# Patient Record
Sex: Female | Born: 1950
Health system: Southern US, Community
[De-identification: ages and names within clinical notes are randomized; demographics above are authoritative.]

## PROBLEM LIST (undated history)

## (undated) DIAGNOSIS — G709 Myoneural disorder, unspecified: Secondary | ICD-10-CM

## (undated) DIAGNOSIS — K589 Irritable bowel syndrome without diarrhea: Secondary | ICD-10-CM

## (undated) DIAGNOSIS — H269 Unspecified cataract: Secondary | ICD-10-CM

## (undated) DIAGNOSIS — M810 Age-related osteoporosis without current pathological fracture: Secondary | ICD-10-CM

## (undated) DIAGNOSIS — L309 Dermatitis, unspecified: Secondary | ICD-10-CM

## (undated) DIAGNOSIS — K635 Polyp of colon: Secondary | ICD-10-CM

## (undated) DIAGNOSIS — G8929 Other chronic pain: Secondary | ICD-10-CM

## (undated) DIAGNOSIS — M8580 Other specified disorders of bone density and structure, unspecified site: Secondary | ICD-10-CM

## (undated) DIAGNOSIS — M1990 Unspecified osteoarthritis, unspecified site: Secondary | ICD-10-CM

## (undated) DIAGNOSIS — R519 Headache, unspecified: Secondary | ICD-10-CM

## (undated) DIAGNOSIS — T7840XA Allergy, unspecified, initial encounter: Secondary | ICD-10-CM

## (undated) DIAGNOSIS — F329 Major depressive disorder, single episode, unspecified: Secondary | ICD-10-CM

## (undated) DIAGNOSIS — E785 Hyperlipidemia, unspecified: Secondary | ICD-10-CM

## (undated) DIAGNOSIS — I10 Essential (primary) hypertension: Secondary | ICD-10-CM

## (undated) DIAGNOSIS — K5790 Diverticulosis of intestine, part unspecified, without perforation or abscess without bleeding: Secondary | ICD-10-CM

## (undated) DIAGNOSIS — R51 Headache: Secondary | ICD-10-CM

## (undated) DIAGNOSIS — F32A Depression, unspecified: Secondary | ICD-10-CM

## (undated) DIAGNOSIS — K219 Gastro-esophageal reflux disease without esophagitis: Secondary | ICD-10-CM

## (undated) DIAGNOSIS — Z720 Tobacco use: Secondary | ICD-10-CM

## (undated) DIAGNOSIS — F419 Anxiety disorder, unspecified: Secondary | ICD-10-CM

## (undated) HISTORY — DX: Other specified disorders of bone density and structure, unspecified site: M85.80

## (undated) HISTORY — DX: Unspecified cataract: H26.9

## (undated) HISTORY — PX: OTHER SURGICAL HISTORY: SHX169

## (undated) HISTORY — DX: Depression, unspecified: F32.A

## (undated) HISTORY — PX: BREAST SURGERY: SHX581

## (undated) HISTORY — DX: Myoneural disorder, unspecified: G70.9

## (undated) HISTORY — DX: Hyperlipidemia, unspecified: E78.5

## (undated) HISTORY — PX: TUBAL LIGATION: SHX77

## (undated) HISTORY — DX: Gastro-esophageal reflux disease without esophagitis: K21.9

## (undated) HISTORY — DX: Polyp of colon: K63.5

## (undated) HISTORY — DX: Other chronic pain: G89.29

## (undated) HISTORY — DX: Major depressive disorder, single episode, unspecified: F32.9

## (undated) HISTORY — DX: Unspecified osteoarthritis, unspecified site: M19.90

## (undated) HISTORY — PX: COLONOSCOPY W/ POLYPECTOMY: SHX1380

## (undated) HISTORY — DX: Dermatitis, unspecified: L30.9

## (undated) HISTORY — PX: TOTAL ABDOMINAL HYSTERECTOMY: SHX209

## (undated) HISTORY — DX: Age-related osteoporosis without current pathological fracture: M81.0

## (undated) HISTORY — DX: Diverticulosis of intestine, part unspecified, without perforation or abscess without bleeding: K57.90

## (undated) HISTORY — PX: SKIN CANCER EXCISION: SHX779

## (undated) HISTORY — DX: Headache, unspecified: R51.9

## (undated) HISTORY — DX: Essential (primary) hypertension: I10

## (undated) HISTORY — DX: Anxiety disorder, unspecified: F41.9

## (undated) HISTORY — DX: Irritable bowel syndrome, unspecified: K58.9

## (undated) HISTORY — DX: Tobacco use: Z72.0

## (undated) HISTORY — DX: Headache: R51

## (undated) HISTORY — DX: Allergy, unspecified, initial encounter: T78.40XA

---

## 2011-11-07 ENCOUNTER — Ambulatory Visit (INDEPENDENT_AMBULATORY_CARE_PROVIDER_SITE_OTHER): Payer: Medicare HMO | Admitting: Family Medicine

## 2011-11-07 VITALS — BP 135/77 | HR 86 | Temp 98.8°F | Resp 16 | Ht 63.0 in | Wt 187.0 lb

## 2011-11-07 DIAGNOSIS — E785 Hyperlipidemia, unspecified: Secondary | ICD-10-CM

## 2011-11-07 DIAGNOSIS — J329 Chronic sinusitis, unspecified: Secondary | ICD-10-CM

## 2011-11-07 DIAGNOSIS — R059 Cough, unspecified: Secondary | ICD-10-CM

## 2011-11-07 DIAGNOSIS — G43909 Migraine, unspecified, not intractable, without status migrainosus: Secondary | ICD-10-CM

## 2011-11-07 DIAGNOSIS — R05 Cough: Secondary | ICD-10-CM

## 2011-11-07 MED ORDER — SUMATRIPTAN SUCCINATE 100 MG PO TABS: 100.0000 mg | ORAL_TABLET | ORAL | Status: DC | PRN

## 2011-11-07 MED ORDER — MOXIFLOXACIN HCL 400 MG PO TABS: 400.0000 mg | ORAL_TABLET | Freq: Every day | ORAL | Status: AC

## 2011-11-07 MED ORDER — ATORVASTATIN CALCIUM 10 MG PO TABS: 10.0000 mg | ORAL_TABLET | Freq: Every day | ORAL | Status: DC

## 2011-11-07 NOTE — Progress Notes (Signed)
[facility]:  Office Visit  Chief Complaint:  Chief Complaint  Patient presents with  . Sinusitis    x 2 weeks    HPI: Melinda Peterson is a 61 y.o. female who complains of  2 week h/o sinus congestion, cough non productive, subject fevers. H/o sinus infection. States that she is allergic to Biaxin and strong family h/o PCN allergy so normally does not take that. She usually is given Avelox by her former PCP from [place] where she recently moved from for any sinus infection. Denies ear pain, sore throat. Has not tried OTC treatment. + smoker. She is not allergic to Bactrim or Doxycycline that she knows of.  Past Medical History  Diagnosis Date  . Allergy   . Anxiety   . Eczema   . Irritable bowel syndrome   . Osteopenia   . Hyperlipidemia    Past Surgical History  Procedure Date  . Abdominal hysterectomy   . Left shoulder surgery   . Colonoscopy w/ polypectomy 2009, 2012   History   Social History  . Marital Status: Married    Spouse Name: N/A    Number of Children: N/A  . Years of Education: N/A   Social History Main Topics  . Smoking status: Current Everyday Smoker -- 0.3 packs/day  . Smokeless tobacco: None  . Alcohol Use: None  . Drug Use: None  . Sexually Active: None   Other Topics Concern  . None   Social History Narrative  . None   No family history on file. Allergies  Allergen Reactions  . Biaxin Hives  . Lincocin Hives  . Lisinopril Swelling   Prior to Admission medications   Medication Sig Start Date End Date Taking? Authorizing Provider  allopurinol (ZYLOPRIM) 300 MG tablet Take 300 mg by mouth daily.   Yes Historical Provider, MD  ALPRAZolam Prudy Feeler) 0.5 MG tablet Take 0.5 mg by mouth 3 (three) times daily as needed.   Yes Historical Provider, MD  atorvastatin (LIPITOR) 10 MG tablet Take 10 mg by mouth daily.   Yes Historical Provider, MD  celecoxib (CELEBREX) 200 MG capsule Take 200 mg by mouth as needed.   Yes Historical  Provider, MD  esomeprazole (NEXIUM) 40 MG capsule Take 40 mg by mouth daily before breakfast.   Yes Historical Provider, MD  FLUoxetine (PROZAC) 40 MG capsule Take 40 mg by mouth daily.   Yes Historical Provider, MD  hyoscyamine (HYOMAX-SR) 0.375 MG 12 hr tablet Take 0.375 mg by mouth every 12 (twelve) hours as needed.   Yes Historical Provider, MD  Melatonin 10 MG CAPS Take 10 mg by mouth at bedtime.   Yes Historical Provider, MD  mometasone (NASONEX) 50 MCG/ACT nasal spray Place 2 sprays into the nose daily.   Yes Historical Provider, MD  Multiple Vitamin (MULTIVITAMIN) capsule Take 1 capsule by mouth daily.   Yes Historical Provider, MD  omeprazole (PRILOSEC) 20 MG capsule Take 20 mg by mouth at bedtime.   Yes Historical Provider, MD  SUMAtriptan (IMITREX) 100 MG tablet Take 100 mg by mouth as needed.   Yes Historical Provider, MD  valsartan-hydrochlorothiazide (DIOVAN-HCT) 160-25 MG per tablet Take 1 tablet by mouth daily.   Yes Historical Provider, MD  zolpidem (AMBIEN) 10 MG tablet Take 10 mg by mouth at bedtime as needed.   Yes Historical Provider, MD     ROS: The patient denies fevers, chills, night sweats, unintentional weight loss, chest pain, palpitations, wheezing, dyspnea on exertion, nausea, vomiting, abdominal  pain, dysuria, hematuria, melena, numbness, weakness, or tingling. + sinus pain  All other systems have been reviewed and were otherwise negative with the exception of those mentioned in the HPI and as above.    PHYSICAL EXAM: Filed Vitals:   11/07/11 1705  BP: 135/77  Pulse: 86  Temp: 98.8 F (37.1 C)  Resp: 16   Filed Vitals:   11/07/11 1705  Height: 5\' 3"  (1.6 m)  Weight: 187 lb (84.823 kg)   Body mass index is 33.13 kg/(m^2).  General: Alert, no acute distress HEENT:  Normocephalic, atraumatic, oropharynx patent. TM nl, OP normal, no exudates, + sinus tenderness Cardiovascular:  Regular rate and rhythm, no rubs murmurs or gallops.  No Carotid bruits,  radial pulse intact. No pedal edema.  Respiratory: Clear to auscultation bilaterally.  No wheezes, rales, or rhonchi.  No cyanosis, no use of accessory musculature GI: No organomegaly, abdomen is soft and non-tender, positive bowel sounds.  No masses. Skin: No rashes. Neurologic: Facial musculature symmetric. Psychiatric: Patient is appropriate throughout our interaction.  Lymphatic: No cervical lymphadenopathy Musculoskeletal: Gait intact.   LABS: No results found for this or any previous visit.   EKG/XRAY:   Primary read interpreted by Dr. Conley Rolls at [facility].   ASSESSMENT/PLAN: Encounter Diagnoses  Name Primary?  . Sinusitis Yes  . Cough   . Hyperlipidemia   . Migraines    1. Sxs treatment for cough and allergies with OTC meds, if no improvement then may take Avelox. I was extremely hesitant to give her the Avelox but she was insistent about it. Has Biaxin allergies, strong family h/o PCN allergy so she does not want to take that. We talked about overuse and abuse of abx and she states she understands it but still persistent that Avelox is the only medication that works for her.  2. Refill LIpitor x 30 d with 1 refill, last CMP in October per patient was normal. Just moved from [place]. I emphasized that unless we get records we are unable to refill her Lipitor again after her 2 month supply runs out. She needs to get a PCP within the area to obtain any other medication refills.  3. Refilled migraine medication 3. F/u with Dr. Audria Nine to establish care.    Hamilton Capri PHUONG, DO 11/07/2011 5:41 PM

## 2011-11-29 ENCOUNTER — Ambulatory Visit (INDEPENDENT_AMBULATORY_CARE_PROVIDER_SITE_OTHER): Payer: Medicare HMO | Admitting: Internal Medicine

## 2011-11-29 VITALS — BP 139/83 | HR 93 | Temp 98.1°F | Resp 18 | Ht 63.0 in | Wt 189.0 lb

## 2011-11-29 DIAGNOSIS — R05 Cough: Secondary | ICD-10-CM

## 2011-11-29 DIAGNOSIS — R059 Cough, unspecified: Secondary | ICD-10-CM

## 2011-11-29 DIAGNOSIS — F411 Generalized anxiety disorder: Secondary | ICD-10-CM

## 2011-11-29 DIAGNOSIS — F39 Unspecified mood [affective] disorder: Secondary | ICD-10-CM

## 2011-11-29 DIAGNOSIS — F32A Depression, unspecified: Secondary | ICD-10-CM | POA: Insufficient documentation

## 2011-11-29 DIAGNOSIS — J069 Acute upper respiratory infection, unspecified: Secondary | ICD-10-CM

## 2011-11-29 DIAGNOSIS — N898 Other specified noninflammatory disorders of vagina: Secondary | ICD-10-CM

## 2011-11-29 DIAGNOSIS — S91109A Unspecified open wound of unspecified toe(s) without damage to nail, initial encounter: Secondary | ICD-10-CM

## 2011-11-29 DIAGNOSIS — R300 Dysuria: Secondary | ICD-10-CM

## 2011-11-29 DIAGNOSIS — I10 Essential (primary) hypertension: Secondary | ICD-10-CM

## 2011-11-29 DIAGNOSIS — M109 Gout, unspecified: Secondary | ICD-10-CM | POA: Insufficient documentation

## 2011-11-29 DIAGNOSIS — F329 Major depressive disorder, single episode, unspecified: Secondary | ICD-10-CM | POA: Insufficient documentation

## 2011-11-29 LAB — POCT URINALYSIS DIPSTICK
Leukocytes, UA: NEGATIVE
Nitrite, UA: NEGATIVE
Protein, UA: NEGATIVE
pH, UA: 6.5

## 2011-11-29 LAB — POCT UA - MICROSCOPIC ONLY
Casts, Ur, LPF, POC: NEGATIVE
Crystals, Ur, HPF, POC: NEGATIVE
Mucus, UA: NEGATIVE
Yeast, UA: NEGATIVE

## 2011-11-29 LAB — POCT WET PREP WITH KOH: Clue Cells Wet Prep HPF POC: NEGATIVE

## 2011-11-29 MED ORDER — MUPIROCIN 2 % EX OINT: TOPICAL_OINTMENT | Freq: Three times a day (TID) | CUTANEOUS | Status: AC

## 2011-11-29 MED ORDER — AZITHROMYCIN 250 MG PO TABS: ORAL_TABLET | ORAL | Status: AC

## 2011-11-29 NOTE — Patient Instructions (Signed)
Wound Care Wound care helps prevent pain and infection.  You may need a tetanus shot if:  You cannot remember when you had your last tetanus shot.   You have never had a tetanus shot.   The injury broke your skin.  If you need a tetanus shot and you choose not to have one, you may get tetanus. Sickness from tetanus can be serious. HOME CARE   Only take medicine as told by your doctor.   Clean the wound daily with mild soap and water.   Change any bandages (dressings) as told by your doctor.   Put medicated cream and a bandage on the wound as told by your doctor.   Change the bandage if it gets wet, dirty, or starts to smell.   Take showers. Do not take baths, swim, or do anything that puts your wound under water.   Rest and raise (elevate) the wound until the pain and puffiness (swelling) are better.   Keep all doctor visits as told.  GET HELP RIGHT AWAY IF:   Yellowish-white fluid (pus) comes from the wound.   Medicine does not lessen your pain.   There is a red streak going away from the wound.   You cannot move your finger or toe.   You have a fever.  MAKE SURE YOU:   Understand these instructions.   Will watch your condition.   Will get help right away if you are not doing well or get worse.  Document Released: 05/08/2008 Document Revised: 07/19/2011 Document Reviewed: 12/03/2010 [employer] Patient Information 2012 [employer]. 

## 2011-11-29 NOTE — Progress Notes (Signed)
  Subjective:    Patient ID: Melinda Peterson, female    DOB: 1951-02-21, 61 y.o.   MRN: [id number]  HPI Has pressure over bladder and scant vag. Discharge. No fever, is traveling and does not want to be sick. Also caught 5th toe on chair leg and nail avulsed dorsal with bleeding.   Review of Systems complex, see meds and problem list.   Objective:   Physical Exam 5th toe wound with nail avulsion Tender over bladder No other issues Results for orders placed in visit on 11/29/11  POCT URINALYSIS DIPSTICK      Component Value Range   Color, UA yellow     Clarity, UA clear     Glucose, UA neg     Bilirubin, UA neg     Ketones, UA neg     Spec Grav, UA 1.010     Blood, UA neg     pH, UA 6.5     Protein, UA neg     Urobilinogen, UA 0.2     Nitrite, UA neg     Leukocytes, UA Negative    POCT UA - MICROSCOPIC ONLY      Component Value Range   WBC, Ur, HPF, POC neg     RBC, urine, microscopic neg     Bacteria, U Microscopic neg     Mucus, UA neg     Epithelial cells, urine per micros 0-1     Crystals, Ur, HPF, POC neg     Casts, Ur, LPF, POC neg     Yeast, UA neg    POCT WET PREP WITH KOH      Component Value Range   Trichomonas, UA Negative     Clue Cells Wet Prep HPF POC neg     Epithelial Wet Prep HPF POC 10-20     Yeast Wet Prep HPF POC neg     Bacteria Wet Prep HPF POC 2+     RBC Wet Prep HPF POC 1-2     WBC Wet Prep HPF POC 0-1     KOH Prep POC Negative           Assessment & Plan:  Wound cleaned mupirocin and covered. Wet prep and CCUA are both normal Zpak

## 2011-12-20 ENCOUNTER — Ambulatory Visit (INDEPENDENT_AMBULATORY_CARE_PROVIDER_SITE_OTHER): Payer: Self-pay | Admitting: Family Medicine

## 2011-12-20 VITALS — BP 147/91 | HR 106 | Temp 98.3°F | Resp 20 | Ht 63.0 in | Wt 187.8 lb

## 2011-12-20 DIAGNOSIS — E785 Hyperlipidemia, unspecified: Secondary | ICD-10-CM

## 2011-12-20 DIAGNOSIS — K7689 Other specified diseases of liver: Secondary | ICD-10-CM

## 2011-12-20 DIAGNOSIS — K769 Liver disease, unspecified: Secondary | ICD-10-CM

## 2011-12-20 DIAGNOSIS — E66811 Obesity, class 1: Secondary | ICD-10-CM

## 2011-12-20 DIAGNOSIS — J329 Chronic sinusitis, unspecified: Secondary | ICD-10-CM

## 2011-12-20 DIAGNOSIS — F418 Other specified anxiety disorders: Secondary | ICD-10-CM

## 2011-12-20 DIAGNOSIS — I10 Essential (primary) hypertension: Secondary | ICD-10-CM

## 2011-12-20 DIAGNOSIS — M5030 Other cervical disc degeneration, unspecified cervical region: Secondary | ICD-10-CM

## 2011-12-20 DIAGNOSIS — K5730 Diverticulosis of large intestine without perforation or abscess without bleeding: Secondary | ICD-10-CM

## 2011-12-20 DIAGNOSIS — E669 Obesity, unspecified: Secondary | ICD-10-CM

## 2011-12-20 DIAGNOSIS — K589 Irritable bowel syndrome without diarrhea: Secondary | ICD-10-CM

## 2011-12-20 DIAGNOSIS — F341 Dysthymic disorder: Secondary | ICD-10-CM

## 2011-12-20 DIAGNOSIS — K219 Gastro-esophageal reflux disease without esophagitis: Secondary | ICD-10-CM

## 2011-12-20 DIAGNOSIS — Z760 Encounter for issue of repeat prescription: Secondary | ICD-10-CM

## 2011-12-20 MED ORDER — HYOSCYAMINE SULFATE ER 0.375 MG PO TB12: 0.3750 mg | ORAL_TABLET | Freq: Two times a day (BID) | ORAL | Status: DC | PRN

## 2011-12-20 MED ORDER — ZOLPIDEM TARTRATE 10 MG PO TABS: 10.0000 mg | ORAL_TABLET | Freq: Every evening | ORAL | Status: DC | PRN

## 2011-12-20 MED ORDER — VALSARTAN-HYDROCHLOROTHIAZIDE 160-25 MG PO TABS: 1.0000 | ORAL_TABLET | Freq: Every day | ORAL | Status: DC

## 2011-12-20 MED ORDER — ALPRAZOLAM 0.5 MG PO TABS: 0.5000 mg | ORAL_TABLET | Freq: Three times a day (TID) | ORAL | Status: DC | PRN

## 2011-12-20 MED ORDER — SULFAMETHOXAZOLE-TRIMETHOPRIM 800-160 MG PO TABS: 1.0000 | ORAL_TABLET | Freq: Two times a day (BID) | ORAL | Status: AC

## 2011-12-20 MED ORDER — ATORVASTATIN CALCIUM 10 MG PO TABS: 10.0000 mg | ORAL_TABLET | Freq: Every day | ORAL | Status: DC

## 2011-12-20 NOTE — Patient Instructions (Signed)
Allergic Rhinitis  Allergic rhinitis is when the mucous membranes in the nose respond to allergens. Allergens are particles in the air that cause your body to have an allergic reaction. This causes you to release allergic antibodies. Through a chain of events, these eventually cause you to release histamine into the blood stream (hence the use of antihistamines). Although meant to be protective to the body, it is this release that causes your discomfort, such as frequent sneezing, congestion and an itchy runny nose.    CAUSES    The pollen allergens may come from grasses, trees, and weeds. This is seasonal allergic rhinitis, or "hay fever." Other allergens cause year-round allergic rhinitis (perennial allergic rhinitis) such as house dust mite allergen, pet dander and mold spores.    SYMPTOMS     Nasal stuffiness (congestion).   Runny, itchy nose with sneezing and tearing of the eyes.   There is often an itching of the mouth, eyes and ears.  It cannot be cured, but it can be controlled with medications.  DIAGNOSIS    If you are unable to determine the offending allergen, skin or blood testing may find it.  TREATMENT     Avoid the allergen.   Medications and allergy shots (immunotherapy) can help.   Hay fever may often be treated with antihistamines in pill or nasal spray forms. Antihistamines block the effects of histamine. There are over-the-counter medicines that may help with nasal congestion and swelling around the eyes. Check with your caregiver before taking or giving this medicine.  If the treatment above does not work, there are many new medications your caregiver can prescribe. Stronger medications may be used if initial measures are ineffective. Desensitizing injections can be used if medications and avoidance fails. Desensitization is when a patient is given ongoing shots until the body becomes less sensitive to the allergen. Make sure you follow up with your caregiver if problems continue.  SEEK  MEDICAL CARE IF:     You develop fever (more than 100.5 F (38.1 C).   You develop a cough that does not stop easily (persistent).   You have shortness of breath.   You start wheezing.   Symptoms interfere with normal daily activities.  Document Released: 04/24/2001 Document Revised: 07/19/2011 Document Reviewed: 11/03/2008  [employer] Patient Information 2012 [employer].

## 2011-12-21 LAB — COMPREHENSIVE METABOLIC PANEL
ALT: 18 U/L (ref 0–35)
CO2: 24 mEq/L (ref 19–32)
Calcium: 9.6 mg/dL (ref 8.4–10.5)
Chloride: 103 mEq/L (ref 96–112)
Sodium: 139 mEq/L (ref 135–145)
Total Bilirubin: 0.5 mg/dL (ref 0.3–1.2)
Total Protein: 6.8 g/dL (ref 6.0–8.3)

## 2011-12-23 NOTE — Progress Notes (Signed)
Quick Note:  Please notify pt that results are normal.   Provide pt with copy of labs. ______ 

## 2011-12-24 ENCOUNTER — Encounter: Payer: Self-pay | Admitting: Radiology

## 2011-12-26 ENCOUNTER — Encounter: Payer: Self-pay | Admitting: Family Medicine

## 2011-12-26 DIAGNOSIS — M5030 Other cervical disc degeneration, unspecified cervical region: Secondary | ICD-10-CM | POA: Insufficient documentation

## 2011-12-26 DIAGNOSIS — E669 Obesity, unspecified: Secondary | ICD-10-CM | POA: Insufficient documentation

## 2011-12-26 DIAGNOSIS — K219 Gastro-esophageal reflux disease without esophagitis: Secondary | ICD-10-CM | POA: Insufficient documentation

## 2011-12-26 DIAGNOSIS — K589 Irritable bowel syndrome without diarrhea: Secondary | ICD-10-CM | POA: Insufficient documentation

## 2011-12-26 DIAGNOSIS — K5730 Diverticulosis of large intestine without perforation or abscess without bleeding: Secondary | ICD-10-CM | POA: Insufficient documentation

## 2011-12-26 NOTE — Progress Notes (Signed)
Subjective:    Patient ID: Melinda Peterson, female    DOB: 11/26/1950, 61 y.o.   MRN: [id number]  HPI  This 61 y.o. Cauc female has bee seen twice at [facility] and is here at 89 today to establish primary  care, have medications reviewed and follow-up on sinus symptoms (pt is sure she has another sinus infection  She also wants to discuss some chronic medical issues that need further evaluation but are being postponed  due to financial constraints.These problems were initially diagnosed in [place]. Pt's mother recently died in 2023/01/07  and the pt is working through financial issues with sister and other family members.          She has has chronic abdominal pain, initially evaluated with CT 11/03/2010. MRI done Jan 07, 2011 shows  largest lesion probably is a benign liver lesion such as hemangioma but it is unclear what the right lobe  lesions are; she would like to get another MRI when she can afford it. She also has chronic neck pain and C-spine imaging  shows DDD/ disc protrusion. She also has chronic right knee pain and xray done 08/2010 shows no abnormality.    Review of Systems  Constitutional: Positive for fatigue. Negative for fever, activity change, appetite change and unexpected weight change.  HENT: Positive for congestion, rhinorrhea, sneezing, neck pain and sinus pressure. Negative for nosebleeds, sore throat, voice change and postnasal drip.   Eyes: Positive for itching. Negative for redness.  Respiratory: Positive for shortness of breath. Negative for cough, chest tightness and wheezing.   Cardiovascular: Negative for chest pain.  Gastrointestinal:       Chronic GI symptoms/ Diverticulosis, colon polyps, hemorrhoids, IBS and GERD  Musculoskeletal: Positive for back pain and arthralgias.  Neurological: Positive for headaches. Negative for dizziness, weakness and light-headedness.  Psychiatric/Behavioral: Positive for dysphoric mood. The patient is nervous/anxious.        Objective:   Physical Exam  Nursing note and vitals reviewed. Constitutional: She is oriented to person, place, and time. She appears well-developed and well-nourished. No distress.  HENT:  Head: Normocephalic and atraumatic.  Right Ear: External ear normal.  Left Ear: External ear normal.  Nose: Nose normal.  Mouth/Throat: Oropharynx is clear and moist. No oropharyngeal exudate.       Mild erythema of posterior pharynx;  Sinuses nontender with percusiion  Eyes: EOM are normal. Pupils are equal, round, and reactive to light. Right eye exhibits no discharge. Left eye exhibits no discharge. No scleral icterus.       Conj injected  Neck: No thyromegaly present.       C-spine: decreased ROM  Cardiovascular: Normal rate.   Pulmonary/Chest: Effort normal and breath sounds normal. No respiratory distress. She has no wheezes.       BS distant at bases  Musculoskeletal: Normal range of motion.  Lymphadenopathy:    She has no cervical adenopathy.  Neurological: She is alert and oriented to person, place, and time. No cranial nerve deficit.  Skin: Skin is warm and dry. No erythema. No pallor.  Psychiatric: Her behavior is normal. Thought content normal.       Very talkative and tangential (wanting to discuss issues related to  Mother's death and strife within the family)- gets mildly  agitated with this discussion          Assessment & Plan:   1. Hyperlipidemia  Continue current statin (Atorvastatin)  2. HTN (hypertension) - Controlled Comprehensive metabolic panel RF: Diovan HCT (generic); weight reduction  3. Liver lesion  CMET: will discuss repeat MRI imaging in future when pt can afford it  4. Medication refill  See medication list  5. Chronic sinusitis  RX: Septra DS  1 tab bid for 10 days; Nasonex Try NETI pot  6. Depression with anxiety  RF: Alprazolam; continue Fluoxetine and Zolpidem

## 2012-01-04 ENCOUNTER — Encounter: Payer: Self-pay | Admitting: Family Medicine

## 2012-01-04 ENCOUNTER — Ambulatory Visit (INDEPENDENT_AMBULATORY_CARE_PROVIDER_SITE_OTHER): Payer: 59 | Admitting: Family Medicine

## 2012-01-04 VITALS — BP 99/73 | HR 100 | Temp 98.7°F | Resp 18 | Ht 63.0 in | Wt 188.0 lb

## 2012-01-04 DIAGNOSIS — F411 Generalized anxiety disorder: Secondary | ICD-10-CM

## 2012-01-04 DIAGNOSIS — J329 Chronic sinusitis, unspecified: Secondary | ICD-10-CM

## 2012-01-04 DIAGNOSIS — Z760 Encounter for issue of repeat prescription: Secondary | ICD-10-CM

## 2012-01-04 DIAGNOSIS — F419 Anxiety disorder, unspecified: Secondary | ICD-10-CM

## 2012-01-04 MED ORDER — CELECOXIB 200 MG PO CAPS: 200.0000 mg | ORAL_CAPSULE | ORAL | Status: DC | PRN

## 2012-01-04 MED ORDER — HYOSCYAMINE SULFATE ER 0.375 MG PO TB12: 0.3750 mg | ORAL_TABLET | Freq: Two times a day (BID) | ORAL | Status: DC | PRN

## 2012-01-04 MED ORDER — LEVOFLOXACIN 500 MG PO TABS: 500.0000 mg | ORAL_TABLET | Freq: Every day | ORAL | Status: AC

## 2012-01-04 NOTE — Progress Notes (Signed)
  Subjective:    Patient ID: Melinda Peterson, female    DOB: 05/30/1951, 61 y.o.   MRN: [id number]  HPI  Pt returns after finishing 10-day antibiotic (Generic Septra DS). She was briefly improved but stress  with family matters surrounding mother's death negatively impacted health. She has tried to improve  sleep hygiene to 5-6 hours/night. Having sinus congestion, low-grade fever and chills with non-productive  cough. Has not used NETI pot yet. She was treated with Avelox in March and Septra DS 2 weeks ago.   She has several antibiotic-class allergies.     Review of Systems    As per HPI     Objective:   Physical Exam  Vitals reviewed. Constitutional: She is oriented to person, place, and time. She appears well-developed and well-nourished. No distress.  HENT:  Head: Normocephalic and atraumatic.  Right Ear: External ear normal.  Left Ear: External ear normal.  Mouth/Throat: No oropharyngeal exudate.       Nasal turbinates and post pharynx erythematous  Eyes: Conjunctivae and EOM are normal. No scleral icterus.  Neck: Neck supple. No thyromegaly present.  Cardiovascular: Normal rate.   Pulmonary/Chest: Effort normal. No respiratory distress.  Lymphadenopathy:    She has no cervical adenopathy.  Neurological: She is alert and oriented to person, place, and time.  Skin: Skin is warm and dry.  Psychiatric: Thought content normal. Her mood appears anxious. Her affect is not angry and not labile. Her speech is rapid and/or pressured. Cognition and memory are normal.          Assessment & Plan:   1. Sinusitis, chronic    Pt has several antibiotic-class allergies; RX: Levaquin 500 mg  1 tablet once daily for 10 days  2. Chronic anxiety    Pt continues to have stress as she copes with family/ financial issues and mother's death  3. Medication refill    Pt requests RFs: Hyoscyamine and Celebrex

## 2012-01-04 NOTE — Patient Instructions (Signed)
Levaquin 500 mg  1 tablet daily has been prescribed for sinusitis. It is important that you try the other measures advised to prevent recurrence of infection. You may also consider using a different antihistamine (OTC Zyrtec or Allegra) for chronic  allergies.       Sinusitis Sinuses are air pockets within the bones of your face. The growth of bacteria within a sinus leads to infection. The infection prevents the sinuses from draining. This infection is called sinusitis. SYMPTOMS  There will be different areas of pain depending on which sinuses have become infected.  The maxillary sinuses often produce pain beneath the eyes.   Frontal sinusitis may cause pain in the middle of the forehead and above the eyes.  Other problems (symptoms) include:  Toothaches.   Colored, pus-like (purulent) drainage from the nose.   Swelling, warmth, and tenderness over the sinus areas may be signs of infection.  TREATMENT  Sinusitis is most often determined by an exam.X-rays may be taken. If x-rays have been taken, make sure you obtain your results or find out how you are to obtain them. Your caregiver may give you medications (antibiotics). These are medications that will help kill the bacteria causing the infection. You may also be given a medication (decongestant) that helps to reduce sinus swelling.  HOME CARE INSTRUCTIONS   Only take over-the-counter or prescription medicines for pain, discomfort, or fever as directed by your caregiver.   Drink extra fluids. Fluids help thin the mucus so your sinuses can drain more easily.   Applying either moist heat or ice packs to the sinus areas may help relieve discomfort.   Use saline nasal sprays to help moisten your sinuses. The sprays can be found at your local drugstore.  SEEK IMMEDIATE MEDICAL CARE IF:  You have a fever.   You have increasing pain, severe headaches, or toothache.   You have nausea, vomiting, or drowsiness.   You develop unusual  swelling around the face or trouble seeing.  MAKE SURE YOU:   Understand these instructions.   Will watch your condition.   Will get help right away if you are not doing well or get worse.  Document Released: 07/30/2005 Document Revised: 07/19/2011 Document Reviewed: 02/26/2007 [facility] Patient Information 2012 [place], [place].

## 2012-01-06 ENCOUNTER — Encounter: Payer: Self-pay | Admitting: Family Medicine

## 2012-01-06 DIAGNOSIS — J329 Chronic sinusitis, unspecified: Secondary | ICD-10-CM | POA: Insufficient documentation

## 2012-02-04 ENCOUNTER — Other Ambulatory Visit: Payer: Self-pay | Admitting: Family Medicine

## 2012-02-04 MED ORDER — ALPRAZOLAM 0.5 MG PO TABS: 0.5000 mg | ORAL_TABLET | Freq: Three times a day (TID) | ORAL | Status: DC | PRN

## 2012-02-17 ENCOUNTER — Other Ambulatory Visit: Payer: Self-pay | Admitting: Family Medicine

## 2012-02-26 ENCOUNTER — Ambulatory Visit (INDEPENDENT_AMBULATORY_CARE_PROVIDER_SITE_OTHER): Payer: 59 | Admitting: Family Medicine

## 2012-02-26 ENCOUNTER — Encounter: Payer: Self-pay | Admitting: Family Medicine

## 2012-02-26 VITALS — BP 133/93 | HR 99 | Temp 98.5°F | Resp 16 | Ht 62.5 in | Wt 194.0 lb

## 2012-02-26 DIAGNOSIS — F418 Other specified anxiety disorders: Secondary | ICD-10-CM

## 2012-02-26 DIAGNOSIS — M6530 Trigger finger, unspecified finger: Secondary | ICD-10-CM

## 2012-02-26 DIAGNOSIS — D1803 Hemangioma of intra-abdominal structures: Secondary | ICD-10-CM

## 2012-02-26 DIAGNOSIS — G43909 Migraine, unspecified, not intractable, without status migrainosus: Secondary | ICD-10-CM

## 2012-02-26 DIAGNOSIS — M25519 Pain in unspecified shoulder: Secondary | ICD-10-CM

## 2012-02-26 DIAGNOSIS — F341 Dysthymic disorder: Secondary | ICD-10-CM

## 2012-02-26 DIAGNOSIS — M25512 Pain in left shoulder: Secondary | ICD-10-CM

## 2012-02-26 DIAGNOSIS — M65332 Trigger finger, left middle finger: Secondary | ICD-10-CM

## 2012-02-26 MED ORDER — ZOLPIDEM TARTRATE 10 MG PO TABS: 10.0000 mg | ORAL_TABLET | Freq: Every evening | ORAL | Status: DC | PRN

## 2012-02-26 MED ORDER — SUMATRIPTAN SUCCINATE 100 MG PO TABS: 100.0000 mg | ORAL_TABLET | ORAL | Status: DC | PRN

## 2012-02-26 MED ORDER — ALPRAZOLAM 0.5 MG PO TABS: 0.5000 mg | ORAL_TABLET | Freq: Three times a day (TID) | ORAL | Status: DC | PRN

## 2012-02-26 MED ORDER — FLUOXETINE HCL 40 MG PO CAPS: 40.0000 mg | ORAL_CAPSULE | Freq: Every day | ORAL | Status: DC

## 2012-02-26 NOTE — Progress Notes (Signed)
Subjective:    Patient ID: Melinda Peterson, female    DOB: October 19, 1950, 61 y.o.   MRN: [id number]  HPI  This 61 y.o. Cauc female has several chronic medical problems and is a self-proclaimed "hypochondriac".  Sh is here today to have medications refilled and to have some sub-acute issues addressed. Family is still dealing with  issues pertaining to death of her mother; final autopsy report is pending.   Pt had left shoulder surgery ("acromioclavicular plasty") in 2006- 2007. Pt was sent to rehab but did not complete treatment.  (Not interested in P.T. at this time.)  Has decreased ROM and pain. Woke up hurting for the last 4-6 weeks.  Heat helps a little. Also has had eval by Neurologist in March 2011 and will probably need MRI of neck in near future.   C/o left middle finger popping and catching like a "trigger finger". Has a hx of CTS years ago.  Takes Celebrex almost daily with food (did not take last week because of GI upset).   Also had problems with stomach last week; has IBS which flares up with nausea and constipation. Last 6 weeks,  pt has been consuming a lot of sweets. Now trying to get nutrition back on tract.   Had an abnormal CT abdomen in 2012; wants to proceed with more specific imaging (MRI suggested by M.D. last year) to  evaluate questionable hemangioma seen in liver.   Migraine disorder associated with chronic neck problems. "Imitrex is a wonder drug".  Review of Systems  Constitutional: Positive for fatigue. Negative for fever, chills and diaphoresis.  HENT: Positive for congestion, neck pain, neck stiffness and sinus pressure. Negative for sore throat and trouble swallowing.   Cardiovascular: Negative.   Gastrointestinal: Positive for nausea, abdominal pain and diarrhea. Negative for vomiting, constipation and blood in stool.  Musculoskeletal: Positive for arthralgias.  Neurological: Negative.   Psychiatric/Behavioral: Positive for disturbed wake/sleep cycle. The  patient is nervous/anxious.    As per HPI     Objective:   Physical Exam  Nursing note and vitals reviewed. Constitutional: She is oriented to person, place, and time. She appears well-developed and well-nourished. No distress.  HENT:  Head: Normocephalic and atraumatic.  Eyes: Conjunctivae and EOM are normal. No scleral icterus.  Neck: Spinous process tenderness and muscular tenderness present. Decreased range of motion present.  Cardiovascular: Normal rate.   Pulmonary/Chest: Effort normal. No respiratory distress.  Musculoskeletal:       Left shoulder: She exhibits decreased range of motion, tenderness, crepitus and pain. She exhibits no bony tenderness, no swelling, no effusion, no deformity, no spasm and normal strength.       Hands: normal ROM in wrists and digits; left middle finger is minimally tender at PIP joint; no redness or effusion. No trigger action noted on exam today.  Neurological: She is alert and oriented to person, place, and time. No cranial nerve deficit. Coordination normal.  Skin: Skin is warm and dry. No erythema.  Psychiatric: She has a normal mood and affect. Her behavior is normal.          Assessment & Plan:   1. Shoulder pain, left  Continue conservative treatment; avoid heavy lifting or overuse.  2. Trigger middle finger of left hand  Monitor- not severe enough to warrant referral to Ortho/ Hand Specialist  3. Depression with anxiety - Mood disorder Continue current medications  4. Hemangioma of liver  MR Abdomen W Contrast  5. Migraines  SUMAtriptan (IMITREX) 100 MG  tablet; minimize triggers ( family stress and chronic anxiety + poor sleep hygiene)

## 2012-02-29 NOTE — Addendum Note (Signed)
Addended by: Thelma Barge D on: 02/29/2012 10:52 AM   Modules accepted: Orders

## 2012-04-01 ENCOUNTER — Other Ambulatory Visit: Payer: Self-pay | Admitting: Family Medicine

## 2012-04-02 NOTE — Telephone Encounter (Signed)
Ok through 06/2012?

## 2012-04-23 ENCOUNTER — Other Ambulatory Visit: Payer: Self-pay | Admitting: Family Medicine

## 2012-04-23 MED ORDER — ALPRAZOLAM 0.5 MG PO TABS: 0.5000 mg | ORAL_TABLET | Freq: Three times a day (TID) | ORAL | Status: DC | PRN

## 2012-04-25 ENCOUNTER — Telehealth: Payer: Self-pay

## 2012-04-25 MED ORDER — VALSARTAN-HYDROCHLOROTHIAZIDE 160-25 MG PO TABS: 1.0000 | ORAL_TABLET | Freq: Every day | ORAL | Status: DC

## 2012-04-25 NOTE — Telephone Encounter (Signed)
Pharmacy called to ask for RF of pt's valsrtn/HCTZ. I asked them to send through Surescripts and they stated they would resend, but we did not receive it. I am sending in RFs for pt.

## 2012-04-30 ENCOUNTER — Telehealth: Payer: Self-pay

## 2012-04-30 ENCOUNTER — Ambulatory Visit (INDEPENDENT_AMBULATORY_CARE_PROVIDER_SITE_OTHER): Payer: 59 | Admitting: Family Medicine

## 2012-04-30 VITALS — BP 164/90 | HR 89 | Temp 98.2°F | Resp 20 | Ht 63.5 in | Wt 192.0 lb

## 2012-04-30 DIAGNOSIS — F341 Dysthymic disorder: Secondary | ICD-10-CM

## 2012-04-30 DIAGNOSIS — Z0000 Encounter for general adult medical examination without abnormal findings: Secondary | ICD-10-CM

## 2012-04-30 DIAGNOSIS — Z01419 Encounter for gynecological examination (general) (routine) without abnormal findings: Secondary | ICD-10-CM

## 2012-04-30 DIAGNOSIS — Z124 Encounter for screening for malignant neoplasm of cervix: Secondary | ICD-10-CM

## 2012-04-30 DIAGNOSIS — F418 Other specified anxiety disorders: Secondary | ICD-10-CM

## 2012-04-30 DIAGNOSIS — N63 Unspecified lump in unspecified breast: Secondary | ICD-10-CM

## 2012-04-30 DIAGNOSIS — N6320 Unspecified lump in the left breast, unspecified quadrant: Secondary | ICD-10-CM

## 2012-04-30 DIAGNOSIS — J019 Acute sinusitis, unspecified: Secondary | ICD-10-CM

## 2012-04-30 DIAGNOSIS — E785 Hyperlipidemia, unspecified: Secondary | ICD-10-CM

## 2012-04-30 LAB — POCT URINALYSIS DIPSTICK
Bilirubin, UA: NEGATIVE
Blood, UA: NEGATIVE
Glucose, UA: NEGATIVE
Spec Grav, UA: 1.015
pH, UA: 6

## 2012-04-30 LAB — CBC WITH DIFFERENTIAL/PLATELET
Basophils Absolute: 0 10*3/uL (ref 0.0–0.1)
Basophils Relative: 0 % (ref 0–1)
Eosinophils Absolute: 0.3 10*3/uL (ref 0.0–0.7)
MCH: 29.5 pg (ref 26.0–34.0)
MCHC: 33.2 g/dL (ref 30.0–36.0)
Neutrophils Relative %: 50 % (ref 43–77)
Platelets: 339 10*3/uL (ref 150–400)
RBC: 4.1 MIL/uL (ref 3.87–5.11)

## 2012-04-30 LAB — COMPREHENSIVE METABOLIC PANEL
ALT: 20 U/L (ref 0–35)
Alkaline Phosphatase: 105 U/L (ref 39–117)
Sodium: 140 mEq/L (ref 135–145)
Total Bilirubin: 0.6 mg/dL (ref 0.3–1.2)
Total Protein: 6.5 g/dL (ref 6.0–8.3)

## 2012-04-30 LAB — LIPID PANEL
LDL Cholesterol: 71 mg/dL (ref 0–99)
VLDL: 60 mg/dL — ABNORMAL HIGH (ref 0–40)

## 2012-04-30 MED ORDER — ATORVASTATIN CALCIUM 10 MG PO TABS: 10.0000 mg | ORAL_TABLET | Freq: Every day | ORAL | Status: DC

## 2012-04-30 MED ORDER — ZOLPIDEM TARTRATE 10 MG PO TABS: 10.0000 mg | ORAL_TABLET | Freq: Every evening | ORAL | Status: DC | PRN

## 2012-04-30 MED ORDER — LEVOFLOXACIN 500 MG PO TABS: 500.0000 mg | ORAL_TABLET | Freq: Every day | ORAL | Status: DC

## 2012-05-01 ENCOUNTER — Encounter: Payer: Self-pay | Admitting: Family Medicine

## 2012-05-01 NOTE — Progress Notes (Signed)
Subjective:    Patient ID: Melinda Peterson, female    DOB: 1951-04-27, 61 y.o.   MRN: [id number]  HPI This 61 y.o. Cauc female is here for CPE/PAP ( last PAP Oct 2102 per pt). She has multiple   chronic medical issues but we will address only the acute problems today and briefly review any  issues that may need action today.   Pt needs MMG; has a hx of breast mass with last study in [place].   Last DEXA: Oct 2102 in [place]   Last CRS: Feb 2012 to recheck polyps (normal)   Pt has a hepatic lesion and was supposed to have a follow-up MRI of liver; she did not have this done because of family obligations/ wedding.   She has HTN and feels like BP has been up lately due to ongoing family stress surrounding  mother's death. Still has not "shed a tear" and acknowledges that she is not grieving; trying to  handle dysfunctional family relationships- "I am the one that tries to be strong for everyone else".   Recent wedding in family had siblings together with less than pleasant outcome (conflicts surfaced).    Pt was in [facility] for above-mentioned wedding and now has increased allergy symptoms  related to wet pine needles. She has "another sinus infection". Levaquin worked well for her last  time and she is requesting this med again.     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 She is a smoker and a social drinker.    Review of Systems  Constitutional: Positive for fever, chills and fatigue. Negative for diaphoresis, activity change and unexpected weight change.  HENT: Positive for rhinorrhea, sneezing, neck pain, neck stiffness, dental problem and sinus pressure. Negative for nosebleeds, congestion, sore throat and voice change.   Eyes: Positive for pain and itching.  Respiratory: Positive for shortness of breath. Negative for cough, chest tightness and wheezing.   Cardiovascular: Positive for palpitations and leg swelling.       She attributes Palpitations to "stress"    Gastrointestinal: Positive for nausea, abdominal pain, diarrhea, constipation and abdominal distention.       Pt has IBS  Genitourinary: Negative.   Musculoskeletal: Positive for myalgias, back pain, joint swelling, arthralgias and gait problem.  Skin: Positive for rash.       Has eczema  Neurological: Positive for dizziness, weakness, light-headedness and headaches. Negative for tremors, syncope and numbness.       Weakness in hands only  Hematological: Bruises/bleeds easily.  Psychiatric/Behavioral: Positive for disturbed wake/sleep cycle, dysphoric mood and decreased concentration. Negative for suicidal ideas, hallucinations, confusion and agitation. The patient is nervous/anxious.        Objective:   Physical Exam  Nursing note and vitals reviewed. Constitutional: She is oriented to person, place, and time. Vital signs are normal. She appears well-developed and well-nourished. No distress.       Appears older than stated age  HENT:  Head: Normocephalic and atraumatic.  Right Ear: Hearing, tympanic membrane, external ear and ear canal normal. Tympanic membrane is not scarred. No middle ear effusion.  Left Ear: Hearing, tympanic membrane and ear canal normal. Tympanic membrane is not scarred.  No middle ear effusion.  Nose: No mucosal edema, rhinorrhea, nasal deformity or septal deviation. Right sinus exhibits maxillary sinus tenderness. Right sinus exhibits no frontal sinus tenderness. Left sinus exhibits maxillary sinus tenderness. Left sinus exhibits no frontal sinus tenderness.  Mouth/Throat: Uvula is midline and mucous membranes are normal.  No oral lesions. Normal dentition. No uvula swelling. Posterior oropharyngeal erythema present. No oropharyngeal exudate.  Eyes: Conjunctivae normal, EOM and lids are normal. Pupils are equal, round, and reactive to light. No scleral icterus.  Neck: Normal range of motion. Neck supple. No JVD present. No thyromegaly present.  Cardiovascular:  Normal rate, regular rhythm and normal heart sounds.  Exam reveals no gallop and no friction rub.   No murmur heard.      Distal pulses 1+/=  Pulmonary/Chest: Effort normal and breath sounds normal. No respiratory distress. She has no wheezes. Right breast exhibits no inverted nipple, no mass, no nipple discharge, no skin change and no tenderness. Left breast exhibits no inverted nipple, no mass, no nipple discharge, no skin change and no tenderness. Breasts are symmetrical.  Abdominal: Normal appearance. She exhibits distension. She exhibits no abdominal bruit, no ascites, no pulsatile midline mass and no mass. Bowel sounds are decreased. There is no hepatosplenomegaly. There is tenderness in the epigastric area and left upper quadrant. There is no rigidity, no guarding and no CVA tenderness. Hernia confirmed negative in the ventral area.  Genitourinary: Vagina normal. Rectal exam shows internal hemorrhoid. Rectal exam shows no external hemorrhoid, no fissure, no mass and anal tone normal. Guaiac negative stool. There is no rash, tenderness or lesion on the right labia. There is no rash, tenderness or lesion on the left labia. Right adnexum displays no mass and no tenderness. Left adnexum displays no mass and no tenderness. No erythema or tenderness around the vagina. No vaginal discharge found.       Cervix and uterus absent; vaginal cuff intact  Musculoskeletal: Normal range of motion. She exhibits no tenderness.       Trace pedal edema; mild deg changes in hands/wrists, knees and ankles.  Lymphadenopathy:    She has no cervical adenopathy.       Right: No inguinal adenopathy present.       Left: No inguinal adenopathy present.  Neurological: She is alert and oriented to person, place, and time. She has normal strength. No cranial nerve deficit or sensory deficit. Coordination and gait normal.  Reflex Scores:      Tricep reflexes are 1+ on the right side and 1+ on the left side.      Bicep  reflexes are 1+ on the right side and 1+ on the left side.      Patellar reflexes are 1+ on the right side and 1+ on the left side. Skin: Skin is warm and dry. Rash noted. No erythema.       Rash c/w eczema; dry skin texture noted  Psychiatric: Judgment and thought content normal. Her mood appears anxious. Her affect is not labile. Her speech is tangential. Her speech is not rapid and/or pressured. She is agitated and is hyperactive. She is not actively hallucinating. Cognition and memory are normal. She exhibits a depressed mood. She expresses no suicidal ideation. She is attentive.    ECG: NSR; no significant ST-TW chnages. No ectopy.      Assessment & Plan:   1. Routine general medical examination at a health care facility - Healthy Living Guidelines given to pt CBC with Differential, Comprehensive metabolic panel, Lipid panel, TSH, Vitamin D 25 hydroxy  2. Encounter for cervical Pap smear with pelvic exam  Pap IG (Image Guided)  3. Depression with anxiety - major source of stress is family dynamics and recent loss of mother Ambulatory referral to Psychology Continue current medications (Fluoxetine, Alprazolam, Zolpidem and  Melatonin for sleep)  4. Sinusitis, acute  RX: Levaquin x 10 days Continue Nasonex and Loratidine (Mucinex DM prn) Avoid allergens  5. Breast mass, left - benign lesion per pt MM Digital Diagnostic Bilat  6. Hyperlipidemia  atorvastatin (LIPITOR) 10 MG tablet

## 2012-05-02 LAB — PAP IG (IMAGE GUIDED)

## 2012-05-04 ENCOUNTER — Encounter: Payer: Self-pay | Admitting: *Deleted

## 2012-05-04 NOTE — Progress Notes (Signed)
Quick Note:  Please call pt and advise that the following labs are abnormal... All labs look very good except Triglycerides are above normal (about 150 points too high). Try to improve nutrition, reduce processed foods in diet and stay fairly active. Thyroid and Vitamin D levels are good.  PAP is negative.  Copy to pt. ______

## 2012-05-06 ENCOUNTER — Other Ambulatory Visit: Payer: Self-pay

## 2012-05-06 DIAGNOSIS — N6320 Unspecified lump in the left breast, unspecified quadrant: Secondary | ICD-10-CM

## 2012-05-18 ENCOUNTER — Other Ambulatory Visit: Payer: Self-pay | Admitting: Physician Assistant

## 2012-06-15 ENCOUNTER — Other Ambulatory Visit: Payer: Self-pay | Admitting: Physician Assistant

## 2012-06-16 ENCOUNTER — Other Ambulatory Visit: Payer: Self-pay | Admitting: *Deleted

## 2012-06-25 ENCOUNTER — Encounter: Payer: Self-pay | Admitting: Family Medicine

## 2012-06-25 ENCOUNTER — Ambulatory Visit (INDEPENDENT_AMBULATORY_CARE_PROVIDER_SITE_OTHER): Payer: 59 | Admitting: Family Medicine

## 2012-06-25 VITALS — BP 127/87 | HR 100 | Temp 98.4°F | Resp 20 | Ht 63.5 in | Wt 190.0 lb

## 2012-06-25 DIAGNOSIS — F411 Generalized anxiety disorder: Secondary | ICD-10-CM

## 2012-06-25 DIAGNOSIS — M25519 Pain in unspecified shoulder: Secondary | ICD-10-CM

## 2012-06-25 DIAGNOSIS — G8929 Other chronic pain: Secondary | ICD-10-CM

## 2012-06-25 DIAGNOSIS — F419 Anxiety disorder, unspecified: Secondary | ICD-10-CM

## 2012-06-25 DIAGNOSIS — J329 Chronic sinusitis, unspecified: Secondary | ICD-10-CM

## 2012-06-25 DIAGNOSIS — I10 Essential (primary) hypertension: Secondary | ICD-10-CM

## 2012-06-25 MED ORDER — DICLOFENAC SODIUM 75 MG PO TBEC: 75.0000 mg | DELAYED_RELEASE_TABLET | Freq: Two times a day (BID) | ORAL | Status: DC

## 2012-06-25 MED ORDER — LEVOFLOXACIN 500 MG PO TABS: 500.0000 mg | ORAL_TABLET | Freq: Every day | ORAL | Status: DC

## 2012-06-25 MED ORDER — FLUOXETINE HCL 40 MG PO CAPS: 40.0000 mg | ORAL_CAPSULE | Freq: Every day | ORAL | Status: DC

## 2012-06-25 MED ORDER — ZOLPIDEM TARTRATE 10 MG PO TABS: 10.0000 mg | ORAL_TABLET | Freq: Every evening | ORAL | Status: DC | PRN

## 2012-06-25 MED ORDER — SUMATRIPTAN SUCCINATE 100 MG PO TABS: 100.0000 mg | ORAL_TABLET | ORAL | Status: DC | PRN

## 2012-06-25 MED ORDER — CYCLOBENZAPRINE HCL 10 MG PO TABS: ORAL_TABLET | ORAL | Status: DC

## 2012-06-25 MED ORDER — ALLOPURINOL 300 MG PO TABS: 300.0000 mg | ORAL_TABLET | Freq: Every day | ORAL | Status: DC

## 2012-06-25 MED ORDER — HYOSCYAMINE SULFATE ER 0.375 MG PO TB12: 0.3750 mg | ORAL_TABLET | Freq: Two times a day (BID) | ORAL | Status: DC | PRN

## 2012-06-25 MED ORDER — ALPRAZOLAM 0.5 MG PO TABS: 0.5000 mg | ORAL_TABLET | Freq: Three times a day (TID) | ORAL | Status: DC | PRN

## 2012-06-25 MED ORDER — ESOMEPRAZOLE MAGNESIUM 40 MG PO CPDR: 40.0000 mg | DELAYED_RELEASE_CAPSULE | Freq: Two times a day (BID) | ORAL | Status: DC

## 2012-06-27 NOTE — Progress Notes (Signed)
S:  Pt is here today for follow-up; she is in counseling and is coping better with family issues. Main concern is recurrent sinus pain and congestion w/ low-grade fever and nonprod. Cough (onset  1-2 weeks ago). She feels immune system is "down" due to stress. Mucinex DM helps a little. Requests Levaquin which always works well. When asked, she cannot recall having a sinus CT or full eval recently by ENT. She insists on antibiotic because she wants to be well for holiday to spent w/ family and small children.   Pt c/o ongoing shoulder pain. This is chronic problem dating back to 2006-07. S/P surgery. Discussed this in July at which time pt not interested in physical therapy.   Pt needs medication refills.  O:  Filed Vitals:   06/25/12 1204  BP: 127/87  Pulse: 100  Temp: 98.4 F (36.9 C)  Resp: 20   GEN: In NAD; WN,WD.  HEENT: [place]/AT; EOMI w/ clear conj/scl. EACs/TMs normal Nasal mucosa red and dry. No rhinorrhea or edema. Post ph erythematous w/o exudate. NECK: No LAN. LUNGS: CTA; no rhonchi or wheezes. SKIN: W&D; no rashes. NEURO: A&O x 3; Nonfocal.  A/P: 1. Recurrent sinusitis    RX: Levaquin; pt advised that this is last RX for antibiotic. Will need to have CT of sinuses as part of evaluation  2. Chronic shoulder pain    Pt will need MRI and/or ORTHO eval  3. HTN (hypertension)        Continue current medications  4. Chronic anxiety               RF Alprazolam; cont counseling.   All medications RFs completed as warranted.

## 2012-06-30 ENCOUNTER — Telehealth: Payer: Self-pay

## 2012-06-30 NOTE — Telephone Encounter (Signed)
Called pt to get h/o Nexium Rx after receiving fax from [facility] asking [place] to consider QD dosing of Nexium instead of BID. Pt reports that her GI in [place] had put her on Nexium after having gastritis and severe GERD and failing OTC medications. She believes he started her on BID dosing bc her condition was severe at the time. She stated that her life is very stressful at the moment and she will come in to see Dr Audria Nine after the [facility] and discuss whether a trial of QD dosing is appropriate. Faxed form back to [facility] w/info that no change for now but pt will discuss w/provider at later date.

## 2012-08-24 ENCOUNTER — Other Ambulatory Visit: Payer: Self-pay | Admitting: Family Medicine

## 2012-08-28 ENCOUNTER — Telehealth: Payer: Self-pay

## 2012-08-28 NOTE — Telephone Encounter (Signed)
errror

## 2012-09-03 ENCOUNTER — Telehealth: Payer: Self-pay

## 2012-09-03 NOTE — Telephone Encounter (Signed)
PT STATES SHE NEED A SCRIPT OF C-ESTRA DINE 4 MGS FOR 2 TIMES A DAY. THIS IS A NEW SCRIPT AND WE HAVE NEVER GIVEN IT TO HER BEFORE PLEASE CALL [phone]   [facility]

## 2012-09-04 NOTE — Telephone Encounter (Signed)
Called this in for her, unable to document. 30ml with 5 refills/ compounded estrogen, if you know how to document will you please help me?

## 2012-09-04 NOTE — Telephone Encounter (Signed)
Yes, please call this to [facility]. OK to refill for 6 months. Thank you.

## 2012-09-04 NOTE — Telephone Encounter (Signed)
compunding medication it is estrogen she uses 2 clicks [employer] [phone]  ... She has Estradiol 4mg / ml she gets 30ml and uses it daily 5 ml per day. Is it okay to send this in?

## 2012-09-10 ENCOUNTER — Other Ambulatory Visit: Payer: Self-pay | Admitting: Family Medicine

## 2012-09-11 NOTE — Telephone Encounter (Signed)
Med[facility] [address].

## 2012-09-23 ENCOUNTER — Other Ambulatory Visit: Payer: Self-pay | Admitting: Family Medicine

## 2012-10-10 ENCOUNTER — Ambulatory Visit (INDEPENDENT_AMBULATORY_CARE_PROVIDER_SITE_OTHER): Payer: PRIVATE HEALTH INSURANCE | Admitting: Family Medicine

## 2012-10-10 ENCOUNTER — Encounter: Payer: Self-pay | Admitting: Family Medicine

## 2012-10-10 VITALS — BP 140/82 | HR 119 | Temp 98.2°F | Resp 16 | Ht 63.0 in | Wt 191.0 lb

## 2012-10-10 DIAGNOSIS — Z9109 Other allergy status, other than to drugs and biological substances: Secondary | ICD-10-CM

## 2012-10-10 DIAGNOSIS — K589 Irritable bowel syndrome without diarrhea: Secondary | ICD-10-CM

## 2012-10-10 DIAGNOSIS — R1032 Left lower quadrant pain: Secondary | ICD-10-CM

## 2012-10-10 DIAGNOSIS — R229 Localized swelling, mass and lump, unspecified: Secondary | ICD-10-CM

## 2012-10-10 MED ORDER — HYOSCYAMINE SULFATE ER 0.375 MG PO TB12: 0.3750 mg | ORAL_TABLET | Freq: Two times a day (BID) | ORAL | Status: DC | PRN

## 2012-10-10 MED ORDER — MONTELUKAST SODIUM 10 MG PO TABS: 10.0000 mg | ORAL_TABLET | Freq: Every day | ORAL | Status: DC

## 2012-10-10 NOTE — Patient Instructions (Addendum)
Environmental allergies- try OTC Similisan eye drops; new medication- Montelukast (Singulair) 10 mg   1 tablet every evening.  A staff member will call you with appointment for CT of abdomen and pelvis to look for abdominal wall hernia. I will be in touch with you after results of this test have been reviewed. See information below re: Hernias.   Hernia A hernia occurs when an internal organ pushes out through a weak spot in the abdominal wall. Hernias most commonly occur in the groin and around the navel. Hernias often can be pushed back into place (reduced). Most hernias tend to get worse over time. Some abdominal hernias can get stuck in the opening (irreducible or incarcerated hernia) and cannot be reduced. An irreducible abdominal hernia which is tightly squeezed into the opening is at risk for impaired blood supply (strangulated hernia). A strangulated hernia is a medical emergency. Because of the risk for an irreducible or strangulated hernia, surgery may be recommended to repair a hernia. CAUSES   Heavy lifting.  Prolonged coughing.  Straining to have a bowel movement.  A cut (incision) made during an abdominal surgery. HOME CARE INSTRUCTIONS   Bed rest is not required. You may continue your normal activities.  Avoid lifting more than 10 pounds (4.5 kg) or straining.  Cough gently. If you are a smoker it is best to stop. Even the best hernia repair can break down with the continual strain of coughing. Even if you do not have your hernia repaired, a cough will continue to aggravate the problem.  Do not wear anything tight over your hernia. Do not try to keep it in with an outside bandage or truss. These can damage abdominal contents if they are trapped within the hernia sac.  Eat a normal diet.  Avoid constipation. Straining over long periods of time will increase hernia size and encourage breakdown of repairs. If you cannot do this with diet alone, stool softeners may be  used. SEEK IMMEDIATE MEDICAL CARE IF:   You have a fever.  You develop increasing abdominal pain.  You feel nauseous or vomit.  Your hernia is stuck outside the abdomen, looks discolored, feels hard, or is tender.  You have any changes in your bowel habits or in the hernia that are unusual for you.  You have increased pain or swelling around the hernia.  You cannot push the hernia back in place by applying gentle pressure while lying down. MAKE SURE YOU:   Understand these instructions.  Will watch your condition.  Will get help right away if you are not doing well or get worse. Document Released: 07/30/2005 Document Revised: 10/22/2011 Document Reviewed: 03/18/2008 [facility] Patient Information 2013 [place], [place].

## 2012-10-12 NOTE — Progress Notes (Signed)
S: Pt is a 62 y.o. Cauc female who has chronic anxiety/ depression, chronic pain and disability due to DDD and chronic GI symptoms (IBS and GERD). Today she c/o recurrent sinus symptoms but does not think she has a sinus infection. NETI pot use has been very beneficial. She c/o sinus congestion and pressure in cheeks; nasal d/c has been minimal and not discolored. Cough due to PND has been reduced. She has not had fever/ chills, n/v or severe sinus HA. Flare up of allergies is associated primarily with exposure to pine needles; she and her husband are moving into a home surrounded by pine trees.  Pt c/o a "knot" in the suprapubic area; it is not tender or red. She also has LLQ discomfort; she states BMs have been normal. She uses hyoscyamine daily to manage IBS symptoms. Stools are negative for blood or melena.  ROS: As per HPI. Negative for fever/chills, epistaxis, sore throat, difficulty swallowing, n/v/d, rashes or pallor. She has no thoughts of self-harm or harm of others.  O:  Filed Vitals:   10/10/12 1312  BP: 140/82  Pulse: 119  Temp: 98.2 F (36.8 C)  Resp: 16   GEN: In NAD; WN obese female. HEENT: /AT; EOMI w/ slightly injected conj and normal sclerae. Sinuses mildly tender w/ percussion. TMs w/o effusion, redness or retraction, Post ph w/ mild erythema and cobblestoning. NECK: No LAN. COR: RRR. No m/g/r. LUNGS: CTA. Normal resp rate and effort. ABD: Slightly distended; tender in LLQ; No masses or HSM. No guarding or rebound. SKIN: Suprapubic area- 1.5 cm mobile NT subcutaneous nodule; no redness or drainage. MS: MAEs; generalized muscle tenderness near joints. Pt able to sit cross-legged on examine table w/o discomfort. NEURO: A&O x 3; CNs intact. Nonfocal.  A/P: Abdominal pain, left lower quadrant - R/O Hernia   Plan: CT Abdomen Pelvis W Contrast  Environmental allergies- continue symptomatic measures. Trial Montelukast (Singulair) 10 mg  1 tab hs.  IBS (irritable bowel  syndrome)- RF: Hyoscyamine (no dose change).  Subcutaneous nodule-  Suspect epidermoid or sebaceous cyst; monitor.   Meds ordered this encounter  Medications  . montelukast (SINGULAIR) 10 MG tablet    Sig: Take 1 tablet (10 mg total) by mouth at bedtime.    Dispense:  30 tablet    Refill:  5  . hyoscyamine (LEVBID) 0.375 MG 12 hr tablet    Sig: Take 1 tablet (0.375 mg total) by mouth every 12 (twelve) hours as needed for cramping.    Dispense:  60 tablet    Refill:  3

## 2012-10-15 ENCOUNTER — Encounter (HOSPITAL_COMMUNITY): Payer: Self-pay

## 2012-10-15 ENCOUNTER — Ambulatory Visit (HOSPITAL_COMMUNITY)
Admission: RE | Admit: 2012-10-15 | Discharge: 2012-10-15 | Disposition: A | Discharge status: Home / Self Care | Payer: PRIVATE HEALTH INSURANCE | Source: Ambulatory Visit | Attending: Family Medicine | Admitting: Family Medicine

## 2012-10-15 DIAGNOSIS — R1032 Left lower quadrant pain: Secondary | ICD-10-CM | POA: Insufficient documentation

## 2012-10-15 DIAGNOSIS — D1803 Hemangioma of intra-abdominal structures: Secondary | ICD-10-CM | POA: Insufficient documentation

## 2012-10-15 MED ORDER — IOHEXOL 300 MG/ML  SOLN
100.0000 mL | Freq: Once | INTRAMUSCULAR | Status: AC | PRN
  Administered 2012-10-15: 100 mL via INTRAVENOUS

## 2012-10-15 NOTE — Progress Notes (Signed)
Quick Note:  Please contact pt to advise her that:   Your recent imaging study (CT of abdomen) shows some fatty changes in the liver and the liver lesion that you are aware of. This liver abnormality looks like a hemangioma which is a benign tumor of dilated blood vessels. No cause for the pain in your left lower abdomen was seen. Everything else looked normal. If your discomfort continues, schedule a follow-up visit for evaluation. You should avoid heavy lifting or straining.  Copy to pt. ______

## 2012-10-25 ENCOUNTER — Other Ambulatory Visit: Payer: Self-pay | Admitting: Physician Assistant

## 2012-10-25 ENCOUNTER — Other Ambulatory Visit: Payer: Self-pay | Admitting: Family Medicine

## 2012-11-04 ENCOUNTER — Other Ambulatory Visit: Payer: Self-pay | Admitting: Family Medicine

## 2012-11-04 NOTE — Telephone Encounter (Signed)
Patients pharmacy has faxed request for Alprazolam please advise. Also asking for Ambien.

## 2012-11-05 NOTE — Telephone Encounter (Signed)
I called refills to pt's pharmacy. FAX can be destroyed.

## 2012-11-22 ENCOUNTER — Other Ambulatory Visit: Payer: Self-pay | Admitting: Family Medicine

## 2012-12-04 ENCOUNTER — Other Ambulatory Visit: Payer: Self-pay | Admitting: Family Medicine

## 2012-12-04 DIAGNOSIS — N898 Other specified noninflammatory disorders of vagina: Secondary | ICD-10-CM

## 2012-12-04 NOTE — Telephone Encounter (Signed)
Patient would like to talk to a nurse regarding one of her prescriptions [phone]

## 2012-12-04 NOTE — Telephone Encounter (Signed)
Refilled Estrace for patient until appt in May with Dr. Audria Nine. She would like to discuss alternatives to the cream for vaginal dryness at this appt.

## 2012-12-05 NOTE — Telephone Encounter (Signed)
[facility]. The [facility] cream is at the [facility] if the pt decides she wants to pay for it (pharmacist says it is expensive).

## 2012-12-24 ENCOUNTER — Other Ambulatory Visit: Payer: Self-pay | Admitting: Physician Assistant

## 2012-12-24 NOTE — Telephone Encounter (Signed)
Need ov , labs

## 2012-12-26 ENCOUNTER — Encounter: Payer: Self-pay | Admitting: Family Medicine

## 2012-12-26 ENCOUNTER — Ambulatory Visit (INDEPENDENT_AMBULATORY_CARE_PROVIDER_SITE_OTHER): Payer: PRIVATE HEALTH INSURANCE | Admitting: Family Medicine

## 2012-12-26 VITALS — BP 120/84 | HR 104 | Temp 99.0°F | Resp 16 | Ht 63.0 in | Wt 185.2 lb

## 2012-12-26 DIAGNOSIS — F39 Unspecified mood [affective] disorder: Secondary | ICD-10-CM

## 2012-12-26 DIAGNOSIS — Z760 Encounter for issue of repeat prescription: Secondary | ICD-10-CM

## 2012-12-26 DIAGNOSIS — G5600 Carpal tunnel syndrome, unspecified upper limb: Secondary | ICD-10-CM

## 2012-12-26 DIAGNOSIS — K769 Liver disease, unspecified: Secondary | ICD-10-CM

## 2012-12-26 DIAGNOSIS — R1032 Left lower quadrant pain: Secondary | ICD-10-CM

## 2012-12-26 DIAGNOSIS — K7689 Other specified diseases of liver: Secondary | ICD-10-CM

## 2012-12-26 DIAGNOSIS — G5602 Carpal tunnel syndrome, left upper limb: Secondary | ICD-10-CM

## 2012-12-26 LAB — POCT URINALYSIS DIPSTICK
Blood, UA: NEGATIVE
Glucose, UA: NEGATIVE
Nitrite, UA: NEGATIVE
Protein, UA: NEGATIVE
Spec Grav, UA: 1.01
Urobilinogen, UA: 0.2

## 2012-12-26 MED ORDER — ALPRAZOLAM 0.5 MG PO TABS: ORAL_TABLET | ORAL | Status: DC

## 2012-12-26 MED ORDER — ZOLPIDEM TARTRATE 10 MG PO TABS: ORAL_TABLET | ORAL | Status: DC

## 2012-12-26 MED ORDER — CYCLOBENZAPRINE HCL 10 MG PO TABS: 10.0000 mg | ORAL_TABLET | Freq: Two times a day (BID) | ORAL | Status: DC | PRN

## 2012-12-26 NOTE — Progress Notes (Signed)
S:  This 62 y.o Cauc female has abd cramping and loose stools last night. Pt reports eating ice cream, peaches and cottage cheese and some other foods that may be bowel irritants. She has chronic GI problems but has episodic abnormal bowel movements associated with IBS. She also has a low-grade fever today; pt thinks it may be related to chronic sinus problem stating "I never get a fever when I am having problems with my GI tract". MRI/MRA of abdomen/pelvis performed in [place] revealed a hemangioma which was supposed to be followed w/ repeat MRI. Until recently, pt has not been able to have this follow-up imaging; she is requesting that today.  She continues to have family stressors but has a better attitude about things. She and her husband have moved into a townhouse (> 1200 sq.ft). She has not thoughts or self harm, SI/HI. Her husband has a steady job w/ steady income so she is feeling less financial burden. Her MCR disability was approved.  CTS- pt has chronic carpel tunnel syndrome but has never had surgery. She c/o L middle finger catching and "stuck in one position" especially while she is sleeping. She has not been dropping items or had weakness.  Medication refill- pt has been using a compounded HRT cream and needs this refilled. She queried whether she should consider oral HRT; her main complaint is vaginal dryness.  PMHx, Soc Hx and Fam Hx reviewed.  ROS: AS per HPI. Otherwise noncontributory.  O: Filed Vitals:   12/26/12 1420  BP: 120/84  Pulse: 104  Temp: 99 F (37.2 C)  Resp: 16   GEN: In NAD; WN/WD.  HENT: [place]/AT; EOMI w/ conj injection, nonicteric sclerae. EACs normal. TMs minimally injected w/o effusion or bulging. Post ph erythematous w/o exudate. Sinuses mildly tender w/ percussion. NECK: No LAN. COR: RRR. No m/g/r. LUNGS: CTA; no wheezes, rhonchi or rales. ABD: Obese, mildly distended w/ increased BS; tender in LLQ w/o rebound. No HSM or masses. SKIN: Warm to touch. No  rashes or pallor. NEURO: A&O x 3; CNs intact. Psych- pt is occasionally inappropriate with statements meant to be humorous; she fidgets but is not agitated or angry or labile. Speech pattern, thought content and judgement are normal.    Results for orders placed in visit on 12/26/12  POCT URINALYSIS DIPSTICK      Result Value Range   Color, UA yellow     Clarity, UA clear     Glucose, UA neg     Bilirubin, UA neg     Ketones, UA neg     Spec Grav, UA 1.010     Blood, UA neg     pH, UA 7.0     Protein, UA neg     Urobilinogen, UA 0.2     Nitrite, UA neg     Leukocytes, UA Negative      A/P: Hepatic lesion - follow-up of hemangioma. Plan: MR Abdomen W Contrast  CTS (carpal tunnel syndrome), left - Plan: Ambulatory referral to Hand Surgery  LLQ pain - Plan: POCT urinalysis dipstick, CANCELED: POCT UA - Microscopic Only  Episodic mood disorder- stable; continue current medication.  Issue of repeat prescriptions Meds ordered this encounter  Medications  . OVER THE COUNTER MEDICATION    Sig: OTC Vitamin D 3 2000 mg daily  . cyclobenzaprine (FLEXERIL) 10 MG tablet    Sig: Take 1 tablet (10 mg total) by mouth 2 (two) times daily as needed for muscle spasms.    Dispense:  60 tablet    Refill:  1  . ALPRAZolam (XANAX) 0.5 MG tablet    Sig: TAKE ONE TABLET THREE TIMES DAILY AS NEEDED FOR ANXIETY.    Dispense:  90 tablet    Refill:  1  . zolpidem (AMBIEN) 10 MG tablet    Sig: TAKE 1 TABLET BY MOUTH AT BEDTIME AS NEEDED FOR SLEEP.    Dispense:  30 tablet    Refill:  1  . PRESCRIPTION MEDICATION    Sig: Apply 4 mg/mL topically daily. C-Estradiol HRT compound cream; this is compounded at [facility] ph# [phone].     Total time face-to-face with pt: 60 minutes.

## 2012-12-26 NOTE — Patient Instructions (Addendum)
Diverticulosis Diverticulosis is a common condition that develops when small pouches (diverticula) form in the wall of the colon. The risk of diverticulosis increases with age. It happens more often in people who eat a low-fiber diet. Most individuals with diverticulosis have no symptoms. Those individuals with symptoms usually experience abdominal pain, constipation, or loose stools (diarrhea). HOME CARE INSTRUCTIONS   Increase the amount of fiber in your diet as directed by your caregiver or dietician. This may reduce symptoms of diverticulosis.  Your caregiver may recommend taking a dietary fiber supplement.  Drink at least 6 to 8 glasses of water each day to prevent constipation.  Try not to strain when you have a bowel movement.  Your caregiver may recommend avoiding nuts and seeds to prevent complications, although this is still an uncertain benefit.  Only take over-the-counter or prescription medicines for pain, discomfort, or fever as directed by your caregiver. FOODS WITH HIGH FIBER CONTENT INCLUDE:  Fruits. Apple, peach, pear, tangerine, raisins, prunes.  Vegetables. Brussels sprouts, asparagus, broccoli, cabbage, carrot, cauliflower, romaine lettuce, spinach, summer squash, tomato, winter squash, zucchini.  Starchy Vegetables. Baked beans, kidney beans, lima beans, split peas, lentils, potatoes (with skin).  Grains. Whole wheat bread, brown rice, bran flake cereal, plain oatmeal, white rice, shredded wheat, bran muffins. SEEK IMMEDIATE MEDICAL CARE IF:   You develop increasing pain or severe bloating.  You have an oral temperature above 102 F (38.9 C), not controlled by medicine.  You develop vomiting or bowel movements that are bloody or black. Document Released: 04/26/2004 Document Revised: 10/22/2011 Document Reviewed: 12/28/2009 [facility] Patient Information 2013 [place], [place].   Diverticulitis Small pockets or "bubbles" can develop in the wall of the intestine.  Diverticulitis is when those pockets become infected and inflamed. This causes stomach pain (usually on the left side). HOME CARE  Take all medicine as told by your doctor.  Try a clear liquid diet (broth, tea, or water) for as long as told by your doctor.  Keep all follow-up visits with your doctor.  You may be put on a low-fiber diet once you start feeling better. Here are foods that have low-fiber:  White breads, cereals, rice, and pasta.  Cooked fruits and vegetables or soft fresh fruits and vegetables without the skin.  Ground or well-cooked tender beef, ham, veal, lamb, pork, or poultry.  Eggs and seafood.  After you are doing well on the low-fiber diet, you may be put on a high-fiber diet. Here are ways to increase your fiber:  Choose whole-grain breads, cereals, pasta, and brown rice.  Choose fruits and vegetables with skin on. Do not overcook the vegetables.  Choose nuts, seeds, legumes, dried peas, beans, and lentils.  Look for food products that have more than 3 grams of fiber per serving on the food label. GET HELP RIGHT AWAY IF:  Your pain does not get better or gets worse.  You have trouble eating food.  You are not pooping (having bowel movements) like normal.  You have a temperature by mouth above 102 F (38.9 C), not controlled by medicine.  You keep throwing up (vomiting).  You have bloody or black, tarry poop (stools).  You are getting worse and not better. MAKE SURE YOU:   Understand these instructions.  Will watch your condition.  Will get help right away if you are not doing well or get worse. Document Released: 01/16/2008 Document Revised: 10/22/2011 Document Reviewed: 06/20/2009 [facility] Patient Information 2013 [place], [place].   I called [employer]  an authorized 6 refills on Estradiol cream. The office will contact you with MRI results once I have reviewed the findings.

## 2012-12-30 ENCOUNTER — Other Ambulatory Visit: Payer: Self-pay | Admitting: Radiology

## 2012-12-30 DIAGNOSIS — R935 Abnormal findings on diagnostic imaging of other abdominal regions, including retroperitoneum: Secondary | ICD-10-CM

## 2013-01-21 ENCOUNTER — Ambulatory Visit
Admission: RE | Admit: 2013-01-21 | Discharge: 2013-01-21 | Disposition: A | Discharge status: Home / Self Care | Payer: PRIVATE HEALTH INSURANCE | Source: Ambulatory Visit | Attending: Family Medicine | Admitting: Family Medicine

## 2013-01-21 DIAGNOSIS — R935 Abnormal findings on diagnostic imaging of other abdominal regions, including retroperitoneum: Secondary | ICD-10-CM

## 2013-01-21 MED ORDER — GADOBENATE DIMEGLUMINE 529 MG/ML IV SOLN
17.0000 mL | Freq: Once | INTRAVENOUS | Status: AC | PRN
  Administered 2013-01-21: 17 mL via INTRAVENOUS

## 2013-01-21 NOTE — Progress Notes (Signed)
Quick Note:  Your recent imaging study is normal. She has MRI of abdomen as follow-up evaluation of liver lesion (hemangioma= small area of dilated blood vessels in the liver). This is a benign lesion and no further imaging or evaluation is needed at this time. ______

## 2013-01-26 ENCOUNTER — Encounter: Payer: Self-pay | Admitting: Radiology

## 2013-01-27 ENCOUNTER — Other Ambulatory Visit: Payer: Self-pay | Admitting: Family Medicine

## 2013-02-25 ENCOUNTER — Other Ambulatory Visit: Payer: Self-pay | Admitting: Physician Assistant

## 2013-02-26 NOTE — Telephone Encounter (Signed)
Dr Audria Nine, I'm not sure whether you have ever Rxd this for pt. Do you want to give RFs?

## 2013-02-26 NOTE — Telephone Encounter (Signed)
I authorized refills for Allopurinol.

## 2013-03-10 ENCOUNTER — Other Ambulatory Visit: Payer: Self-pay | Admitting: Family Medicine

## 2013-03-10 NOTE — Telephone Encounter (Signed)
Forward to Dr. McPherson. 

## 2013-03-11 ENCOUNTER — Telehealth: Payer: Self-pay

## 2013-03-11 NOTE — Telephone Encounter (Signed)
Can you check on this please?

## 2013-03-11 NOTE — Telephone Encounter (Signed)
Alprazolam and Zolpidem refills called to pt's pharmacy.

## 2013-03-11 NOTE — Telephone Encounter (Signed)
Pt calling about a referral to a hand doctor. York Spaniel this was discussed at her May visit with Dr. Audria Nine but she has not heard anything regarding this appt.  [phone]

## 2013-03-25 ENCOUNTER — Other Ambulatory Visit: Payer: Self-pay | Admitting: Family Medicine

## 2013-03-25 ENCOUNTER — Other Ambulatory Visit: Payer: Self-pay | Admitting: Physician Assistant

## 2013-04-17 ENCOUNTER — Encounter: Payer: Self-pay | Admitting: Family Medicine

## 2013-04-17 ENCOUNTER — Ambulatory Visit (INDEPENDENT_AMBULATORY_CARE_PROVIDER_SITE_OTHER): Payer: PRIVATE HEALTH INSURANCE | Admitting: Family Medicine

## 2013-04-17 VITALS — BP 150/86 | HR 94 | Temp 98.6°F | Resp 16 | Ht 63.0 in | Wt 186.0 lb

## 2013-04-17 DIAGNOSIS — I10 Essential (primary) hypertension: Secondary | ICD-10-CM

## 2013-04-17 DIAGNOSIS — J329 Chronic sinusitis, unspecified: Secondary | ICD-10-CM

## 2013-04-17 DIAGNOSIS — F17200 Nicotine dependence, unspecified, uncomplicated: Secondary | ICD-10-CM

## 2013-04-17 DIAGNOSIS — R6889 Other general symptoms and signs: Secondary | ICD-10-CM

## 2013-04-17 DIAGNOSIS — Z720 Tobacco use: Secondary | ICD-10-CM

## 2013-04-17 DIAGNOSIS — K219 Gastro-esophageal reflux disease without esophagitis: Secondary | ICD-10-CM

## 2013-04-17 HISTORY — DX: Tobacco use: Z72.0

## 2013-04-17 LAB — CBC WITH DIFFERENTIAL/PLATELET
Basophils Absolute: 0 10*3/uL (ref 0.0–0.1)
Basophils Relative: 0 % (ref 0–1)
Eosinophils Absolute: 0.2 10*3/uL (ref 0.0–0.7)
Hemoglobin: 11.7 g/dL — ABNORMAL LOW (ref 12.0–15.0)
MCH: 30.2 pg (ref 26.0–34.0)
MCHC: 34.3 g/dL (ref 30.0–36.0)
Neutro Abs: 4.2 10*3/uL (ref 1.7–7.7)
Neutrophils Relative %: 57 % (ref 43–77)
Platelets: 336 10*3/uL (ref 150–400)
RDW: 15.3 % (ref 11.5–15.5)

## 2013-04-17 LAB — BASIC METABOLIC PANEL
BUN: 14 mg/dL (ref 6–23)
Calcium: 9.1 mg/dL (ref 8.4–10.5)
Glucose, Bld: 86 mg/dL (ref 70–99)
Potassium: 3.9 mEq/L (ref 3.5–5.3)

## 2013-04-17 LAB — T4, FREE: Free T4: 1.16 ng/dL (ref 0.80–1.80)

## 2013-04-17 LAB — C-REACTIVE PROTEIN: CRP: <0.5 mg/dL (ref <0.60)

## 2013-04-17 LAB — T3, FREE: T3, Free: 2.5 pg/mL (ref 2.3–4.2)

## 2013-04-17 MED ORDER — VALSARTAN-HYDROCHLOROTHIAZIDE 160-25 MG PO TABS: ORAL_TABLET | ORAL | Status: DC

## 2013-04-17 NOTE — Patient Instructions (Addendum)
Continue using your nasal spray to rinse your sinuses and consider AIR PURIFIERS for the home. At this time, I am not prescribing any antibiotics. I do want to encourage you and your husband to quit smoking!!!

## 2013-04-18 LAB — SEDIMENTATION RATE: Sed Rate: 23 mm/hr — ABNORMAL HIGH (ref 0–22)

## 2013-04-18 LAB — C4 COMPLEMENT: C4 Complement: 34 mg/dL (ref 10–40)

## 2013-04-20 ENCOUNTER — Encounter: Payer: Self-pay | Admitting: Family Medicine

## 2013-04-21 NOTE — Progress Notes (Signed)
Subjective:    Patient ID: Melinda Peterson, female    DOB: 01-Dec-1950, 62 y.o.   MRN: [id number]  HPI  This 62 y.o. Cauc female returns for further eval of multiple symptoms including sinus HA and  sinus congestions x 1 month. She has a nonprod cough and hx of acid reflux/GERD. She feels    "hot inside" followed by "chills" but denies fever. "I'm the Tylenol Queen" pt states as an indication  of multiple daily doses of this medication for her symptoms.    Fatigue- pt c/o overwhelming tiredness even when sitting. She denies a hx of sleep disorder but  has never been tested.   Diverticular disease- had a "mini'flare up" w. LLQ discomfort last night. This pain was not  accompanied by explosive diarrhea.   L hand middle finger pain- this has become chronic but pt has self-referred to [facility] (though  I have advised that Hand specialist would be more appropriate referral). She is taping finger for   now w/ some relief.     Patient Active Problem List   Diagnosis Date Noted  . Tobacco user 04/17/2013  . Chronic recurrent sinusitis 01/06/2012  . GERD (gastroesophageal reflux disease) 12/26/2011  . IBS (irritable bowel syndrome) 12/26/2011  . Diverticulosis of colon 12/26/2011  . Degenerative disc disease, cervical 12/26/2011  . Obesity (BMI 30.0-34.9) 12/26/2011  . HTN (hypertension) 11/29/2011  . Gout 11/29/2011  . Mood disorder 11/29/2011    PMHx, Soc Hx and Medications reconciled.   Review of Systems  Constitutional: Positive for chills and fatigue. Negative for fever and activity change.  HENT: Positive for congestion, postnasal drip and sinus pressure. Negative for trouble swallowing and voice change.   Eyes: Negative.   Respiratory: Positive for cough.   Cardiovascular: Negative.   Gastrointestinal: Positive for abdominal pain.  Musculoskeletal: Positive for joint swelling.  Allergic/Immunologic: Positive for environmental allergies.  Neurological: Negative.    Psychiatric/Behavioral: Positive for sleep disturbance.       Objective:   Physical Exam  Nursing note and vitals reviewed. Constitutional: She is oriented to person, place, and time. She appears well-developed and well-nourished. No distress.  HENT:  Head: Normocephalic and atraumatic.  Right Ear: Hearing, external ear and ear canal normal. Tympanic membrane is scarred. Tympanic membrane is not erythematous and not bulging. No middle ear effusion.  Left Ear: Hearing, external ear and ear canal normal. Tympanic membrane is scarred. Tympanic membrane is not erythematous and not bulging.  No middle ear effusion.  Nose: No nasal deformity or septal deviation. Right sinus exhibits maxillary sinus tenderness. Right sinus exhibits no frontal sinus tenderness. Left sinus exhibits maxillary sinus tenderness. Left sinus exhibits no frontal sinus tenderness.  Mouth/Throat: Uvula is midline and mucous membranes are normal. No oral lesions. Normal dentition. Posterior oropharyngeal erythema present. No oropharyngeal exudate.  Eyes: Conjunctivae, EOM and lids are normal. Pupils are equal, round, and reactive to light.  Neck: Neck supple. No spinous process tenderness and no muscular tenderness present. No mass and no thyromegaly present.  Cardiovascular: Normal rate, regular rhythm and normal heart sounds.   Pulmonary/Chest: Effort normal and breath sounds normal. No respiratory distress.  Abdominal: Soft. Normal appearance and bowel sounds are normal. She exhibits no distension. There is no hepatosplenomegaly. There is tenderness in the left lower quadrant. There is no rebound, no guarding and no CVA tenderness.  Musculoskeletal: Normal range of motion. She exhibits no edema and no tenderness.  Lymphadenopathy:    She has no cervical adenopathy.  Neurological: She is alert and oriented to person, place, and time. No cranial nerve deficit. She exhibits normal muscle tone. Coordination normal.  Skin: Skin  is warm and dry. No erythema.  Psychiatric: She has a normal mood and affect. Her behavior is normal. Judgment and thought content normal.        Assessment & Plan:  Impaired regulation of body temperature - Plan: T4, Free, T3, Free, CBC with Differential, Sedimentation Rate, ANA, C3 complement, C4 complement, C-reactive protein  Unspecified sinusitis (chronic) - Advised symptomatic treatment and NO antibiotic.   Plan: CT Maxillofacial WO CM  GERD (gastroesophageal reflux disease) - Plan: Ambulatory referral to Gastroenterology  HTN (hypertension) - Stable and controlled.    Plan: Basic metabolic panel, T4, Free, T3, Free, ANA  Tobacco user- Encouraged cessation which she and her husband are pursuing.  Meds ordered this encounter  Medications  . valsartan-hydrochlorothiazide (DIOVAN-HCT) 160-25 MG per tablet    Sig: Take 1 tablet by mouth daily to lower blood pressure.    Dispense:  30 tablet    Refill:  5

## 2013-04-24 ENCOUNTER — Other Ambulatory Visit: Payer: Self-pay | Admitting: Physician Assistant

## 2013-04-28 ENCOUNTER — Telehealth: Payer: Self-pay

## 2013-04-28 NOTE — Telephone Encounter (Signed)
This was done at [facility]. Have you gotten the report?

## 2013-04-28 NOTE — Telephone Encounter (Signed)
CT of sinuses shows a small right maxillary sinus nodule (common mucous retention cyst). Otherwise, negative study that does not show any changes of acute sinusitis.

## 2013-04-28 NOTE — Telephone Encounter (Signed)
Pt states she had her ct done and is asking for results. Also requests her lab results mailed to her.   She has mychart, but her husband accessed it and changed the password and she cant get on it.   Asks to be called and told her results and mailed her labs.   bf

## 2013-04-29 ENCOUNTER — Telehealth: Payer: Self-pay | Admitting: *Deleted

## 2013-04-29 NOTE — Telephone Encounter (Signed)
Sent copy of lab work from September 5th to patient from staff message request from Dr. Audria Nine.

## 2013-04-29 NOTE — Telephone Encounter (Signed)
Left message for patient to call me back. She can call mychart [phone] for new passwords to be set up, unless she can get the password from her husband.

## 2013-04-29 NOTE — Telephone Encounter (Signed)
Patient is given the number for mychart support.

## 2013-04-29 NOTE — Telephone Encounter (Signed)
Patient indicates she is still running a fever and coughing for one month. She wants round of antibiotics for this.

## 2013-04-29 NOTE — Telephone Encounter (Signed)
Called her to [facility] was transferred to make sooner appt.

## 2013-04-29 NOTE — Telephone Encounter (Signed)
Pt will have to be seen for evaluation; she will need a chest xray as part of evaluation. She has an appt to see me 05/08/13 but she can be rescheduled to this week if she does not want to wait or she can go to [facility]. I will not prescribe antibiotics for her w/o office visit.

## 2013-04-30 ENCOUNTER — Ambulatory Visit (INDEPENDENT_AMBULATORY_CARE_PROVIDER_SITE_OTHER): Payer: PRIVATE HEALTH INSURANCE | Admitting: Family Medicine

## 2013-04-30 ENCOUNTER — Encounter: Payer: Self-pay | Admitting: Family Medicine

## 2013-04-30 ENCOUNTER — Ambulatory Visit: Payer: PRIVATE HEALTH INSURANCE

## 2013-04-30 VITALS — BP 130/90 | HR 90 | Temp 98.2°F | Resp 16 | Ht 63.0 in | Wt 184.2 lb

## 2013-04-30 DIAGNOSIS — Z720 Tobacco use: Secondary | ICD-10-CM

## 2013-04-30 DIAGNOSIS — F17200 Nicotine dependence, unspecified, uncomplicated: Secondary | ICD-10-CM

## 2013-04-30 DIAGNOSIS — R05 Cough: Secondary | ICD-10-CM

## 2013-04-30 DIAGNOSIS — R059 Cough, unspecified: Secondary | ICD-10-CM

## 2013-04-30 MED ORDER — IPRATROPIUM BROMIDE HFA 17 MCG/ACT IN AERS: 2.0000 | INHALATION_SPRAY | Freq: Four times a day (QID) | RESPIRATORY_TRACT | Status: DC

## 2013-04-30 MED ORDER — METHYLPREDNISOLONE ACETATE 80 MG/ML IJ SUSP
80.0000 mg | Freq: Once | INTRAMUSCULAR | Status: AC
  Administered 2013-04-30: 80 mg via INTRAMUSCULAR

## 2013-04-30 MED ORDER — METHYLPREDNISOLONE ACETATE 80 MG/ML IJ SUSP
120.0000 mg | Freq: Once | INTRAMUSCULAR | Status: AC
  Administered 2013-04-30: 120 mg via INTRAMUSCULAR

## 2013-04-30 NOTE — Patient Instructions (Addendum)
Cough, Adult  A cough is a reflex that helps clear your throat and airways. It can help heal the body or may be a reaction to an irritated airway. A cough may only last 2 or 3 weeks (acute) or may last more than 8 weeks (chronic).  CAUSES Acute cough:  Viral or bacterial infections. Chronic cough:  Infections.  Allergies.  Asthma.  Post-nasal drip.  Smoking.  Heartburn or acid reflux.  Some medicines.  Chronic lung problems (COPD).  Cancer. SYMPTOMS   Cough.  Fever.  Chest pain.  Increased breathing rate.  High-pitched whistling sound when breathing (wheezing).  Colored mucus that you cough up (sputum). TREATMENT   A bacterial cough may be treated with antibiotic medicine.  A viral cough must run its course and will not respond to antibiotics.  Your caregiver may recommend other treatments if you have a chronic cough. HOME CARE INSTRUCTIONS   Only take over-the-counter or prescription medicines for pain, discomfort, or fever as directed by your caregiver. Use cough suppressants only as directed by your caregiver.  Use a cold steam vaporizer or humidifier in your bedroom or home to help loosen secretions.  Sleep in a semi-upright position if your cough is worse at night.  Rest as needed.  Stop smoking if you smoke. SEEK IMMEDIATE MEDICAL CARE IF:   You have pus in your sputum.  Your cough starts to worsen.  You cannot control your cough with suppressants and are losing sleep.  You begin coughing up blood.  You have difficulty breathing.  You develop pain which is getting worse or is uncontrolled with medicine.  You have a fever. MAKE SURE YOU:   Understand these instructions.  Will watch your condition.  Will get help right away if you are not doing well or get worse. Document Released: 01/26/2011 Document Revised: 10/22/2011 Document Reviewed: 01/26/2011 [facility] Patient Information 2014 [place], [place].    I have prescribed Atrovent  inhaler (generic)- Inhale 1 -2 puffs every 6 hours as needed for cough.

## 2013-04-30 NOTE — Progress Notes (Signed)
S:  This 62 y.o. Cauc female was seen last week w/ symptoms which she attributed to sinus infection; recent CT of sinuses did not confirm infection. She c/o chronic cough; she has been smoking since young adulthood but recently switched to vapor cigarettes. She c/o HA and roof of mouth hurting. Feeling "hot and cold" inside persists; no hard chills reported. Cough is nonproductive but is having some PND. Pt states she has never had "a smoker's cough". She has been through an entire bottle of Tylenol (250 pills) in one month for subjective fevers. Pt has not been taking Montelukast (Singulair) as she thought that would be too drying; Mucinex has been helpful for getting congestion to break up. Pt reports her husband siad she looked pale.  Pt states she is going to [place] in 2 weeks and wants to be well.  PMHx, Soc Hx and Fam Hx reviewed. Medications reconciled.  ROS: As per HPI; negative for diaphoresis, rhinorrhea, epistaxis, sore throat, wheezing, SOB, DOE, CP or tightness, n/v/d, rash or erythema, dizziness, weakness or syncope.   O: Filed Vitals:   04/30/13 0939  BP: 130/90  Pulse: 90  Temp: 98.2 F (36.8 C)  Resp: 16   GEN: In NAD: WN,WD. HEENT: [place]/AT; EOMI w/ clear conj/sclerae. EACs/TMs normal. Post ph clear, moist and w/o exudate or lesions. NECK: Supple w/o LAN or TMG. COR: RRR. LUNGS: CTA; no wheezes or rales. Normal resp effort w/o distress. SKIN: W&D; intact w/o erythema, ecchymoses or pallor. BACK: CVAT. NEURO: A&O x 3; CNs intact. Nonfocal.  Post nebulizer tx: Lungs CTA.   [facility] reading (PRIMARY) by  Dr. Audria Nine: CXR- Increased perihilar markings but no focal infiltrates or lesions. Cardiac silhouette normal.                                                                              Thoracic spine- kyphosis and early degenerative changes.   A/P: Cough - Plan: DG Chest 2 View, PR INHAL RX, AIRWAY OBST/DX SPUTUM INDUCT, methylPREDNISolone acetate (DEPO-MEDROL)  injection 80 mg, methylPREDNISolone acetate (DEPO-MEDROL) injection 120 mg  Tobacco user - Plan: DG Chest 2 View; discontinue vapor cigarette use.  Meds ordered this encounter  Medications         . methylPREDNISolone acetate (DEPO-MEDROL) injection 120 mg    Sig:   . ipratropium (ATROVENT HFA) 17 MCG/ACT inhaler    Sig: Inhale 2 puffs into the lungs every 6 (six) hours.    Dispense:  1 Inhaler    Refill:  5

## 2013-05-07 ENCOUNTER — Encounter: Payer: Self-pay | Admitting: Gastroenterology

## 2013-05-08 ENCOUNTER — Ambulatory Visit: Payer: PRIVATE HEALTH INSURANCE | Admitting: Family Medicine

## 2013-05-09 ENCOUNTER — Ambulatory Visit (INDEPENDENT_AMBULATORY_CARE_PROVIDER_SITE_OTHER): Payer: PRIVATE HEALTH INSURANCE | Admitting: Emergency Medicine

## 2013-05-09 VITALS — BP 140/80 | HR 108 | Temp 98.4°F | Resp 18 | Ht 63.5 in | Wt 181.0 lb

## 2013-05-09 DIAGNOSIS — J209 Acute bronchitis, unspecified: Secondary | ICD-10-CM

## 2013-05-09 DIAGNOSIS — J309 Allergic rhinitis, unspecified: Secondary | ICD-10-CM

## 2013-05-09 DIAGNOSIS — J018 Other acute sinusitis: Secondary | ICD-10-CM

## 2013-05-09 MED ORDER — TRIAMCINOLONE ACETONIDE(NASAL) 55 MCG/ACT NA INHA: 2.0000 | Freq: Every day | NASAL | Status: DC

## 2013-05-09 MED ORDER — HYDROCOD POLST-CHLORPHEN POLST 10-8 MG/5ML PO LQCR: 5.0000 mL | Freq: Two times a day (BID) | ORAL | Status: DC | PRN

## 2013-05-09 MED ORDER — AMOXICILLIN-POT CLAVULANATE 875-125 MG PO TABS: 1.0000 | ORAL_TABLET | Freq: Two times a day (BID) | ORAL | Status: DC

## 2013-05-09 NOTE — Progress Notes (Signed)
[facility] [address], [place] [address] [phone]  Date:  05/09/2013   Name:  Melinda Peterson   DOB:  04/07/1951   MRN:  [id number]  PCP:  Dow Adolph, MD    Chief Complaint: Sinus Problem   History of Present Illness:  Melinda Peterson is a 62 y.o. very pleasant female patient who presents with the following:  Ill with history of sinusitis in past. Now to the office with a history of cough and post nasal drainage.   Says she has a low grade fever and feels chilled,  Has malaise and pain and pressure in her cheeks.  Cough is non productive and she has pain in her upper teeth.  No wheezing or shortness of breath.  No improvement with over the counter medications or other home remedies. Denies other complaint or health concern today.   Patient Active Problem List   Diagnosis Date Noted  . Tobacco user 04/17/2013  . Chronic recurrent sinusitis 01/06/2012  . GERD (gastroesophageal reflux disease) 12/26/2011  . IBS (irritable bowel syndrome) 12/26/2011  . Diverticulosis of colon 12/26/2011  . Degenerative disc disease, cervical 12/26/2011  . Obesity (BMI 30.0-34.9) 12/26/2011  . HTN (hypertension) 11/29/2011  . Gout 11/29/2011  . Mood disorder 11/29/2011    Past Medical History  Diagnosis Date  . Allergy   . Anxiety   . Eczema   . Irritable bowel syndrome   . Osteopenia   . Hyperlipidemia   . Arthritis   . Depression   . Neuromuscular disorder   . Osteoporosis     Past Surgical History  Procedure Laterality Date  . Abdominal hysterectomy    . Left shoulder surgery    . Colonoscopy w/ polypectomy  2009, 2012  . Breast surgery    . Tubal ligation      History  Substance Use Topics  . Smoking status: Current Every Day Smoker -- 0.30 packs/day  . Smokeless tobacco: Not on file  . Alcohol Use: Yes     Comment: social    History reviewed. No pertinent family history.  Allergies  Allergen Reactions  . Clarithromycin Hives  .  Lincomycin Hcl Hives  . Lisinopril Swelling    Medication list has been reviewed and updated.  Current Outpatient Prescriptions on File Prior to Visit  Medication Sig Dispense Refill  . allopurinol (ZYLOPRIM) 300 MG tablet TAKE 1 TABLET BY MOUTH EVERY DAY  30 tablet  3  . ALPRAZolam (XANAX) 0.5 MG tablet TAKE 1 TABLET BY MOUTH 3 TIMES A DAY AS NEEDED FOR ANXIETY  90 tablet  1  . aspirin 81 MG tablet Take 81 mg by mouth daily.      Marland Kitchen atorvastatin (LIPITOR) 10 MG tablet Take 1 tablet (10 mg total) by mouth daily. PATIENT NEEDS OFFICE VISIT/LABS FOR ADDITIONAL REFILLS  30 tablet  0  . cyclobenzaprine (FLEXERIL) 10 MG tablet TAKE 1 TABLET BY MOUTH TWICE A DAY AS NEEDED FOR MUSCLE SPASMS  60 tablet  1  . dextromethorphan-guaiFENesin (MUCINEX DM) 30-600 MG per 12 hr tablet Take 1 tablet by mouth every 12 (twelve) hours.      . diclofenac (VOLTAREN) 75 MG EC tablet TAKE 1 TABLET (75 MG TOTAL) BY MOUTH 2 (TWO) TIMES DAILY. TAKE WITH FOOD.  60 tablet  2  . FLUoxetine (PROZAC) 40 MG capsule TAKE ONE CAPSULE BY MOUTH EVERY DAY  30 capsule  3  . hyoscyamine (LEVBID) 0.375 MG 12 hr tablet TAKE 1 TABLET BY  MOUTH EVERY 12 HOURS AS NEEDED FOR CRAMPING  60 tablet  3  . ipratropium (ATROVENT HFA) 17 MCG/ACT inhaler Inhale 2 puffs into the lungs every 6 (six) hours.  1 Inhaler  5  . Melatonin 10 MG CAPS Take 10 mg by mouth at bedtime.      . mometasone (NASONEX) 50 MCG/ACT nasal spray Place 2 sprays into the nose daily.      . montelukast (SINGULAIR) 10 MG tablet Take 1 tablet (10 mg total) by mouth at bedtime.  30 tablet  5  . Multiple Vitamin (MULTIVITAMIN) capsule Take 1 capsule by mouth daily.      Marland Kitchen NEXIUM 40 MG capsule TAKE ONE CAPSULE BY MOUTH TWICE A DAY  60 capsule  2  . OVER THE COUNTER MEDICATION OTC Vitamin D 3 2000 mg daily      . PRESCRIPTION MEDICATION Apply 4 mg/mL topically daily. C-Estradiol HRT compound cream; this is compounded at [facility] ph# [phone].      Marland Kitchen SUMAtriptan  (IMITREX) 100 MG tablet TAKE 1 TABLET BY MOUTH EVERY 2 HOURS AS NEEDED FOR MIGRAINE  10 tablet  2  . valsartan-hydrochlorothiazide (DIOVAN-HCT) 160-25 MG per tablet Take 1 tablet by mouth daily to lower blood pressure.  30 tablet  5  . zolpidem (AMBIEN) 10 MG tablet TAKE 1 TABLET BY MOUTH AT BEDTIME FOR SLEEP  30 tablet  1   No current facility-administered medications on file prior to visit.    Review of Systems:  As per HPI, otherwise negative.    Physical Examination: Filed Vitals:   05/09/13 1554  BP: 140/80  Pulse: 108  Temp: 98.4 F (36.9 C)  Resp: 18   Filed Vitals:   05/09/13 1554  Height: 5' 3.5" (1.613 m)  Weight: 181 lb (82.101 kg)   Body mass index is 31.56 kg/(m^2). Ideal Body Weight: Weight in (lb) to have BMI = 25: 143.1  GEN: WDWN, NAD, Non-toxic, A & O x 3 HEENT: Atraumatic, Normocephalic. Neck supple. No masses, No LAD. Ears and Nose: No external deformity. CV: RRR, No M/G/R. No JVD. No thrill. No extra heart sounds. PULM: CTA B, no wheezes, crackles, rhonchi. No retractions. No resp. distress. No accessory muscle use. ABD: S, NT, ND, +BS. No rebound. No HSM. EXTR: No c/c/e NEURO Normal gait.  PSYCH: Normally interactive. Conversant. Not depressed or anxious appearing.  Calm demeanor.    Assessment and Plan: Sinusitis augmentin tussionex nasacort Bronchitis Seasonal allergic rhinitis  Signed,  Phillips Odor, MD

## 2013-05-09 NOTE — Patient Instructions (Signed)
bBronchitis Bronchitis is the body's way of reacting to injury and/or infection (inflammation) of the bronchi. Bronchi are the air tubes that extend from the windpipe into the lungs. If the inflammation becomes severe, it may cause shortness of breath. CAUSES  Inflammation may be caused by:  A virus.  Germs (bacteria).  Dust.  Allergens.  Pollutants and many other irritants. The cells lining the bronchial tree are covered with tiny hairs (cilia). These constantly beat upward, away from the lungs, toward the mouth. This keeps the lungs free of pollutants. When these cells become too irritated and are unable to do their job, mucus begins to develop. This causes the characteristic cough of bronchitis. The cough clears the lungs when the cilia are unable to do their job. Without either of these protective mechanisms, the mucus would settle in the lungs. Then you would develop pneumonia. Smoking is a common cause of bronchitis and can contribute to pneumonia. Stopping this habit is the single most important thing you can do to help yourself. TREATMENT   Your caregiver may prescribe an antibiotic if the cough is caused by bacteria. Also, medicines that open up your airways make it easier to breathe. Your caregiver may also recommend or prescribe an expectorant. It will loosen the mucus to be coughed up. Only take over-the-counter or prescription medicines for pain, discomfort, or fever as directed by your caregiver.  Removing whatever causes the problem (smoking, for example) is critical to preventing the problem from getting worse.  Cough suppressants may be prescribed for relief of cough symptoms.  Inhaled medicines may be prescribed to help with symptoms now and to help prevent problems from returning.  For those with recurrent (chronic) bronchitis, there may be a need for steroid medicines. SEEK IMMEDIATE MEDICAL CARE IF:   During treatment, you develop more pus-like mucus (purulent  sputum).  You have a fever.  Your baby is older than 3 months with a rectal temperature of 102 F (38.9 C) or higher.  Your baby is 3 months old or younger with a rectal temperature of 100.4 F (38 C) or higher.  You become progressively more ill.  You have increased difficulty breathing, wheezing, or shortness of breath. It is necessary to seek immediate medical care if you are elderly or sick from any other disease. MAKE SURE YOU:   Understand these instructions.  Will watch your condition.  Will get help right away if you are not doing well or get worse. Document Released: 07/30/2005 Document Revised: 10/22/2011 Document Reviewed: 06/08/2008 Exit[employer] Patient Information 2014 [employer]. Sinusitis Sinusitis is redness, soreness, and swelling (inflammation) of the paranasal sinuses. Paranasal sinuses are air pockets within the bones of your face (beneath the eyes, the middle of the forehead, or above the eyes). In healthy paranasal sinuses, mucus is able to drain out, and air is able to circulate through them by way of your nose. However, when your paranasal sinuses are inflamed, mucus and air can become trapped. This can allow bacteria and other germs to grow and cause infection. Sinusitis can develop quickly and last only a short time (acute) or continue over a long period (chronic). Sinusitis that lasts for more than 12 weeks is considered chronic.  CAUSES  Causes of sinusitis include:  Allergies.  Structural abnormalities, such as displacement of the cartilage that separates your nostrils (deviated septum), which can decrease the air flow through your nose and sinuses and affect sinus drainage.  Functional abnormalities, such as when the small hairs (cilia)   that line your sinuses and help remove mucus do not work properly or are not present. SYMPTOMS  Symptoms of acute and chronic sinusitis are the same. The primary symptoms are pain and pressure around the affected  sinuses. Other symptoms include:  Upper toothache.  Earache.  Headache.  Bad breath.  Decreased sense of smell and taste.  A cough, which worsens when you are lying flat.  Fatigue.  Fever.  Thick drainage from your nose, which often is green and may contain pus (purulent).  Swelling and warmth over the affected sinuses. DIAGNOSIS  Your caregiver will perform a physical exam. During the exam, your caregiver may:  Look in your nose for signs of abnormal growths in your nostrils (nasal polyps).  Tap over the affected sinus to check for signs of infection.  View the inside of your sinuses (endoscopy) with a special imaging device with a light attached (endoscope), which is inserted into your sinuses. If your caregiver suspects that you have chronic sinusitis, one or more of the following tests may be recommended:  Allergy tests.  Nasal culture A sample of mucus is taken from your nose and sent to a lab and screened for bacteria.  Nasal cytology A sample of mucus is taken from your nose and examined by your caregiver to determine if your sinusitis is related to an allergy. TREATMENT  Most cases of acute sinusitis are related to a viral infection and will resolve on their own within 10 days. Sometimes medicines are prescribed to help relieve symptoms (pain medicine, decongestants, nasal steroid sprays, or saline sprays).  However, for sinusitis related to a bacterial infection, your caregiver will prescribe antibiotic medicines. These are medicines that will help kill the bacteria causing the infection.  Rarely, sinusitis is caused by a fungal infection. In theses cases, your caregiver will prescribe antifungal medicine. For some cases of chronic sinusitis, surgery is needed. Generally, these are cases in which sinusitis recurs more than 3 times per year, despite other treatments. HOME CARE INSTRUCTIONS   Drink plenty of water. Water helps thin the mucus so your sinuses can drain  more easily.  Use a humidifier.  Inhale steam 3 to 4 times a day (for example, sit in the bathroom with the shower running).  Apply a warm, moist washcloth to your face 3 to 4 times a day, or as directed by your caregiver.  Use saline nasal sprays to help moisten and clean your sinuses.  Take over-the-counter or prescription medicines for pain, discomfort, or fever only as directed by your caregiver. SEEK IMMEDIATE MEDICAL CARE IF:  You have increasing pain or severe headaches.  You have nausea, vomiting, or drowsiness.  You have swelling around your face.  You have vision problems.  You have a stiff neck.  You have difficulty breathing. MAKE SURE YOU:   Understand these instructions.  Will watch your condition.  Will get help right away if you are not doing well or get worse. Document Released: 07/30/2005 Document Revised: 10/22/2011 Document Reviewed: 08/14/2011 [employer] Patient Information 2014 [employer].  

## 2013-05-11 ENCOUNTER — Other Ambulatory Visit: Payer: Self-pay | Admitting: Family Medicine

## 2013-05-14 ENCOUNTER — Ambulatory Visit: Payer: PRIVATE HEALTH INSURANCE | Admitting: Family Medicine

## 2013-05-14 NOTE — Telephone Encounter (Signed)
Alprazolam and zolpidem refills phoned to pt's pharmacy.

## 2013-05-24 ENCOUNTER — Other Ambulatory Visit: Payer: Self-pay | Admitting: Physician Assistant

## 2013-06-08 ENCOUNTER — Ambulatory Visit (INDEPENDENT_AMBULATORY_CARE_PROVIDER_SITE_OTHER): Payer: PRIVATE HEALTH INSURANCE | Admitting: Gastroenterology

## 2013-06-08 ENCOUNTER — Encounter: Payer: Self-pay | Admitting: Gastroenterology

## 2013-06-08 VITALS — BP 110/74 | HR 120 | Ht 62.5 in | Wt 180.4 lb

## 2013-06-08 DIAGNOSIS — K5900 Constipation, unspecified: Secondary | ICD-10-CM | POA: Insufficient documentation

## 2013-06-08 DIAGNOSIS — Z860100 Personal history of colon polyps, unspecified: Secondary | ICD-10-CM | POA: Insufficient documentation

## 2013-06-08 NOTE — Patient Instructions (Addendum)
You have been given a separate informational sheet regarding your tobacco use, the importance of quitting and local resources to help you quit. Linzess samples given Call if you develop diarrhea Follow up in 4 weeks

## 2013-06-08 NOTE — Assessment & Plan Note (Signed)
Plan follow-up colonoscopy 2017 

## 2013-06-08 NOTE — Progress Notes (Signed)
History of Present Illness: 62 year old white female referred for evaluation of abdominal pain.  She complains of frequent pyrosis, bloating and intermittent left lower quadrant pain.  Symptoms are improved when she moves her bowels.  She suffers from chronic constipation and may go up to a week without a bowel movement.  There is no history of rectal bleeding.  2012 she underwent upper and lower endoscopy.  The former demonstrated mild gastritis.  Random biopsies were taken and a sessile serrated adenoma was identified.  She apparently had polyps in 2008.  She takes Nexium 40 mg twice a day and hyoscyamine twice a day.  She has frequent belching but little pyrosis.  She may have mild nausea but does not vomit.  She is on no gastric irritants.    Past Medical History  Diagnosis Date  . Allergy   . Anxiety   . Eczema   . Irritable bowel syndrome   . Osteopenia   . Hyperlipidemia   . Arthritis   . Depression   . Neuromuscular disorder   . Osteoporosis   . Chronic headaches   . Colon polyps   . Diverticulosis   . GERD (gastroesophageal reflux disease)   . HTN (hypertension)    Past Surgical History  Procedure Laterality Date  . Total abdominal hysterectomy    . Acromioclavicularplasty    . Colonoscopy w/ polypectomy  2009, 2012  . Breast surgery Left   . Tubal ligation    . Skin cancer excision      lower back   family history includes Colon polyps in her brother and father; Heart disease in her mother; Stroke in her mother. Current Outpatient Prescriptions  Medication Sig Dispense Refill  . allopurinol (ZYLOPRIM) 300 MG tablet TAKE 1 TABLET BY MOUTH EVERY DAY  30 tablet  3  . ALPRAZolam (XANAX) 0.5 MG tablet TAKE 1 TABLET BY MOUTH 3 TIMES A DAY AS NEEDED ANXIETY  90 tablet  1  . aspirin 81 MG tablet Take 81 mg by mouth daily.      Marland Kitchen atorvastatin (LIPITOR) 10 MG tablet Take 1 tablet (10 mg total) by mouth daily.  30 tablet  0  . cyclobenzaprine (FLEXERIL) 10 MG tablet TAKE 1  TABLET BY MOUTH TWICE A DAY AS NEEDED FOR MUSCLE SPASMS  60 tablet  1  . dextromethorphan-guaiFENesin (MUCINEX DM) 30-600 MG per 12 hr tablet Take 1 tablet by mouth every 12 (twelve) hours.      . diclofenac (VOLTAREN) 75 MG EC tablet TAKE 1 TABLET (75 MG TOTAL) BY MOUTH 2 (TWO) TIMES DAILY. TAKE WITH FOOD.  60 tablet  2  . FLUoxetine (PROZAC) 40 MG capsule TAKE ONE CAPSULE BY MOUTH EVERY DAY  30 capsule  3  . hyoscyamine (LEVBID) 0.375 MG 12 hr tablet TAKE 1 TABLET BY MOUTH EVERY 12 HOURS AS NEEDED FOR CRAMPING  60 tablet  3  . Melatonin 10 MG CAPS Take 10 mg by mouth at bedtime.      . mometasone (NASONEX) 50 MCG/ACT nasal spray Place 2 sprays into the nose daily.      . montelukast (SINGULAIR) 10 MG tablet Take 1 tablet (10 mg total) by mouth at bedtime.  30 tablet  5  . Multiple Vitamin (MULTIVITAMIN) capsule Take 1 capsule by mouth daily.      Marland Kitchen NEXIUM 40 MG capsule TAKE ONE CAPSULE BY MOUTH TWICE A DAY  60 capsule  2  . OVER THE COUNTER MEDICATION OTC Vitamin D 3 2000 mg  daily      . PRESCRIPTION MEDICATION Apply 4 mg/mL topically daily. C-Estradiol HRT compound cream; this is compounded at [facility] ph# [phone].      Marland Kitchen SUMAtriptan (IMITREX) 100 MG tablet TAKE 1 TABLET BY MOUTH EVERY 2 HOURS AS NEEDED FOR MIGRAINE  10 tablet  4  . triamcinolone (NASACORT AQ) 55 MCG/ACT nasal inhaler Place 2 sprays into the nose daily.  1 Inhaler  12  . valsartan-hydrochlorothiazide (DIOVAN-HCT) 160-25 MG per tablet Take 1 tablet by mouth daily to lower blood pressure.  30 tablet  5  . zolpidem (AMBIEN) 10 MG tablet TAKE 1 TABLET BY MOUTH AT BEDTIME FOR SLEEP  30 tablet  1  . ipratropium (ATROVENT HFA) 17 MCG/ACT inhaler Inhale 2 puffs into the lungs every 6 (six) hours.  1 Inhaler  5   No current facility-administered medications for this visit.   Allergies as of 06/08/2013 - Review Complete 06/08/2013  Allergen Reaction Noted  . Biaxin [clarithromycin]  06/08/2013  . Clarithromycin Hives  11/07/2011  . Lincomycin hcl Hives 11/07/2011  . Lisinopril Swelling 11/07/2011    reports that she has been smoking Cigarettes.  She has been smoking about 0.30 packs per day. She has never used smokeless tobacco. She reports that she drinks alcohol. She reports that she does not use illicit drugs.     Review of Systems: Pertinent positive and negative review of systems were noted in the above HPI section. All other review of systems were otherwise negative.  Vital signs were reviewed in today's medical record Physical Exam: General: Well developed , well nourished, no acute distress Skin: anicteric Head: Normocephalic and atraumatic Eyes:  sclerae anicteric, EOMI Ears: Normal auditory acuity Mouth: No deformity or lesions Neck: Supple, no masses or thyromegaly Lungs: Clear throughout to auscultation Heart: Regular rate and rhythm; no murmurs, rubs or bruits Abdomen: Soft, non tender and non distended. No masses, hepatosplenomegaly or hernias noted. Normal Bowel sounds Rectal:deferred Musculoskeletal: Symmetrical with no gross deformities  Skin: No lesions on visible extremities Pulses:  Normal pulses noted Extremities: No clubbing, cyanosis, edema or deformities noted Neurological: Alert oriented x 4, grossly nonfocal Cervical Nodes:  No significant cervical adenopathy Inguinal Nodes: No significant inguinal adenopathy Psychological:  Alert and cooperative. Normal mood and affect

## 2013-06-08 NOTE — Assessment & Plan Note (Addendum)
I suspect that constipation is the underlying problem causing her multiple GI complaints.  Symptom sound more like IBS/constipation than CIC.  Recommendations #1 trial of Linzess 290 grams daily.  Patient was instructed to stop the medicine and call if she develops diarrhea #2 continue other medications for the time being although she was instructed to take Nexium before breakfast and dinner

## 2013-06-11 ENCOUNTER — Other Ambulatory Visit: Payer: Self-pay | Admitting: Physician Assistant

## 2013-06-18 ENCOUNTER — Other Ambulatory Visit: Payer: Self-pay

## 2013-06-21 ENCOUNTER — Other Ambulatory Visit: Payer: Self-pay | Admitting: Physician Assistant

## 2013-06-21 ENCOUNTER — Other Ambulatory Visit: Payer: Self-pay | Admitting: Family Medicine

## 2013-07-12 ENCOUNTER — Other Ambulatory Visit: Payer: Self-pay | Admitting: Family Medicine

## 2013-07-14 ENCOUNTER — Ambulatory Visit (INDEPENDENT_AMBULATORY_CARE_PROVIDER_SITE_OTHER): Payer: PRIVATE HEALTH INSURANCE | Admitting: Gastroenterology

## 2013-07-14 ENCOUNTER — Encounter: Payer: Self-pay | Admitting: Gastroenterology

## 2013-07-14 VITALS — BP 120/88 | HR 104 | Ht 63.0 in | Wt 185.0 lb

## 2013-07-14 DIAGNOSIS — K219 Gastro-esophageal reflux disease without esophagitis: Secondary | ICD-10-CM

## 2013-07-14 DIAGNOSIS — K5900 Constipation, unspecified: Secondary | ICD-10-CM

## 2013-07-14 NOTE — Progress Notes (Signed)
History of Present Illness:  The patient has returned for followup of constipation.  Although she was given samples of Linzess she's not had the opportunity to begin this medication in earnest.  Pattern of several days of constipation followed by multiple poorly formed stools and lower abdominal pain continues.  She has intermittent pyrosis which is improved with Nexium twice a day.    Review of Systems: Pertinent positive and negative review of systems were noted in the above HPI section. All other review of systems were otherwise negative.    Current Medications, Allergies, Past Medical History, Past Surgical History, Family History and Social History were reviewed in [facility] electronic medical record  Vital signs were reviewed in today's medical record. Physical Exam: General: Well developed , well nourished, no acute distress

## 2013-07-14 NOTE — Telephone Encounter (Signed)
Phoned to pt's pharmacy: Alprazolam 0.5 mg  #90 w/ 1 refill and Zolpidem 10 mg  #30 w/ 1 refill.

## 2013-07-14 NOTE — Patient Instructions (Signed)
Follow up as needed

## 2013-07-14 NOTE — Assessment & Plan Note (Signed)
Symptoms are fairly well-controlled with Nexium twice a day.  Plan to continue with the same.

## 2013-07-14 NOTE — Assessment & Plan Note (Signed)
Patient will try taking Linzess regularly.  I will reevaluate in several weeks.

## 2013-07-20 ENCOUNTER — Other Ambulatory Visit: Payer: Self-pay | Admitting: Family Medicine

## 2013-07-20 ENCOUNTER — Other Ambulatory Visit: Payer: Self-pay | Admitting: Physician Assistant

## 2013-07-29 ENCOUNTER — Ambulatory Visit (INDEPENDENT_AMBULATORY_CARE_PROVIDER_SITE_OTHER): Payer: PRIVATE HEALTH INSURANCE | Admitting: Family Medicine

## 2013-07-29 ENCOUNTER — Encounter: Payer: Self-pay | Admitting: Family Medicine

## 2013-07-29 VITALS — BP 144/86 | HR 118 | Temp 98.1°F | Resp 16 | Ht 63.0 in | Wt 185.0 lb

## 2013-07-29 DIAGNOSIS — E781 Pure hyperglyceridemia: Secondary | ICD-10-CM

## 2013-07-29 DIAGNOSIS — H547 Unspecified visual loss: Secondary | ICD-10-CM

## 2013-07-29 DIAGNOSIS — D649 Anemia, unspecified: Secondary | ICD-10-CM

## 2013-07-29 DIAGNOSIS — J0191 Acute recurrent sinusitis, unspecified: Secondary | ICD-10-CM

## 2013-07-29 DIAGNOSIS — L989 Disorder of the skin and subcutaneous tissue, unspecified: Secondary | ICD-10-CM

## 2013-07-29 DIAGNOSIS — I10 Essential (primary) hypertension: Secondary | ICD-10-CM

## 2013-07-29 DIAGNOSIS — J019 Acute sinusitis, unspecified: Secondary | ICD-10-CM

## 2013-07-29 DIAGNOSIS — Z1231 Encounter for screening mammogram for malignant neoplasm of breast: Secondary | ICD-10-CM

## 2013-07-29 DIAGNOSIS — Z13820 Encounter for screening for osteoporosis: Secondary | ICD-10-CM

## 2013-07-29 LAB — CBC
MCH: 30.3 pg (ref 26.0–34.0)
MCV: 90.5 fL (ref 78.0–100.0)
Platelets: 327 10*3/uL (ref 150–400)
RBC: 4.09 MIL/uL (ref 3.87–5.11)
RDW: 14.3 % (ref 11.5–15.5)
WBC: 6.6 10*3/uL (ref 4.0–10.5)

## 2013-07-29 LAB — POCT GLYCOSYLATED HEMOGLOBIN (HGB A1C): Hemoglobin A1C: 5.5

## 2013-07-29 LAB — COMPLETE METABOLIC PANEL WITH GFR
ALT: 29 U/L (ref 0–35)
Albumin: 4.2 g/dL (ref 3.5–5.2)
BUN: 15 mg/dL (ref 6–23)
CO2: 27 mEq/L (ref 19–32)
Calcium: 9.3 mg/dL (ref 8.4–10.5)
Chloride: 102 mEq/L (ref 96–112)
Creat: 0.78 mg/dL (ref 0.50–1.10)
GFR, Est African American: >89 mL/min
GFR, Est Non African American: 81 mL/min
Glucose, Bld: 83 mg/dL (ref 70–99)
Sodium: 138 mEq/L (ref 135–145)
Total Protein: 6.7 g/dL (ref 6.0–8.3)

## 2013-07-29 LAB — LIPID PANEL
Cholesterol: 219 mg/dL — ABNORMAL HIGH (ref 0–200)
HDL: 54 mg/dL (ref >39)

## 2013-07-29 MED ORDER — AMOXICILLIN-POT CLAVULANATE 875-125 MG PO TABS: 1.0000 | ORAL_TABLET | Freq: Two times a day (BID) | ORAL | Status: DC

## 2013-07-29 MED ORDER — ATORVASTATIN CALCIUM 10 MG PO TABS: ORAL_TABLET | ORAL | Status: DC

## 2013-07-29 NOTE — Progress Notes (Signed)
S:  This 62 y.o. Cauc female is here for medication refills and follow-up for HTN, lipid disorder and recurrent sinusitis. Pt notes 5-lb weight gain due to poor nutrition and lack of exercise. She and her husband, who is diabetic, plan to improve fitness and lifestyle next year. Pt is complaint w/ medications w/o adverse effects. She will have a fasting lipid panel rechecked today.  Today, pt c/o HA, frontal sinus pressure, voice change w/ hoarseness, mildly prod cough and low-grade fever. She was treated for bronchitis in late Sept 2014 w/ Augmentin which worked well for her.   Family stressors are preoccupying pt; her younger sister has been hospitalized in [place] for stabilization of a mental health disorder. Pt not able to visit her because she does not have a passport. This concern has increased pt's IBS symptoms; Dr. Arlyce Dice gave pt samples of Linzess which pt has not been taking as directed. Also, her nutrition has aggravated symptoms. She plans to start trial of medication and f-u with GI as advised.  Pt requests DERM referral for herself and her husband; they both have moles and other skin lesions that need surveillance by a skin care specialist. She also requests Ophthalmology referral; she wears corrective lenses and was told that she has "hint of an early cataract". Her husband needs diabetic eye evaluation. She also needs mammogram rescheduled as she did not have it done last year when ordered; she is also requesting bone density study.   Patient Active Problem List   Diagnosis Date Noted  . Unspecified constipation 06/08/2013  . Personal history of colonic polyps 06/08/2013  . Tobacco user 04/17/2013  . Chronic recurrent sinusitis 01/06/2012  . GERD (gastroesophageal reflux disease) 12/26/2011  . IBS (irritable bowel syndrome) 12/26/2011  . Diverticulosis of colon 12/26/2011  . Degenerative disc disease, cervical 12/26/2011  . Obesity (BMI 30.0-34.9) 12/26/2011  . HTN (hypertension)  11/29/2011  . Gout 11/29/2011  . Mood disorder 11/29/2011   PMHx, Soc and Fam Hx reviewed.  ROS: As per HPI; negative for diaphoresis, vision changes, CP or tightness, palpitations, SOB or DOE, edema, dizziness, weakness, tremor, numbness or syncope.  O: Filed Vitals:   07/29/13 1043  BP: 144/86  Pulse: 118  Temp: 98.1 F (36.7 C)  Resp: 16   GEN: In NAD; WN,WD. Weight stable. HEENT: [place]/AT; EOMI w/ mildly injected conj and clear sclerae. EACs/nasal mucosa/oroph clear. Post ph w/ mild erythema w/o exudate or lesions. Sinuses NT. NECK: Supple w/o LAN or TMG. LUNGS: CTA; no rhonchi or wheezes. SKIN: W&D; intact w/o diaphoresis, rashes or pallor. MS: MAEs; no joint deformities or effusions. Pt sits cross-legged on exam table w/o difficulty. NEURO: A&O x 3; CNs intact. Gait is normal. Nonfocal.  A/P: Anemia, mild - Pt concerned about hx of mild anemia; last Hgb in Sept 2014 = 11.7 g/dL. Plan: CBC, COMPLETE METABOLIC PANEL WITH GFR  Hypertriglyceridemia - Encouraged healthier lifestyle practices. Plan: Lipid panel, POCT glycosylated hemoglobin (Hb A1C), COMPLETE METABOLIC PANEL WITH GFR  HTN (hypertension) - Stable on current medication; no change at this time. Plan: COMPLETE METABOLIC PANEL WITH GFR  Recurrent acute sinusitis- RX: Augmentin 875-125 MG  1 tab bid w/ food. Continue other symptomatic treatment.  Skin lesions, generalized - Plan: Ambulatory referral to Dermatology  Vision impairment - Plan: Ambulatory referral to Ophthalmology  Screening for osteoporosis - Plan: DG Bone Density  Other screening mammogram - Plan: MM Digital Screening  Meds ordered this encounter  Medications  . atorvastatin (LIPITOR) 10 MG  tablet    Sig: TAKE 1 TABLET BY MOUTH AT BEDTIME.    Dispense:  30 tablet    Refill:  5  . amoxicillin-clavulanate (AUGMENTIN) 875-125 MG per tablet    Sig: Take 1 tablet by mouth 2 (two) times daily.    Dispense:  20 tablet    Refill:  0

## 2013-07-29 NOTE — Patient Instructions (Signed)
Hypertriglyceridemia  Diet for High blood levels of Triglycerides Most fats in food are triglycerides. Triglycerides in your blood are stored as fat in your body. High levels of triglycerides in your blood may put you at a greater risk for heart disease and stroke.  Normal triglyceride levels are less than 150 mg/dL. Borderline high levels are 150-199 mg/dl. High levels are 200 - 499 mg/dL, and very high triglyceride levels are greater than 500 mg/dL. The decision to treat high triglycerides is generally based on the level. For people with borderline or high triglyceride levels, treatment includes weight loss and exercise. Drugs are recommended for people with very high triglyceride levels. Many people who need treatment for high triglyceride levels have metabolic syndrome. This syndrome is a collection of disorders that often include: insulin resistance, high blood pressure, blood clotting problems, high cholesterol and triglycerides. TESTING PROCEDURE FOR TRIGLYCERIDES  You should not eat 4 hours before getting your triglycerides measured. The normal range of triglycerides is between 10 and 250 milligrams per deciliter (mg/dl). Some people may have extreme levels (1000 or above), but your triglyceride level may be too high if it is above 150 mg/dl, depending on what other risk factors you have for heart disease.  People with high blood triglycerides may also have high blood cholesterol levels. If you have high blood cholesterol as well as high blood triglycerides, your risk for heart disease is probably greater than if you only had high triglycerides. High blood cholesterol is one of the main risk factors for heart disease. CHANGING YOUR DIET  Your weight can affect your blood triglyceride level. If you are more than 20% above your ideal body weight, you may be able to lower your blood triglycerides by losing weight. Eating less and exercising regularly is the best way to combat this. Fat provides more  calories than any other food. The best way to lose weight is to eat less fat. Only 30% of your total calories should come from fat. Less than 7% of your diet should come from saturated fat. A diet low in fat and saturated fat is the same as a diet to decrease blood cholesterol. By eating a diet lower in fat, you may lose weight, lower your blood cholesterol, and lower your blood triglyceride level.  Eating a diet low in fat, especially saturated fat, may also help you lower your blood triglyceride level. Ask your dietitian to help you figure how much fat you can eat based on the number of calories your caregiver has prescribed for you.  Exercise, in addition to helping with weight loss may also help lower triglyceride levels.   Alcohol can increase blood triglycerides. You may need to stop drinking alcoholic beverages.  Too much carbohydrate in your diet may also increase your blood triglycerides. Some complex carbohydrates are necessary in your diet. These may include bread, rice, potatoes, other starchy vegetables and cereals.  Reduce "simple" carbohydrates. These may include pure sugars, candy, honey, and jelly without losing other nutrients. If you have the kind of high blood triglycerides that is affected by the amount of carbohydrates in your diet, you will need to eat less sugar and less high-sugar foods. Your caregiver can help you with this.  Adding 2-4 grams of fish oil (EPA+ DHA) may also help lower triglycerides. Speak with your caregiver before adding any supplements to your regimen. Following the Diet  Maintain your ideal weight. Your caregivers can help you with a diet. Generally, eating less food and getting more   exercise will help you lose weight. Joining a weight control group may also help. Ask your caregivers for a good weight control group in your area.  Eat low-fat foods instead of high-fat foods. This can help you lose weight too.  These foods are lower in fat. Eat MORE of these:    Dried beans, peas, and lentils.  Egg whites.  Low-fat cottage cheese.  Fish.  Lean cuts of meat, such as round, sirloin, rump, and flank (cut extra fat off meat you fix).  Whole grain breads, cereals and pasta.  Skim and nonfat dry milk.  Low-fat yogurt.  Poultry without the skin.  Cheese made with skim or part-skim milk, such as mozzarella, parmesan, farmers', ricotta, or pot cheese. These are higher fat foods. Eat LESS of these:   Whole milk and foods made from whole milk, such as American, blue, cheddar, monterey jack, and swiss cheese  High-fat meats, such as luncheon meats, sausages, knockwurst, bratwurst, hot dogs, ribs, corned beef, ground pork, and regular ground beef.  Fried foods. Limit saturated fats in your diet. Substituting unsaturated fat for saturated fat may decrease your blood triglyceride level. You will need to read package labels to know which products contain saturated fats.  These foods are high in saturated fat. Eat LESS of these:   Fried pork skins.  Whole milk.  Skin and fat from poultry.  Palm oil.  Butter.  Shortening.  Cream cheese.  Bacon.  Margarines and baked goods made from listed oils.  Vegetable shortenings.  Chitterlings.  Fat from meats.  Coconut oil.  Palm kernel oil.  Lard.  Cream.  Sour cream.  Fatback.  Coffee whiteners and non-dairy creamers made with these oils.  Cheese made from whole milk. Use unsaturated fats (both polyunsaturated and monounsaturated) moderately. Remember, even though unsaturated fats are better than saturated fats; you still want a diet low in total fat.  These foods are high in unsaturated fat:   Canola oil.  Sunflower oil.  Mayonnaise.  Almonds.  Peanuts.  Pine nuts.  Margarines made with these oils.  Safflower oil.  Olive oil.  Avocados.  Cashews.  Peanut butter.  Sunflower seeds.  Soybean oil.  Peanut  oil.  Olives.  Pecans.  Walnuts.  Pumpkin seeds. Avoid sugar and other high-sugar foods. This will decrease carbohydrates without decreasing other nutrients. Sugar in your food goes rapidly to your blood. When there is excess sugar in your blood, your liver may use it to make more triglycerides. Sugar also contains calories without other important nutrients.  Eat LESS of these:   Sugar, brown sugar, powdered sugar, jam, jelly, preserves, honey, syrup, molasses, pies, candy, cakes, cookies, frosting, pastries, colas, soft drinks, punches, fruit drinks, and regular gelatin.  Avoid alcohol. Alcohol, even more than sugar, may increase blood triglycerides. In addition, alcohol is high in calories and low in nutrients. Ask for sparkling water, or a diet soft drink instead of an alcoholic beverage. Suggestions for planning and preparing meals   Bake, broil, grill or roast meats instead of frying.  Remove fat from meats and skin from poultry before cooking.  Add spices, herbs, lemon juice or vinegar to vegetables instead of salt, rich sauces or gravies.  Use a non-stick skillet without fat or use no-stick sprays.  Cool and refrigerate stews and broth. Then remove the hardened fat floating on the surface before serving.  Refrigerate meat drippings and skim off fat to make low-fat gravies.  Serve more fish.  Use less butter,   margarine and other high-fat spreads on bread or vegetables.  Use skim or reconstituted non-fat dry milk for cooking.  Cook with low-fat cheeses.  Substitute low-fat yogurt or cottage cheese for all or part of the sour cream in recipes for sauces, dips or congealed salads.  Use half yogurt/half mayonnaise in salad recipes.  Substitute evaporated skim milk for cream. Evaporated skim milk or reconstituted non-fat dry milk can be whipped and substituted for whipped cream in certain recipes.  Choose fresh fruits for dessert instead of high-fat foods such as pies or  cakes. Fruits are naturally low in fat. When Dining Out   Order low-fat appetizers such as fruit or vegetable juice, pasta with vegetables or tomato sauce.  Select clear, rather than cream soups.  Ask that dressings and gravies be served on the side. Then use less of them.  Order foods that are baked, broiled, poached, steamed, stir-fried, or roasted.  Ask for margarine instead of butter, and use only a small amount.  Drink sparkling water, unsweetened tea or coffee, or diet soft drinks instead of alcohol or other sweet beverages. QUESTIONS AND ANSWERS ABOUT OTHER FATS IN THE BLOOD: SATURATED FAT, TRANS FAT, AND CHOLESTEROL What is trans fat? Trans fat is a type of fat that is formed when vegetable oil is hardened through a process called hydrogenation. This process helps makes foods more solid, gives them shape, and prolongs their shelf life. Trans fats are also called hydrogenated or partially hydrogenated oils.  What do saturated fat, trans fat, and cholesterol in foods have to do with heart disease? Saturated fat, trans fat, and cholesterol in the diet all raise the level of LDL "bad" cholesterol in the blood. The higher the LDL cholesterol, the greater the risk for coronary heart disease (CHD). Saturated fat and trans fat raise LDL similarly.  What foods contain saturated fat, trans fat, and cholesterol? High amounts of saturated fat are found in animal products, such as fatty cuts of meat, chicken skin, and full-fat dairy products like butter, whole milk, cream, and cheese, and in tropical vegetable oils such as palm, palm kernel, and coconut oil. Trans fat is found in some of the same foods as saturated fat, such as vegetable shortening, some margarines (especially hard or stick margarine), crackers, cookies, baked goods, fried foods, salad dressings, and other processed foods made with partially hydrogenated vegetable oils. Small amounts of trans fat also occur naturally in some animal  products, such as milk products, beef, and lamb. Foods high in cholesterol include liver, other organ meats, egg yolks, shrimp, and full-fat dairy products. How can I use the new food label to make heart-healthy food choices? Check the Nutrition Facts panel of the food label. Choose foods lower in saturated fat, trans fat, and cholesterol. For saturated fat and cholesterol, you can also use the Percent Daily Value (%DV): 5% DV or less is low, and 20% DV or more is high. (There is no %DV for trans fat.) Use the Nutrition Facts panel to choose foods low in saturated fat and cholesterol, and if the trans fat is not listed, read the ingredients and limit products that list shortening or hydrogenated or partially hydrogenated vegetable oil, which tend to be high in trans fat. POINTS TO REMEMBER:   Discuss your risk for heart disease with your caregivers, and take steps to reduce risk factors.  Change your diet. Choose foods that are low in saturated fat, trans fat, and cholesterol.  Add exercise to your daily routine if   it is not already being done. Participate in physical activity of moderate intensity, like brisk walking, for at least 30 minutes on most, and preferably all days of the week. No time? Break the 30 minutes into three, 10-minute segments during the day.  Stop smoking. If you do smoke, contact your caregiver to discuss ways in which they can help you quit.  Do not use street drugs.  Maintain a normal weight.  Maintain a healthy blood pressure.  Keep up with your blood work for checking the fats in your blood as directed by your caregiver. Document Released: 05/17/2004 Document Revised: 01/29/2012 Document Reviewed: 12/13/2008 [facility] Patient Information 2014 [place], [place].   Your test for Diabetes was normal; A1c= 5.5%.  Continue to work on weight loss and getting regular exercise. This will prevent onset of Diabetes as well as help with cardiovascular health and overall  wellness.

## 2013-07-30 ENCOUNTER — Ambulatory Visit: Payer: PRIVATE HEALTH INSURANCE | Admitting: Family Medicine

## 2013-08-02 ENCOUNTER — Encounter: Payer: Self-pay | Admitting: Family Medicine

## 2013-08-10 ENCOUNTER — Telehealth: Payer: Self-pay

## 2013-08-10 DIAGNOSIS — Z780 Asymptomatic menopausal state: Secondary | ICD-10-CM

## 2013-08-10 DIAGNOSIS — Z1239 Encounter for other screening for malignant neoplasm of breast: Secondary | ICD-10-CM

## 2013-08-10 NOTE — Telephone Encounter (Signed)
Patient would like to know lab results, she says it has been 2 weeks. She would also like to know if we have scheduled her mammogram and bone density test.

## 2013-08-11 ENCOUNTER — Other Ambulatory Visit: Payer: Self-pay | Admitting: Family Medicine

## 2013-08-11 NOTE — Telephone Encounter (Signed)
Lab results were sent via my chart, advised her of the labs. Have reordered the MM and the bone density study so these will go to the breast center and she can get these done.

## 2013-08-12 NOTE — Telephone Encounter (Signed)
Cyclobenzaprine refilled- #60 w/ 2 refills.

## 2013-08-12 NOTE — Telephone Encounter (Signed)
Dr Audria Nine, OK to give pt RFs? You just saw pt this month but I don't see this med specifically discussed lately.

## 2013-08-25 ENCOUNTER — Ambulatory Visit: Payer: PRIVATE HEALTH INSURANCE | Admitting: Gastroenterology

## 2013-08-27 ENCOUNTER — Telehealth: Payer: Self-pay

## 2013-08-27 NOTE — Telephone Encounter (Signed)
Patient requesting refill request on her lipitor 20 mg if possible [phone]

## 2013-08-28 NOTE — Telephone Encounter (Signed)
Pt is calling again regarding her atorvastatin 20mg 's, she states that she is completely out and was told last week that it was going to be called in. Please advise. Best# [phone]

## 2013-08-29 NOTE — Telephone Encounter (Signed)
Left message to return call. On 07/29/13 Dr. Leward Quan refilled with 5 refills. I called pharmacy and they do have RX. Pharmacist said that there is an old RX on file for this medication so not sure if she is trying to refill old one. Pharmacy will have ready for p/u.

## 2013-09-01 MED ORDER — ATORVASTATIN CALCIUM 20 MG PO TABS: ORAL_TABLET | ORAL | Status: DC

## 2013-09-01 NOTE — Telephone Encounter (Signed)
Spoke to patient- per [employer] from Dr. Leward Quan patient was to increase Lipitor to 20mg  qd. She needed this script sent into the pharmacy.   Sent in correct dose for pt. She is aware. Pt request information on resetting her mychart password- I have mailed it to her with the results of her labs in December.

## 2013-09-11 ENCOUNTER — Other Ambulatory Visit: Payer: Self-pay | Admitting: Family Medicine

## 2013-09-11 NOTE — Telephone Encounter (Signed)
Medication refills (Alprazolam and Zolpidem) completed; pt notified via MyChart.

## 2013-10-05 ENCOUNTER — Ambulatory Visit: Payer: PRIVATE HEALTH INSURANCE | Admitting: Gastroenterology

## 2013-10-18 ENCOUNTER — Other Ambulatory Visit: Payer: Self-pay | Admitting: Family Medicine

## 2013-10-22 ENCOUNTER — Telehealth: Payer: Self-pay

## 2013-10-27 ENCOUNTER — Other Ambulatory Visit: Payer: Self-pay | Admitting: Family Medicine

## 2013-11-04 ENCOUNTER — Encounter: Payer: PRIVATE HEALTH INSURANCE | Admitting: Family Medicine

## 2013-11-12 ENCOUNTER — Other Ambulatory Visit: Payer: Self-pay | Admitting: Family Medicine

## 2013-11-12 NOTE — Telephone Encounter (Signed)
Opened in error

## 2013-11-13 NOTE — Telephone Encounter (Signed)
Alprazolam and Zolpidem refills phoned to pt's pharmacy. Pt has appt on November 19, 2013 for OV and appt on Dec 11, 2013 for CPE. Please cancel April 9th appointment and notify pt that she has CPE scheduled on May 1st.   Thanks.

## 2013-11-16 ENCOUNTER — Other Ambulatory Visit: Payer: Self-pay | Admitting: Family Medicine

## 2013-11-17 NOTE — Telephone Encounter (Signed)
Pt advised she wants to keep both

## 2013-11-19 ENCOUNTER — Ambulatory Visit: Payer: PRIVATE HEALTH INSURANCE | Admitting: Family Medicine

## 2013-11-26 ENCOUNTER — Ambulatory Visit: Payer: PRIVATE HEALTH INSURANCE | Admitting: Family Medicine

## 2013-12-04 ENCOUNTER — Ambulatory Visit: Payer: PRIVATE HEALTH INSURANCE | Admitting: Family Medicine

## 2013-12-08 ENCOUNTER — Ambulatory Visit: Payer: PRIVATE HEALTH INSURANCE | Admitting: Family Medicine

## 2013-12-11 ENCOUNTER — Encounter: Payer: PRIVATE HEALTH INSURANCE | Admitting: Family Medicine

## 2013-12-11 ENCOUNTER — Other Ambulatory Visit: Payer: Self-pay | Admitting: Family Medicine

## 2013-12-11 NOTE — Telephone Encounter (Signed)
Dr Leward Quan, [place] to RF until 6 mos check up due?

## 2013-12-11 NOTE — Telephone Encounter (Signed)
Allopurinol refilled for 4 months.  Pt has appt 12/15/13 for med refill and then appt in June for CPE.  I will see if 12/15/13 appt is really needed.

## 2013-12-15 ENCOUNTER — Ambulatory Visit: Payer: PRIVATE HEALTH INSURANCE | Admitting: Family Medicine

## 2013-12-22 ENCOUNTER — Other Ambulatory Visit: Payer: Self-pay | Admitting: Physician Assistant

## 2013-12-25 ENCOUNTER — Ambulatory Visit (INDEPENDENT_AMBULATORY_CARE_PROVIDER_SITE_OTHER): Payer: PRIVATE HEALTH INSURANCE | Admitting: Family Medicine

## 2013-12-25 VITALS — BP 115/78 | HR 90 | Temp 98.5°F | Resp 16 | Ht 63.0 in | Wt 195.0 lb

## 2013-12-25 DIAGNOSIS — J019 Acute sinusitis, unspecified: Secondary | ICD-10-CM

## 2013-12-25 MED ORDER — AMOXICILLIN-POT CLAVULANATE 875-125 MG PO TABS: 1.0000 | ORAL_TABLET | Freq: Two times a day (BID) | ORAL | Status: DC

## 2013-12-25 NOTE — Patient Instructions (Signed)
Let me know if you are not feeling better in the next few days- Sooner if worse.   Use the augmentin as directed for 10 dyas

## 2013-12-25 NOTE — Progress Notes (Signed)
[facility] [address]  [place] [address] [phone]  Date:  12/25/2013   Name:  Melinda Peterson   DOB:  May 16, 1951   MRN:  [id number]  PCP:  Ellsworth Lennox, MD    Chief Complaint: Sinusitis and Adenopathy   History of Present Illness:  Melinda Peterson is a 63 y.o. very pleasant female patient who presents with the following:  She is here today with illness.  She has noted illness for a couple of weeks.  She has noted some swelling and tenderness in her glands, and a "little bump" in my throat.  Her left ear feels uncomfortable, but not painful.  She can just feel when she swallows in her throat.   She is getting some PND down the back of her throat.   She does have some cough- seems to be PND.   She is sneezing a lot.   She is going out of town soon.   This am her temp was 99.  She has felt sort of cold- maybe having some chills.   She was ill in March, but got better on her own.    She has not had any vomiting or diarrhea.    Patient Active Problem List   Diagnosis Date Noted  . Unspecified constipation 06/08/2013  . Personal history of colonic polyps 06/08/2013  . Tobacco user 04/17/2013  . Chronic recurrent sinusitis 01/06/2012  . GERD (gastroesophageal reflux disease) 12/26/2011  . IBS (irritable bowel syndrome) 12/26/2011  . Diverticulosis of colon 12/26/2011  . Degenerative disc disease, cervical 12/26/2011  . Obesity (BMI 30.0-34.9) 12/26/2011  . HTN (hypertension) 11/29/2011  . Gout 11/29/2011  . Mood disorder 11/29/2011    Past Medical History  Diagnosis Date  . Allergy   . Anxiety   . Eczema   . Irritable bowel syndrome   . Osteopenia   . Hyperlipidemia   . Arthritis   . Depression   . Neuromuscular disorder   . Osteoporosis   . Chronic headaches   . Colon polyps   . Diverticulosis   . GERD (gastroesophageal reflux disease)   . HTN (hypertension)     Past Surgical History  Procedure Laterality Date  . Total abdominal  hysterectomy    . Acromioclavicularplasty    . Colonoscopy w/ polypectomy  2009, 2012  . Breast surgery Left   . Tubal ligation    . Skin cancer excision      lower back    History  Substance Use Topics  . Smoking status: Current Every Day Smoker -- 0.30 packs/day    Types: Cigarettes  . Smokeless tobacco: Never Used  . Alcohol Use: Yes     Comment: social    Family History  Problem Relation Age of Onset  . Colon polyps Father   . Colon polyps Brother   . Heart disease Mother   . Stroke Mother     Allergies  Allergen Reactions  . Biaxin [Clarithromycin]   . Clarithromycin Hives  . Lincomycin Hcl Hives  . Lisinopril Swelling    Medication list has been reviewed and updated.  Current Outpatient Prescriptions on File Prior to Visit  Medication Sig Dispense Refill  . allopurinol (ZYLOPRIM) 300 MG tablet TAKE 1 TABLET BY MOUTH EVERY DAY  30 tablet  3  . ALPRAZolam (XANAX) 0.5 MG tablet TAKE 1 TABLET BY MOUTH 3 TIMES A DAY AS NEEDED FOR ANXIETY  90 tablet  1  . amoxicillin-clavulanate (AUGMENTIN) 875-125 MG per tablet Take  1 tablet by mouth 2 (two) times daily.  20 tablet  0  . aspirin 81 MG tablet Take 81 mg by mouth daily.      Marland Kitchen atorvastatin (LIPITOR) 20 MG tablet TAKE 1 TABLET BY MOUTH AT BEDTIME.  30 tablet  5  . cyclobenzaprine (FLEXERIL) 10 MG tablet TAKE 1 TABLET BY MOUTH TWICE A DAY AS NEEDED FOR MUSCLE SPASMS  60 tablet  2  . dextromethorphan-guaiFENesin (MUCINEX DM) 30-600 MG per 12 hr tablet Take 1 tablet by mouth every 12 (twelve) hours.      . diclofenac (VOLTAREN) 75 MG EC tablet TAKE 1 TABLET (75 MG TOTAL) BY MOUTH 2 (TWO) TIMES DAILY. TAKE WITH FOOD.  60 tablet  2  . FLUoxetine (PROZAC) 40 MG capsule TAKE ONE CAPSULE BY MOUTH EVERY DAY  30 capsule  5  . hyoscyamine (LEVBID) 0.375 MG 12 hr tablet TAKE 1 TABLET BY MOUTH EVERY 12 HOURS AS NEEDED FOR CRAMPING.  60 tablet  2  . ipratropium (ATROVENT HFA) 17 MCG/ACT inhaler Inhale 2 puffs into the lungs every 6  (six) hours.  1 Inhaler  5  . Melatonin 10 MG CAPS Take 10 mg by mouth at bedtime.      . mometasone (NASONEX) 50 MCG/ACT nasal spray Place 2 sprays into the nose daily.      . montelukast (SINGULAIR) 10 MG tablet Take 1 tablet (10 mg total) by mouth at bedtime.  30 tablet  5  . Multiple Vitamin (MULTIVITAMIN) capsule Take 1 capsule by mouth daily.      Marland Kitchen NEXIUM 40 MG capsule TAKE ONE CAPSULE BY MOUTH TWICE A DAY  60 capsule  3  . OVER THE COUNTER MEDICATION OTC Vitamin D 3 2000 mg daily      . PRESCRIPTION MEDICATION Apply 4 mg/mL topically daily. C-Estradiol HRT compound cream; this is compounded at [facility] ph# [phone].      Marland Kitchen SUMAtriptan (IMITREX) 100 MG tablet TAKE 1 TABLET BY MOUTH EVERY 2 HOURS AS NEEDED FOR MIGRAINE  10 tablet  4  . triamcinolone (NASACORT AQ) 55 MCG/ACT nasal inhaler Place 2 sprays into the nose daily.  1 Inhaler  12  . valsartan-hydrochlorothiazide (DIOVAN-HCT) 160-25 MG per tablet TAKE 1 TABLET BY MOUTH EVERY DAY TO LOWER BLOOD PRESSURE  30 tablet  2  . zolpidem (AMBIEN) 10 MG tablet TAKE 1 TABLET BY MOUTH AT BEDTIME AS NEEDED FOR SLEEP  30 tablet  1   No current facility-administered medications on file prior to visit.    Review of Systems:  As per HPI- otherwise negative  Physical Examination: Filed Vitals:   12/25/13 1706  BP: 115/78  Pulse: 100  Temp: 98.5 F (36.9 C)  Resp: 16   Filed Vitals:   12/25/13 1706  Height: 5\' 3"  (1.6 m)  Weight: 195 lb (88.451 kg)   Body mass index is 34.55 kg/(m^2). Ideal Body Weight: Weight in (lb) to have BMI = 25: 140.8  GEN: WDWN, NAD, Non-toxic, A & O x 3, overweight, looks well HEENT: Atraumatic, Normocephalic. Neck supple. No masses.  Bilateral TM wnl, oropharynx normal.  PEERL,EOMI.  She has tender LAD under the left jaw.   Ears and Nose: No external deformity. CV: RRR, No M/G/R. No JVD. No thrill. No extra heart sounds. PULM: CTA B, no wheezes, crackles, rhonchi. No retractions. No resp.  distress. No accessory muscle use. ABD: S, NT, ND EXTR: No c/c/e NEURO Normal gait.  PSYCH: Normally interactive. Conversant. Not depressed  or anxious appearing.  Calm demeanor.    Assessment and Plan: Sinusitis, acute - Plan: amoxicillin-clavulanate (AUGMENTIN) 875-125 MG per tablet treat for persistent sinus sx with augmentin.   Signed Lamar Blinks, MD

## 2013-12-27 ENCOUNTER — Other Ambulatory Visit: Payer: Self-pay | Admitting: Family Medicine

## 2013-12-28 MED ORDER — DICLOFENAC SODIUM 75 MG PO TBEC: DELAYED_RELEASE_TABLET | ORAL | Status: DC

## 2013-12-28 NOTE — Addendum Note (Signed)
Addended by: Elwyn Reach A on: 12/28/2013 12:49 PM   Modules accepted: Orders

## 2013-12-28 NOTE — Telephone Encounter (Signed)
Pt has appt 01/13/14 and Dr Leward Quan just OKd RF of another med until appt, so will send this in for pt.

## 2014-01-12 ENCOUNTER — Other Ambulatory Visit: Payer: Self-pay | Admitting: Family Medicine

## 2014-01-13 ENCOUNTER — Encounter: Payer: Self-pay | Admitting: Family Medicine

## 2014-01-13 ENCOUNTER — Ambulatory Visit (INDEPENDENT_AMBULATORY_CARE_PROVIDER_SITE_OTHER): Payer: PRIVATE HEALTH INSURANCE | Admitting: Family Medicine

## 2014-01-13 VITALS — BP 130/90 | HR 109 | Temp 97.8°F | Resp 16 | Ht 62.75 in | Wt 193.4 lb

## 2014-01-13 DIAGNOSIS — R0609 Other forms of dyspnea: Secondary | ICD-10-CM

## 2014-01-13 DIAGNOSIS — I10 Essential (primary) hypertension: Secondary | ICD-10-CM

## 2014-01-13 DIAGNOSIS — R000 Tachycardia, unspecified: Secondary | ICD-10-CM

## 2014-01-13 DIAGNOSIS — R0600 Dyspnea, unspecified: Secondary | ICD-10-CM

## 2014-01-13 DIAGNOSIS — R0989 Other specified symptoms and signs involving the circulatory and respiratory systems: Secondary | ICD-10-CM

## 2014-01-13 DIAGNOSIS — Z23 Encounter for immunization: Secondary | ICD-10-CM

## 2014-01-13 DIAGNOSIS — E785 Hyperlipidemia, unspecified: Secondary | ICD-10-CM

## 2014-01-13 DIAGNOSIS — Z0000 Encounter for general adult medical examination without abnormal findings: Secondary | ICD-10-CM

## 2014-01-13 LAB — COMPLETE METABOLIC PANEL WITH GFR
ALT: 31 U/L (ref 0–35)
AST: 23 U/L (ref 0–37)
Albumin: 4.1 g/dL (ref 3.5–5.2)
Alkaline Phosphatase: 117 U/L (ref 39–117)
BILIRUBIN TOTAL: 0.5 mg/dL (ref 0.2–1.2)
BUN: 14 mg/dL (ref 6–23)
CHLORIDE: 103 meq/L (ref 96–112)
CO2: 26 meq/L (ref 19–32)
Calcium: 9.2 mg/dL (ref 8.4–10.5)
Creat: 0.96 mg/dL (ref 0.50–1.10)
GFR, EST AFRICAN AMERICAN: 72 mL/min
GFR, EST NON AFRICAN AMERICAN: 63 mL/min
GLUCOSE: 95 mg/dL (ref 70–99)
Potassium: 3.7 mEq/L (ref 3.5–5.3)
SODIUM: 140 meq/L (ref 135–145)
Total Protein: 6.8 g/dL (ref 6.0–8.3)

## 2014-01-13 LAB — LIPID PANEL
Cholesterol: 204 mg/dL — ABNORMAL HIGH (ref 0–200)
HDL: 45 mg/dL (ref >39)
LDL Cholesterol: 90 mg/dL (ref 0–99)
Total CHOL/HDL Ratio: 4.5 Ratio
Triglycerides: 347 mg/dL — ABNORMAL HIGH (ref <150)
VLDL: 69 mg/dL — ABNORMAL HIGH (ref 0–40)

## 2014-01-13 MED ORDER — ATORVASTATIN CALCIUM 20 MG PO TABS: ORAL_TABLET | ORAL | Status: DC

## 2014-01-13 MED ORDER — ZOLPIDEM TARTRATE 10 MG PO TABS: ORAL_TABLET | ORAL | Status: DC

## 2014-01-13 MED ORDER — ALPRAZOLAM 0.5 MG PO TABS: ORAL_TABLET | ORAL | Status: DC

## 2014-01-13 MED ORDER — VALSARTAN-HYDROCHLOROTHIAZIDE 160-25 MG PO TABS: ORAL_TABLET | ORAL | Status: DC

## 2014-01-13 MED ORDER — HYOSCYAMINE SULFATE ER 0.375 MG PO TB12: ORAL_TABLET | ORAL | Status: DC

## 2014-01-13 MED ORDER — FLUOXETINE HCL 40 MG PO CAPS: ORAL_CAPSULE | ORAL | Status: DC

## 2014-01-13 NOTE — Patient Instructions (Signed)
Keeping You Healthy  Get These Tests  Blood Pressure- Have your blood pressure checked by your healthcare provider at least once a year.  Normal blood pressure is 120/80.  Weight- Have your body mass index (BMI) calculated to screen for obesity.  BMI is a measure of body fat based on height and weight.  You can calculate your own BMI at www.nhlbisupport.com/bmi/  Cholesterol- Have your cholesterol checked every year.  Diabetes- Have your blood sugar checked every year if you have high blood pressure, high cholesterol, a family history of diabetes or if you are overweight.  Pap Smear- Have a pap smear every 1 to 3 years if you have been sexually active.  If you are older than 65 and recent pap smears have been normal you may not need additional pap smears.  In addition, if you have had a hysterectomy  For benign disease additional pap smears are not necessary.  Mammogram-Yearly mammograms are essential for early detection of breast cancer  Screening for Colon Cancer- Colonoscopy starting at age 50. Screening may begin sooner depending on your family history and other health conditions.  Follow up colonoscopy as directed by your Gastroenterologist.  Screening for Osteoporosis- Screening begins at age 65 with bone density scanning, sooner if you are at higher risk for developing Osteoporosis.  Get these medicines  Calcium with Vitamin D- Your body requires 1200-1500 mg of Calcium a day and 800-1000 IU of Vitamin D a day.  You can only absorb 500 mg of Calcium at a time therefore Calcium must be taken in 2 or 3 separate doses throughout the day.  Hormones- Hormone therapy has been associated with increased risk for certain cancers and heart disease.  Talk to your healthcare provider about if you need relief from menopausal symptoms.  Aspirin- Ask your healthcare provider about taking Aspirin to prevent Heart Disease and Stroke.  Get these Immuniztions  Flu shot- Every fall  Pneumonia  shot- Once after the age of 65; if you are younger ask your healthcare provider if you need a pneumonia shot.  Tetanus- Every ten years.  Zostavax- Once after the age of 60 to prevent shingles.  Take these steps  Don't smoke- Your healthcare provider can help you quit. For tips on how to quit, ask your healthcare provider or go to www.smokefree.gov or call 1-800 QUIT-NOW.  Be physically active- Exercise 5 days a week for a minimum of 30 minutes.  If you are not already physically active, start slow and gradually work up to 30 minutes of moderate physical activity.  Try walking, dancing, bike riding, swimming, etc.  Eat a healthy diet- Eat a variety of healthy foods such as fruits, vegetables, whole grains, low fat milk, low fat cheeses, yogurt, lean meats, chicken, fish, eggs, dried beans, tofu, etc.  For more information go to www.thenutritionsource.org  Dental visit- Brush and floss teeth twice daily; visit your dentist twice a year.  Eye exam- Visit your Optometrist or Ophthalmologist yearly.  Drink alcohol in moderation- Limit alcohol intake to one drink or less a day.  Never drink and drive.  Depression- Your emotional health is as important as your physical health.  If you're feeling down or losing interest in things you normally enjoy, please talk to your healthcare provider.  Seat Belts- can save your life; always wear one  Smoke/Carbon Monoxide detectors- These detectors need to be installed on the appropriate level of your home.  Replace batteries at least once a year.  Violence- If anyone   is threatening or hurting you, please tell your healthcare provider.  Living Will/ Health care power of attorney- Discuss with your healthcare provider and family.    Women and Heart Disease Heart disease (HD) risk factors for both men and women are similar. Most studies addressing the diagnosis and treatment of heart disease have focused primarily on men. More research is now being done  on women and heart disease.  GENDER DIFFERENCES  Symptoms of a heart attack for women may be more subtle, less typical and harder to identify.  Women are often slow to recognize heart disease risk factors and symptoms.  More women than men die from a heart attack before reaching a hospital.  Results of medical tests can vary by gender, especially electrocardiogram stress testing.  A common perception is that men are more likely to have heart problems. This is not true, especially in women after menopause.  Effects of estrogen, birth control and hormone therapy can have unique effects on the heart.  Women are more likely to be referred for a mental health evaluation of their symptoms. CAUSES Heart disease may be caused from conditions such as:  Buildup of fat-like deposits (plaques) in the blood vessels (coronary arteries) of the heart. The build up of fat-like deposits cause blockages that decrease the blood flow to the heart muscle.  Blockage or narrowing of the coronary arteries decreases oxygen to the heart muscle.  Abnormal heart rhythms or problems with the electrical system of the heart.  Heart muscle that has become enlarged or weak and cannot pump well.  Abnormal heart valves that either leak or are thickened and do not open and close properly.  Damage from infection or drugs.  Heart problems present at birth. RISK FACTORS  Family history.  Elevated blood lipid levels (cholesterol).  High blood pressure.  Diabetes.  Smoking.  Inactivity, lack of exercise.  Weighing 30% more than your ideal weight.  Age.  Past history of heart problems. SYMPTOMS  Chest discomfort or pressure, which may include the following:  Discomfort, fullness, tightness, squeezing in center of chest that stays for a few minutes or comes and goes.  Discomfort or pressure that spreads to upper back, shoulders, neck, jaw or stomach.  Discomfort or tingling in the arms.  Profuse or  clammy sweating.  Difficulty breathing.  Nausea.  Feeling your heart "flutter" or "jump."  Unexplained feelings of anxiety, fatigue or weakness.  Dizziness. DIAGNOSIS Diagnosis may include a test that:  Records the electrical activity of the heart and looks for changes (EKG [electrocardiogram]) .  Detects the presence of special proteins and enzymes that may show damage to the heart muscle (blood tests).  Looks at the blood flow through the heart and coronary arteries by using special dyes and X-rays (coronary angiography).  Uses sound waves to examine your heart valves, muscle function and blood flow within the heart (echo or echocardiogram).  Looks for symptoms as your heart works harder under stress (stress tests).  Creates images of the heart by detecting radiation following administration of a radioactive tracer (nuclear imaging).  Records the electrical activity of the heart and helps in detecting abnormalities of heart rhythm (electrophysiology).  Creates an image of the anatomy of the heart (CT heart scan). TREATMENT   Medications may be used to control your blood pressure, keep your heart beating regularly, reduce pain, and help your breathing.  If you are admitted to the hospital, the length of your stay depends on the amount of heart  damage and any complications you may have.  Severe heart problems may require open heart surgery. This is a procedure where blocked coronary arteries are bypassed with a vein from your leg.  If you have a single small coronary artery blockage and no heart damage, you may have a balloon angioplasty. This procedure uses stents that may help open the blockage and restore normal heart circulation. Stents are small, wire, mesh-like tubes that help keep the artery open.  If needed, blood thinners may be used to dissolve clots. HOME CARE INSTRUCTIONS  Follow the treatment plan your caregiver prescribes.  Keep a list of every medicine you  are taking. Keep it up to date and with you all the time.  Get help from your caregiver or pharmacist to learn the following about each medicine:  Why you are taking it.  What time of day to take it.  Possible side effects.  What foods to take with your medication and which foods to avoid.  When to stop taking your medication.  Try to maintain normal cholesterol levels.  Eat a heart healthy diet with salt and fat restrictions as advised. PREVENTION  You can reduce your risk of heart disease by doing the following:  Visit your caregiver regularly and determine whether you are at risk.  Quit smoking and keep away from those who smoke.  Get your blood pressure checked regularly and make sure it remains within normal limits.  Limit salt in your diet.  Maintain normal blood sugar and cholesterol levels.  Exercise regularly (walking or another form of aerobic activity, preferably 30 minutes continuously).  Maintain ideal body weight.  Reduce stress, anger, and depression.  If you have already had a heart attack, consult your caregiver about methods and medicines that may help in reducing your risk of having a second heart attack.  Be aware of the symptoms of heart disease and seek medical care if you develop these symptoms. SEEK IMMEDIATE MEDICAL CARE IF:  You have severe chest pain or the above symptoms, call your local emergency services (911 if in [place] THIS IS AN EMERGENCY. Do not wait to see if the pain will go away. DO NOT drive yourself to the hospital.  You notice increasing shortness of breath during rest, sleeping or with activity. Insist that providers take your complaints seriously and do a thorough heart evaluation. Document Released: 01/16/2008 Document Revised: 10/22/2011 Document Reviewed: 01/16/2008 [facility] Patient Information 2014 [place], [place].   Hypercholesterolemia High Blood Cholesterol Cholesterol is a white, waxy, fat-like protein needed by  your body in small amounts. The liver makes all the cholesterol you need. It is carried from the liver by the blood through the blood vessels. Deposits (plaque) may build up on blood vessel walls. This makes the arteries narrower and stiffer. Plaque increases the risk for heart attack and stroke. You cannot feel your cholesterol level even if it is very high. The only way to know is by a blood test to check your lipid (fats) levels. Once you know your cholesterol levels, you should keep a record of the test results. Work with your caregiver to to keep your levels in the desired range. WHAT THE RESULTS MEAN:  Total cholesterol is a rough measure of all the cholesterol in your blood.  LDL is the so-called bad cholesterol. This is the type that deposits cholesterol in the walls of the arteries. You want this level to be low.  HDL is the good cholesterol because it cleans the arteries and  carries the LDL away. You want this level to be high.  Triglycerides are fat that the body can either burn for energy or store. High levels are closely linked to heart disease. DESIRED LEVELS:  Total cholesterol below 200.  LDL below 100 for people at risk, below 70 for very high risk.  HDL above 50 is good, above 60 is best.  Triglycerides below 150. HOW TO LOWER YOUR CHOLESTEROL:  Diet.  Choose fish or white meat chicken and [place], roasted or baked. Limit fatty cuts of red meat, fried foods, and processed meats, such as sausage and lunch meat.  Eat lots of fresh fruits and vegetables. Choose whole grains, beans, pasta, potatoes and cereals.  Use only small amounts of olive, corn or canola oils. Avoid butter, mayonnaise, shortening or palm kernel oils. Avoid foods with trans-fats.  Use skim/nonfat milk and low-fat/nonfat yogurt and cheeses. Avoid whole milk, cream, ice cream, egg yolks and cheeses. Healthy desserts include angel food cake, gingersnaps, animal crackers, hard candy, popsicles, and  low-fat/nonfat frozen yogurt. Avoid pastries, cakes, pies and cookies.  Exercise.  A regular program helps decrease LDL and raises HDL.  Helps with weight control.  Do things that increase your activity level like gardening, walking, or taking the stairs.  Medication.  May be prescribed by your caregiver to help lowering cholesterol and the risk for heart disease.  You may need medicine even if your levels are normal if you have several risk factors. HOME CARE INSTRUCTIONS   Follow your diet and exercise programs as suggested by your caregiver.  Take medications as directed.  Have blood work done when your caregiver feels it is necessary. MAKE SURE YOU:   Understand these instructions.  Will watch your condition.  Will get help right away if you are not doing well or get worse. Document Released: 07/30/2005 Document Revised: 10/22/2011 Document Reviewed: 01/15/2007 [facility] Patient Information 2014 [place].
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
## 2014-01-28 ENCOUNTER — Telehealth: Payer: Self-pay

## 2014-01-28 NOTE — Telephone Encounter (Signed)
Pt called inquiring about lab results done on 6/3, please advise pt

## 2014-01-28 NOTE — Telephone Encounter (Signed)
Notes Recorded by Barton Fanny, MD on 01/15/2014 at 1:47 PM Metabolic profile is normal; cholesterol is controlled on current medication but triglycerides are still high. Omega-3 Fish Oil can help lower this number; check on the label to look for DHEA + EPA content = 1000- 2000 mg of fish oil. Take at least 2000 mg daily; take fish oil separate from other medications. We will recheck these numbers at your next visit.  This was sent via my chart. Patient did not get the message. Called her to advise.

## 2014-02-04 ENCOUNTER — Other Ambulatory Visit: Payer: Self-pay

## 2014-02-04 NOTE — Telephone Encounter (Signed)
Pharm reqs RFs of C-estrodiol 4 mg/ml HRT. Dr Leward Quan, do you want to RF this?

## 2014-02-09 NOTE — Telephone Encounter (Addendum)
I phoned pt's pharmacy and left a message authorizing refills for C-HRT compound cream. I will need to call the pharmacy again tomorrow so that this refill can be completed in EPIC (compounded products have hard stops and are difficult to complete.  I spoke w/ the pharmacist on 02/10/14 and confirmed medication dosing, quantity and refills.

## 2014-02-10 ENCOUNTER — Other Ambulatory Visit: Payer: Self-pay | Admitting: Family Medicine

## 2014-02-10 MED ORDER — PRESCRIPTION MEDICATION: 1.0000 mL | Freq: Every day | Status: DC

## 2014-02-10 NOTE — Telephone Encounter (Signed)
Dr Leward Quan, you just saw pt for check up but don't see this med discussed. Do you want to give RFs or RTC for eval?

## 2014-02-11 ENCOUNTER — Other Ambulatory Visit: Payer: Self-pay | Admitting: Family Medicine

## 2014-03-03 ENCOUNTER — Other Ambulatory Visit: Payer: Self-pay | Admitting: Physician Assistant

## 2014-03-03 NOTE — Telephone Encounter (Signed)
Dr Leward Quan, you saw pt in June, but don't see Nexium/GERD discussed. OK to RF?

## 2014-03-03 NOTE — Telephone Encounter (Signed)
Nexium refill authorized.

## 2014-03-15 ENCOUNTER — Other Ambulatory Visit: Payer: Self-pay | Admitting: Family Medicine

## 2014-03-17 NOTE — Telephone Encounter (Signed)
Zolpidem refill phoned to pt's pharmacy.

## 2014-04-14 ENCOUNTER — Other Ambulatory Visit: Payer: Self-pay | Admitting: Family Medicine

## 2014-04-15 NOTE — Telephone Encounter (Signed)
Alprazolam RF phoned to pt's pharmacy.

## 2014-04-16 ENCOUNTER — Ambulatory Visit: Payer: PRIVATE HEALTH INSURANCE | Admitting: Family Medicine

## 2014-04-22 ENCOUNTER — Ambulatory Visit: Payer: PRIVATE HEALTH INSURANCE | Admitting: Family Medicine

## 2014-04-28 ENCOUNTER — Other Ambulatory Visit: Payer: Self-pay | Admitting: Family Medicine

## 2014-05-11 ENCOUNTER — Other Ambulatory Visit: Payer: Self-pay | Admitting: Family Medicine

## 2014-05-11 ENCOUNTER — Ambulatory Visit: Payer: PRIVATE HEALTH INSURANCE | Admitting: Family Medicine

## 2014-05-13 ENCOUNTER — Ambulatory Visit (INDEPENDENT_AMBULATORY_CARE_PROVIDER_SITE_OTHER): Payer: PRIVATE HEALTH INSURANCE | Admitting: Emergency Medicine

## 2014-05-13 VITALS — BP 120/86 | HR 109 | Temp 97.6°F | Resp 16 | Ht 63.5 in | Wt 200.0 lb

## 2014-05-13 DIAGNOSIS — J019 Acute sinusitis, unspecified: Secondary | ICD-10-CM

## 2014-05-13 DIAGNOSIS — L309 Dermatitis, unspecified: Secondary | ICD-10-CM

## 2014-05-13 DIAGNOSIS — J069 Acute upper respiratory infection, unspecified: Secondary | ICD-10-CM

## 2014-05-13 MED ORDER — TRIAMCINOLONE ACETONIDE 0.1 % EX CREA: 1.0000 "application " | TOPICAL_CREAM | Freq: Two times a day (BID) | CUTANEOUS | Status: DC

## 2014-05-13 MED ORDER — AMOXICILLIN-POT CLAVULANATE 875-125 MG PO TABS: 1.0000 | ORAL_TABLET | Freq: Two times a day (BID) | ORAL | Status: DC

## 2014-05-13 MED ORDER — PROMETHAZINE-CODEINE 6.25-10 MG/5ML PO SYRP: 5.0000 mL | ORAL_SOLUTION | Freq: Four times a day (QID) | ORAL | Status: DC | PRN

## 2014-05-13 MED ORDER — PSEUDOEPHEDRINE-GUAIFENESIN ER 60-600 MG PO TB12: 1.0000 | ORAL_TABLET | Freq: Two times a day (BID) | ORAL | Status: DC

## 2014-05-13 NOTE — Addendum Note (Signed)
Addended by: Roselee Culver on: 05/13/2014 04:25 PM   Modules accepted: Orders

## 2014-05-13 NOTE — Patient Instructions (Signed)
Cold Sore  A cold sore (fever blister) is a skin infection caused by the herpes simplex virus (HSV-1). HSV-1 is closely related to the virus that causes genital herpes (HSV-2), but they are not the same even though both viruses can cause oral and genital infections. Cold sores are small, fluid-filled sores inside of the mouth or on the lips, gums, nose, chin, cheeks, or fingers.   The herpes simplex virus can be easily passed (contagious) to other people through close personal contact, such as kissing or sharing personal items. The virus can also spread to other parts of the body, such as the eyes or genitals. Cold sores are contagious until the sores crust over completely. They often heal within 2 weeks.   Once a person is infected, the herpes simplex virus remains permanently in the body. Therefore, there is no cure for cold sores, and they often recur when a person is tired, stressed, sick, or gets too much sun. Additional factors that can cause a recurrence include hormone changes in menstruation or pregnancy, certain drugs, and cold weather.   CAUSES   Cold sores are caused by the herpes simplex virus. The virus is spread from person to person through close contact, such as through kissing, touching the affected area, or sharing personal items such as lip balm, razors, or eating utensils.   SYMPTOMS   The first infection may not cause symptoms. If symptoms develop, the symptoms often go through different stages. Here is how a cold sore develops:   · Tingling, itching, or burning is felt 1-2 days before the outbreak.    · Fluid-filled blisters appear on the lips, inside the mouth, nose, or on the cheeks.    · The blisters start to ooze clear fluid.    · The blisters dry up and a yellow crust appears in its place.    · The crust falls off.    Symptoms depend on whether it is the initial outbreak or a recurrence. Some other symptoms with the first outbreak may include:   · Fever.    · Sore throat.    · Headache.     · Muscle aches.    · Swollen neck glands.    DIAGNOSIS   A diagnosis is often made based on your symptoms and looking at the sores. Sometimes, a sore may be swabbed and then examined in the lab to make a final diagnosis. If the sores are not present, blood tests can find the herpes simplex virus.   TREATMENT   There is no cure for cold sores and no vaccine for the herpes simplex virus. Within 2 weeks, most cold sores go away on their own without treatment. Medicines cannot make the infection go away, but medicine can help relieve some of the pain associated with the sores, can work to stop the virus from multiplying, and can also shorten healing time. Medicine may be in the form of creams, gels, pills, or a shot.   HOME CARE INSTRUCTIONS   · Only take over-the-counter or prescription medicines for pain, discomfort, or fever as directed by your caregiver. Do not use aspirin.    · Use a cotton-tip swab to apply creams or gels to your sores.    · Do not touch the sores or pick the scabs. Wash your hands often. Do not touch your eyes without washing your hands first.    · Avoid kissing, oral sex, and sharing personal items until sores heal.    · Apply an ice pack on your sores for 10-15 minutes to ease any discomfort.    ·   Avoid hot, cold, or salty foods because they may hurt your mouth. Eat a soft, bland diet to avoid irritating the sores. Use a straw to drink if you have pain when drinking out of a glass.    · Keep sores clean and dry to prevent an infection of other tissues.    · Avoid the sun and limit stress if these things trigger outbreaks. If sun causes cold sores, apply sunscreen on the lips before being out in the sun.    SEEK MEDICAL CARE IF:   · You have a fever or persistent symptoms for more than 2-3 days.    · You have a fever and your symptoms suddenly get worse.    · You have pus, not clear fluid, coming from the sores.    · You have redness that is spreading.    · You have pain or irritation in your  eye.    · You get sores on your genitals.    · Your sores do not heal within 2 weeks.    · You have a weakened immune system.    · You have frequent recurrences of cold sores.    MAKE SURE YOU:   · Understand these instructions.  · Will watch your condition.  · Will get help right away if you are not doing well or get worse.  Document Released: 07/27/2000 Document Revised: 12/14/2013 Document Reviewed: 12/12/2011  ExitCare® Patient Information ©2015 [employer]. This information is not intended to replace advice given to you by your health care provider. Make sure you discuss any questions you have with your health care provider.

## 2014-05-13 NOTE — Progress Notes (Addendum)
[facility] [address] [place] [address] [phone]  Date:  05/13/2014   Name:  Melinda Peterson   DOB:  [birth year]   MRN:  [id number]  PCP:  Ellsworth Lennox, MD    Chief Complaint: Nasal Congestion, Rash and Cough   History of Present Illness:  Melinda Peterson is a 63 y.o. very pleasant female patient who presents with the following:  Ill for past week with nasal congestion and pressure.  Post nasal drainage.  Mucopurulent in nature Sore throat.   No fever or chills.   Cough that is not productive.  No wheezing or shortness of breath.   No nausea or vomiting.  No stool change No improvement with over the counter medications or other home remedies.  Denies other complaint or health concern today.   Patient Active Problem List   Diagnosis Date Noted  . Other and unspecified hyperlipidemia 01/13/2014  . Unspecified constipation 06/08/2013  . Personal history of colonic polyps 06/08/2013  . Tobacco user 04/17/2013  . Chronic recurrent sinusitis 01/06/2012  . GERD (gastroesophageal reflux disease) 12/26/2011  . IBS (irritable bowel syndrome) 12/26/2011  . Diverticulosis of colon 12/26/2011  . Degenerative disc disease, cervical 12/26/2011  . Obesity (BMI 30.0-34.9) 12/26/2011  . HTN (hypertension) 11/29/2011  . Gout 11/29/2011  . Mood disorder 11/29/2011    Past Medical History  Diagnosis Date  . Allergy   . Anxiety   . Eczema   . Irritable bowel syndrome   . Osteopenia   . Hyperlipidemia   . Arthritis   . Depression   . Neuromuscular disorder   . Osteoporosis   . Chronic headaches   . Colon polyps   . Diverticulosis   . GERD (gastroesophageal reflux disease)   . HTN (hypertension)     Past Surgical History  Procedure Laterality Date  . Total abdominal hysterectomy    . Acromioclavicularplasty    . Colonoscopy w/ polypectomy  2009, 2012  . Breast surgery Left   . Tubal ligation    . Skin cancer excision      lower back     History  Substance Use Topics  . Smoking status: Former Smoker -- 0.30 packs/day    Types: Cigarettes  . Smokeless tobacco: Never Used  . Alcohol Use: Yes     Comment: social    Family History  Problem Relation Age of Onset  . Colon polyps Father   . Colon polyps Brother   . Heart disease Mother   . Stroke Mother     Allergies  Allergen Reactions  . Biaxin [Clarithromycin]   . Clarithromycin Hives  . Lincomycin Hcl Hives  . Lisinopril Swelling    Medication list has been reviewed and updated.  Current Outpatient Prescriptions on File Prior to Visit  Medication Sig Dispense Refill  . allopurinol (ZYLOPRIM) 300 MG tablet TAKE 1 TABLET BY MOUTH EVERY DAY  30 tablet  3  . ALPRAZolam (XANAX) 0.5 MG tablet TAKE 1 TABLET BY MOUTH 3 TIMES A DAY AS NEEDED FOR ANXIETY  90 tablet  2  . aspirin 81 MG tablet Take 81 mg by mouth daily.      Marland Kitchen atorvastatin (LIPITOR) 20 MG tablet TAKE 1 TABLET BY MOUTH AT BEDTIME.  30 tablet  5  . cyclobenzaprine (FLEXERIL) 10 MG tablet TAKE 1 TABLET BY MOUTH TWICE A DAY AS NEEDED FOR MUSCLE SPASMS  60 tablet  3  . dextromethorphan-guaiFENesin (MUCINEX DM) 30-600 MG per 12 hr tablet Take  1 tablet by mouth every 12 (twelve) hours.      . diclofenac (VOLTAREN) 75 MG EC tablet TAKE 1 TABLET (75 MG TOTAL) BY MOUTH 2 (TWO) TIMES DAILY. TAKE WITH FOOD.  60 tablet  0  . esomeprazole (NEXIUM) 40 MG capsule TAKE ONE CAPSULE BY MOUTH TWICE A DAY  60 capsule  5  . FLUoxetine (PROZAC) 40 MG capsule TAKE ONE CAPSULE BY MOUTH EVERY DAY  30 capsule  5  . hyoscyamine (LEVBID) 0.375 MG 12 hr tablet TAKE 1 TABLET BY MOUTH EVERY 12 HOURS AS NEEDED FOR CRAMPING.  60 tablet  2  . ipratropium (ATROVENT HFA) 17 MCG/ACT inhaler Inhale 2 puffs into the lungs every 6 (six) hours.  1 Inhaler  5  . Melatonin 10 MG CAPS Take 10 mg by mouth at bedtime.      . mometasone (NASONEX) 50 MCG/ACT nasal spray Place 2 sprays into the nose daily.      . montelukast (SINGULAIR) 10 MG tablet  Take 1 tablet (10 mg total) by mouth at bedtime.  30 tablet  5  . Multiple Vitamin (MULTIVITAMIN) capsule Take 1 capsule by mouth daily.      Marland Kitchen OVER THE COUNTER MEDICATION OTC Vitamin D 3 2000 mg daily      . PRESCRIPTION MEDICATION Apply 1 mL topically daily. C-Estradiol HRT compound cream; this is compounded at [facility] ph# [phone].  30 g  5  . SUMAtriptan (IMITREX) 100 MG tablet TAKE 1 TABLET BY MOUTH EVERY 2 HOURS AS NEEDED FOR MIGRAINE  10 tablet  4  . triamcinolone (NASACORT AQ) 55 MCG/ACT nasal inhaler Place 2 sprays into the nose daily.  1 Inhaler  12  . valsartan-hydrochlorothiazide (DIOVAN-HCT) 160-25 MG per tablet TAKE 1 TABLET BY MOUTH EVERY DAY TO LOWER BLOOD PRESSURE  30 tablet  5  . zolpidem (AMBIEN) 10 MG tablet TAKE 1 TABLET BY MOUTH AT BEDTIME AS NEEDED FOR SLEEP  30 tablet  1   No current facility-administered medications on file prior to visit.    Review of Systems:  As per HPI, otherwise negative.    Physical Examination: Filed Vitals:   05/13/14 1535  BP: 120/86  Pulse: 109  Temp: 97.6 F (36.4 C)  Resp: 16   Filed Vitals:   05/13/14 1535  Height: 5' 3.5" (1.613 m)  Weight: 200 lb (90.719 kg)   Body mass index is 34.87 kg/(m^2). Ideal Body Weight: Weight in (lb) to have BMI = 25: 143.1  GEN: WDWN, NAD, Non-toxic, A & O x 3 HEENT: Atraumatic, Normocephalic. Neck supple. No masses, No LAD. Ears and Nose: No external deformity. CV: RRR, No M/G/R. No JVD. No thrill. No extra heart sounds. PULM: CTA B, no wheezes, crackles, rhonchi. No retractions. No resp. distress. No accessory muscle use. ABD: S, NT, ND, +BS. No rebound. No HSM. EXTR: No c/c/e NEURO Normal gait.  PSYCH: Normally interactive. Conversant. Not depressed or anxious appearing.  Calm demeanor.    Assessment and Plan: Viral URI Phen c cod mucinex d Eczema TAC Signed,  Ellison Carwin, MD

## 2014-05-14 ENCOUNTER — Other Ambulatory Visit: Payer: Self-pay | Admitting: Family Medicine

## 2014-05-18 ENCOUNTER — Ambulatory Visit: Payer: PRIVATE HEALTH INSURANCE | Admitting: Family Medicine

## 2014-05-19 ENCOUNTER — Other Ambulatory Visit: Payer: Self-pay | Admitting: Family Medicine

## 2014-05-20 NOTE — Telephone Encounter (Signed)
Zolpidem refill phoned to pt's pharmacy.

## 2014-05-26 ENCOUNTER — Encounter (INDEPENDENT_AMBULATORY_CARE_PROVIDER_SITE_OTHER): Payer: PRIVATE HEALTH INSURANCE | Admitting: Ophthalmology

## 2014-05-26 ENCOUNTER — Ambulatory Visit (INDEPENDENT_AMBULATORY_CARE_PROVIDER_SITE_OTHER): Payer: PRIVATE HEALTH INSURANCE | Admitting: Emergency Medicine

## 2014-05-26 VITALS — BP 150/78 | HR 122 | Temp 99.1°F | Resp 18 | Ht 63.25 in | Wt 203.0 lb

## 2014-05-26 DIAGNOSIS — J209 Acute bronchitis, unspecified: Secondary | ICD-10-CM

## 2014-05-26 MED ORDER — HYDROCOD POLST-CHLORPHEN POLST 10-8 MG/5ML PO LQCR: 5.0000 mL | Freq: Two times a day (BID) | ORAL | Status: DC | PRN

## 2014-05-26 MED ORDER — ALBUTEROL SULFATE HFA 108 (90 BASE) MCG/ACT IN AERS: 2.0000 | INHALATION_SPRAY | RESPIRATORY_TRACT | Status: DC | PRN

## 2014-05-26 NOTE — Progress Notes (Signed)
[facility] [address] [place] [address] [phone]  Date:  05/26/2014   Name:  Melinda Peterson   DOB:  June 30, 1951   MRN:  [id number]  PCP:  Ellsworth Lennox, MD    Chief Complaint: Cough   History of Present Illness:  Melinda Peterson is a 63 y.o. very pleasant female patient who presents with the following:  Ill since Sunday with clear nasal drainage and post nasal drainage.  No fever or chills Hoarse, sore throat. Cough that is predominantly non productive. Now wheezing today.  No shortness of breath Improved with meds previously and now ill again. No improvement with over the counter medications or other home remedies.  Denies other complaint or health concern today.   Patient Active Problem List   Diagnosis Date Noted  . Other and unspecified hyperlipidemia 01/13/2014  . Unspecified constipation 06/08/2013  . Personal history of colonic polyps 06/08/2013  . Tobacco user 04/17/2013  . Chronic recurrent sinusitis 01/06/2012  . GERD (gastroesophageal reflux disease) 12/26/2011  . IBS (irritable bowel syndrome) 12/26/2011  . Diverticulosis of colon 12/26/2011  . Degenerative disc disease, cervical 12/26/2011  . Obesity (BMI 30.0-34.9) 12/26/2011  . HTN (hypertension) 11/29/2011  . Gout 11/29/2011  . Mood disorder 11/29/2011    Past Medical History  Diagnosis Date  . Allergy   . Anxiety   . Eczema   . Irritable bowel syndrome   . Osteopenia   . Hyperlipidemia   . Arthritis   . Depression   . Neuromuscular disorder   . Osteoporosis   . Chronic headaches   . Colon polyps   . Diverticulosis   . GERD (gastroesophageal reflux disease)   . HTN (hypertension)     Past Surgical History  Procedure Laterality Date  . Total abdominal hysterectomy    . Acromioclavicularplasty    . Colonoscopy w/ polypectomy  2009, 2012  . Breast surgery Left   . Tubal ligation    . Skin cancer excision      lower back    History  Substance Use Topics   . Smoking status: Former Smoker -- 0.30 packs/day    Types: Cigarettes  . Smokeless tobacco: Never Used  . Alcohol Use: Yes     Comment: social    Family History  Problem Relation Age of Onset  . Colon polyps Father   . Colon polyps Brother   . Heart disease Mother   . Stroke Mother     Allergies  Allergen Reactions  . Biaxin [Clarithromycin]   . Clarithromycin Hives  . Lincomycin Hcl Hives  . Lisinopril Swelling    Medication list has been reviewed and updated.  Current Outpatient Prescriptions on File Prior to Visit  Medication Sig Dispense Refill  . allopurinol (ZYLOPRIM) 300 MG tablet TAKE 1 TABLET BY MOUTH EVERY DAY  30 tablet  3  . ALPRAZolam (XANAX) 0.5 MG tablet TAKE 1 TABLET BY MOUTH 3 TIMES A DAY AS NEEDED FOR ANXIETY  90 tablet  2  . amoxicillin-clavulanate (AUGMENTIN) 875-125 MG per tablet Take 1 tablet by mouth 2 (two) times daily.  20 tablet  0  . aspirin 81 MG tablet Take 81 mg by mouth daily.      Marland Kitchen atorvastatin (LIPITOR) 20 MG tablet TAKE 1 TABLET BY MOUTH AT BEDTIME.  30 tablet  5  . cyclobenzaprine (FLEXERIL) 10 MG tablet TAKE 1 TABLET BY MOUTH TWICE A DAY AS NEEDED FOR MUSCLE SPASMS  60 tablet  3  .  dextromethorphan-guaiFENesin (MUCINEX DM) 30-600 MG per 12 hr tablet Take 1 tablet by mouth every 12 (twelve) hours.      . diclofenac (VOLTAREN) 75 MG EC tablet TAKE 1 TABLET (75 MG TOTAL) BY MOUTH 2 (TWO) TIMES DAILY. TAKE WITH FOOD.  60 tablet  0  . esomeprazole (NEXIUM) 40 MG capsule TAKE ONE CAPSULE BY MOUTH TWICE A DAY  60 capsule  5  . FLUoxetine (PROZAC) 40 MG capsule TAKE ONE CAPSULE BY MOUTH EVERY DAY  30 capsule  5  . hyoscyamine (LEVBID) 0.375 MG 12 hr tablet TAKE 1 TABLET BY MOUTH EVERY 12 HOURS AS NEEDED FOR CRAMPING.  60 tablet  2  . Melatonin 10 MG CAPS Take 10 mg by mouth at bedtime.      . montelukast (SINGULAIR) 10 MG tablet Take 1 tablet (10 mg total) by mouth at bedtime.  30 tablet  5  . Multiple Vitamin (MULTIVITAMIN) capsule Take 1  capsule by mouth daily.      Marland Kitchen OVER THE COUNTER MEDICATION OTC Vitamin D 3 2000 mg daily      . PRESCRIPTION MEDICATION Apply 1 mL topically daily. C-Estradiol HRT compound cream; this is compounded at [facility] ph# [phone].  30 g  5  . promethazine-codeine (PHENERGAN WITH CODEINE) 6.25-10 MG/5ML syrup Take 5-10 mLs by mouth every 6 (six) hours as needed.  120 mL  0  . pseudoephedrine-guaifenesin (MUCINEX D) 60-600 MG per tablet Take 1 tablet by mouth every 12 (twelve) hours.  18 tablet  0  . SUMAtriptan (IMITREX) 100 MG tablet TAKE 1 TABLET BY MOUTH EVERY 2 HOURS AS NEEDED FOR MIGRAINE  10 tablet  4  . triamcinolone (NASACORT AQ) 55 MCG/ACT nasal inhaler Place 2 sprays into the nose daily.  1 Inhaler  12  . triamcinolone cream (KENALOG) 0.1 % Apply 1 application topically 2 (two) times daily.  30 g  0  . valsartan-hydrochlorothiazide (DIOVAN-HCT) 160-25 MG per tablet TAKE 1 TABLET BY MOUTH EVERY DAY TO LOWER BLOOD PRESSURE  30 tablet  5  . zolpidem (AMBIEN) 10 MG tablet TAKE 1 TABLET BY MOUTH AT BEDTIME AS NEEDED SLEEP  30 tablet  1  . ipratropium (ATROVENT HFA) 17 MCG/ACT inhaler Inhale 2 puffs into the lungs every 6 (six) hours.  1 Inhaler  5  . mometasone (NASONEX) 50 MCG/ACT nasal spray Place 2 sprays into the nose daily.       No current facility-administered medications on file prior to visit.    Review of Systems:  As per HPI, otherwise negative.    Physical Examination: Filed Vitals:   05/26/14 1441  BP: 150/78  Pulse: 122  Temp: 99.1 F (37.3 C)  Resp: 18   Filed Vitals:   05/26/14 1441  Height: 5' 3.25" (1.607 m)  Weight: 203 lb (92.08 kg)   Body mass index is 35.66 kg/(m^2). Ideal Body Weight: Weight in (lb) to have BMI = 25: 142  GEN: WDWN, NAD, Non-toxic, A & O x 3 HEENT: Atraumatic, Normocephalic. Neck supple. No masses, No LAD. Ears and Nose: No external deformity. CV: RRR, No M/G/R. No JVD. No thrill. No extra heart sounds. PULM: CTA B, no  wheezes, crackles, rhonchi. No retractions. No resp. distress. No accessory muscle use. ABD: S, NT, ND, +BS. No rebound. No HSM. EXTR: No c/c/e NEURO Normal gait.  PSYCH: Normally interactive. Conversant. Not depressed or anxious appearing.  Calm demeanor.      Assessment and Plan: Viral URI Albuterol MRI tussionex  Continue mucinex  Signed,  Ellison Carwin, MD

## 2014-05-26 NOTE — Patient Instructions (Signed)
Metered Dose Inhaler (No Spacer Used)  Inhaled medicines are the basis of treatment for asthma and other breathing problems. Inhaled medicine can only be effective if used properly. Good technique assures that the medicine reaches the lungs.  Metered dose inhalers (MDIs) are used to deliver a variety of inhaled medicines. These include quick relief or rescue medicines (such as bronchodilators) and controller medicines (such as corticosteroids). The medicine is delivered by pushing down on a metal canister to release a set amount of spray.  If you are using different kinds of inhalers, use your quick relief medicine to open the airways 10-15 minutes before using a steroid, if instructed to do so by your health care provider. If you are unsure which inhalers to use and the order of using them, ask your health care provider, nurse, or respiratory therapist.  HOW TO USE THE INHALER  1. Remove the cap from the inhaler.  2. If you are using the inhaler for the first time, you will need to prime it. Shake the inhaler for 5 seconds and release four puffs into the air, away from your face. Ask your health care provider or pharmacist if you have questions about priming your inhaler.  3. Shake the inhaler for 5 seconds before each breath in (inhalation).  4. Position the inhaler so that the top of the canister faces up.  5. Put your index finger on the top of the medicine canister. Your thumb supports the bottom of the inhaler.  6. Open your mouth.  7. Either place the inhaler between your teeth and place your lips tightly around the mouthpiece, or hold the inhaler 1-2 inches away from your open mouth. If you are unsure of which technique to use, ask your health care provider.  8. Breathe out (exhale) normally and as completely as possible.  9. Press the canister down with the index finger to release the medicine.  10. At the same time as the canister is pressed, inhale deeply and slowly until your lungs are completely filled.  This should take 4-6 seconds. Keep your tongue down.  11. Hold the medicine in your lungs for 5-10 seconds (10 seconds is best). This helps the medicine get into the small airways of your lungs.  12. Breathe out slowly, through pursed lips. Whistling is an example of pursed lips.  13. Wait at least 1 minute between puffs. Continue with the above steps until you have taken the number of puffs your health care provider has ordered. Do not use the inhaler more than your health care provider directs you to.  14. Replace the cap on the inhaler.  15. Follow the directions from your health care provider or the inhaler insert for cleaning the inhaler.  If you are using a steroid inhaler, after your last puff, rinse your mouth with water, gargle, and spit out the water. Do not swallow the water.  AVOID:  · Inhaling before or after starting the spray of medicine. It takes practice to coordinate your breathing with triggering the spray.  · Inhaling through the nose (rather than the mouth) when triggering the spray.  HOW TO DETERMINE IF YOUR INHALER IS FULL OR NEARLY EMPTY  You cannot know when an inhaler is empty by shaking it. Some inhalers are now being made with dose counters. Ask your health care provider for a prescription that has a dose counter if you feel you need that extra help. If your inhaler does not have a counter, ask your health care   provider to help you determine the date you need to refill your inhaler. Write the refill date on a calendar or your inhaler canister. Refill your inhaler 7-10 days before it runs out. Be sure to keep an adequate supply of medicine. This includes making sure it has not expired, and making sure you have a spare inhaler.  SEEK MEDICAL CARE IF:  · Symptoms are only partially relieved with your inhaler.  · You are having trouble using your inhaler.  · You experience an increase in phlegm.  SEEK IMMEDIATE MEDICAL CARE IF:  · You feel little or no relief with your inhalers. You are still  wheezing and feeling shortness of breath, tightness in your chest, or both.  · You have dizziness, headaches, or a fast heart rate.  · You have chills, fever, or night sweats.  · There is a noticeable increase in phlegm production, or there is blood in the phlegm.  MAKE SURE YOU:  · Understand these instructions.  · Will watch your condition.  · Will get help right away if you are not doing well or get worse.  Document Released: 05/27/2007 Document Revised: 12/14/2013 Document Reviewed: 01/15/2013  Exit[employer]® Patient Information ©2015 [employer]. This information is not intended to replace advice given to you by your health care provider. Make sure you discuss any questions you have with your health care provider.

## 2014-06-01 ENCOUNTER — Ambulatory Visit (INDEPENDENT_AMBULATORY_CARE_PROVIDER_SITE_OTHER): Payer: PRIVATE HEALTH INSURANCE | Admitting: Family Medicine

## 2014-06-01 ENCOUNTER — Encounter: Payer: Self-pay | Admitting: Family Medicine

## 2014-06-01 VITALS — BP 150/82 | HR 111 | Temp 98.1°F | Resp 16 | Ht 63.0 in | Wt 202.0 lb

## 2014-06-01 DIAGNOSIS — Z87898 Personal history of other specified conditions: Secondary | ICD-10-CM

## 2014-06-01 DIAGNOSIS — R000 Tachycardia, unspecified: Secondary | ICD-10-CM

## 2014-06-01 DIAGNOSIS — R053 Chronic cough: Secondary | ICD-10-CM

## 2014-06-01 DIAGNOSIS — Z780 Asymptomatic menopausal state: Secondary | ICD-10-CM

## 2014-06-01 DIAGNOSIS — I10 Essential (primary) hypertension: Secondary | ICD-10-CM

## 2014-06-01 DIAGNOSIS — R05 Cough: Secondary | ICD-10-CM

## 2014-06-01 DIAGNOSIS — Z9189 Other specified personal risk factors, not elsewhere classified: Secondary | ICD-10-CM

## 2014-06-01 MED ORDER — AEROCHAMBER PLUS FLO-VU MEDIUM MISC: 1.0000 | Freq: Once | Status: DC

## 2014-06-01 MED ORDER — PROMETHAZINE-CODEINE 6.25-10 MG/5ML PO SYRP: 5.0000 mL | ORAL_SOLUTION | Freq: Four times a day (QID) | ORAL | Status: DC | PRN

## 2014-06-01 MED ORDER — BLOOD PRESSURE MONITOR/WRIST DEVI: 1.0000 | Freq: Every day | Status: DC

## 2014-06-01 NOTE — Patient Instructions (Signed)
Nonspecific Tachycardia Tachycardia is a faster than normal heartbeat (more than 100 beats per minute). In adults, the heart normally beats between 60 and 100 times a minute. A fast heartbeat may be a normal response to exercise or stress. It does not necessarily mean that something is wrong. However, sometimes when your heart beats too fast it may not be able to pump enough blood to the rest of your body. This can result in chest pain, shortness of breath, dizziness, and even fainting. Nonspecific tachycardia means that the specific cause or pattern of your tachycardia is unknown. CAUSES  Tachycardia may be harmless or it may be due to a more serious underlying cause. Possible causes of tachycardia include:  Exercise or exertion.  Fever.  Pain or injury.  Infection.  Loss of body fluids (dehydration).  Overactive thyroid.  Lack of red blood cells (anemia).  Anxiety and stress.  Alcohol.  Caffeine.  Tobacco products.  Diet pills.  Illegal drugs.  Heart disease. SYMPTOMS  Rapid or irregular heartbeat (palpitations).  Suddenly feeling your heart beating (cardiac awareness).  Dizziness.  Tiredness (fatigue).  Shortness of breath.  Chest pain.  Nausea.  Fainting. DIAGNOSIS  Your caregiver will perform a physical exam and take your medical history. In some cases, a heart specialist (cardiologist) may be consulted. Your caregiver may also order:  Blood tests.  Electrocardiography. This test records the electrical activity of your heart.  A heart monitoring test. TREATMENT  Treatment will depend on the likely cause of your tachycardia. The goal is to treat the underlying cause of your tachycardia. Treatment methods may include:  Replacement of fluids or blood through an intravenous (IV) tube for moderate to severe dehydration or anemia.  New medicines or changes in your current medicines.  Diet and lifestyle changes.  Treatment for certain  infections.  Stress relief or relaxation methods. HOME CARE INSTRUCTIONS   Rest.  Drink enough fluids to keep your urine clear or pale yellow.  Do not smoke.  Avoid:  Caffeine.  Tobacco.  Alcohol.  Chocolate.  Stimulants such as over-the-counter diet pills or pills that help you stay awake.  Situations that cause anxiety or stress.  Illegal drugs such as marijuana, phencyclidine (PCP), and cocaine.  Only take medicine as directed by your caregiver.  Keep all follow-up appointments as directed by your caregiver. SEEK IMMEDIATE MEDICAL CARE IF:   You have pain in your chest, upper arms, jaw, or neck.  You become weak, dizzy, or feel faint.  You have palpitations that will not go away.  You vomit, have diarrhea, or pass blood in your stool.  Your skin is cool, pale, and wet.  You have a fever that will not go away with rest, fluids, and medicine. MAKE SURE YOU:   Understand these instructions.  Will watch your condition.  Will get help right away if you are not doing well or get worse. Document Released: 09/06/2004 Document Revised: 10/22/2011 Document Reviewed: 07/10/2011 [facility] Patient Information 2015 [place], [place]. This information is not intended to replace advice given to you by your health care provider. Make sure you discuss any questions you have with your health care provider.        Mediterranean Diet  Why follow it? Research shows.   Those who follow the Mediterranean diet have a reduced risk of heart disease    The diet is associated with a reduced incidence of Parkinson's and Alzheimer's diseases   People following the diet may have longer life expectancies and  lower rates of chronic diseases    The Dietary Guidelines for Americans recommends the Mediterranean diet as an eating plan to promote health and prevent disease  What Is the Mediterranean Diet?    Healthy eating plan based on typical foods and recipes of Mediterranean-style  cooking   The diet is primarily a plant based diet; these foods should make up a majority of meals   Starches - Plant based foods should make up a majority of meals - They are an important sources of vitamins, minerals, energy, antioxidants, and fiber - Choose whole grains, foods high in fiber and minimally processed items  - Typical grain sources include wheat, oats, barley, corn, brown rice, bulgar, farro, millet, polenta, couscous  - Various types of beans include chickpeas, lentils, fava beans, black beans, white beans   Fruits  Veggies - Large quantities of antioxidant rich fruits & veggies; 6 or more servings  - Vegetables can be eaten raw or lightly drizzled with oil and cooked  - Vegetables common to the traditional Mediterranean Diet include: artichokes, arugula, beets, broccoli, brussel sprouts, cabbage, carrots, celery, collard greens, cucumbers, eggplant, kale, leeks, lemons, lettuce, mushrooms, okra, onions, peas, peppers, potatoes, pumpkin, radishes, rutabaga, shallots, spinach, sweet potatoes, turnips, zucchini - Fruits common to the Mediterranean Diet include: apples, apricots, avocados, cherries, clementines, dates, figs, grapefruits, grapes, melons, nectarines, oranges, peaches, pears, pomegranates, strawberries, tangerines  Fats - Replace butter and margarine with healthy oils, such as olive oil, canola oil, and tahini  - Limit nuts to no more than a handful a day  - Nuts include walnuts, almonds, pecans, pistachios, pine nuts  - Limit or avoid candied, honey roasted or heavily salted nuts - Olives are central to the [employer] - can be eaten whole or used in a variety of dishes   Meats Protein - Limiting red meat: no more than a few times a month - When eating red meat: choose lean cuts and keep the portion to the size of deck of cards - Eggs: approx. 0 to 4 times a week  - Fish and lean poultry: at least 2 a week  - Healthy protein sources include, chicken, [place],  lean beef, lamb - Increase intake of seafood such as tuna, salmon, trout, mackerel, shrimp, scallops - Avoid or limit high fat processed meats such as sausage and bacon  Dairy - Include moderate amounts of low fat dairy products  - Focus on healthy dairy such as fat free yogurt, skim milk, low or reduced fat cheese - Limit dairy products higher in fat such as whole or 2% milk, cheese, ice cream  Alcohol - Moderate amounts of red wine is ok  - No more than 5 oz daily for women (all ages) and men older than age 44  - No more than 10 oz of wine daily for men younger than 48  Other - Limit sweets and other desserts  - Use herbs and spices instead of salt to flavor foods  - Herbs and spices common to the traditional Mediterranean Diet include: basil, bay leaves, chives, cloves, cumin, fennel, garlic, lavender, marjoram, mint, oregano, parsley, pepper, rosemary, sage, savory, sumac, tarragon, thyme   It's not just a diet, it's a lifestyle:    The Mediterranean diet includes lifestyle factors typical of those in the region    Foods, drinks and meals are best eaten with others and savored   Daily physical activity is important for overall good health   This could be strenuous exercise  like running and aerobics   This could also be more leisurely activities such as walking, housework, yard-work, or taking the stairs   Moderation is the key; a balanced and healthy diet accommodates most foods and drinks   Consider portion sizes and frequency of consumption of certain foods   Meal Ideas & Options:    Breakfast:  o Whole wheat toast or whole wheat English muffins with peanut butter & hard boiled egg o Steel cut oats topped with apples & cinnamon and skim milk  o Fresh fruit: banana, strawberries, melon, berries, peaches  o Smoothies: strawberries, bananas, greek yogurt, peanut butter o Low fat greek yogurt with blueberries and granola  o Egg white omelet with spinach and mushrooms o Breakfast  couscous: whole wheat couscous, apricots, skim milk, cranberries    Sandwiches:  o Hummus and grilled vegetables (peppers, zucchini, squash) on whole wheat bread   o Grilled chicken on whole wheat pita with lettuce, tomatoes, cucumbers or tzatziki  o Tuna salad on whole wheat bread: tuna salad made with greek yogurt, olives, red peppers, capers, green onions o Garlic rosemary lamb pita: lamb sauted with garlic, rosemary, salt & pepper; add lettuce, cucumber, greek yogurt to pita - flavor with lemon juice and black pepper    Seafood:  o Mediterranean grilled salmon, seasoned with garlic, basil, parsley, lemon juice and black pepper o Shrimp, lemon, and spinach whole-grain pasta salad made with low fat greek yogurt  o Seared scallops with lemon orzo  o Seared tuna steaks seasoned salt, pepper, coriander topped with tomato mixture of olives, tomatoes, olive oil, minced garlic, parsley, green onions and cappers    Meats:  o Herbed greek chicken salad with kalamata olives, cucumber, feta  o Red bell peppers stuffed with spinach, bulgur, lean ground beef (or lentils) & topped with feta   o Kebabs: skewers of chicken, tomatoes, onions, zucchini, squash  o [place] burgers: made with red onions, mint, dill, lemon juice, feta cheese topped with roasted red peppers   Vegetarian o Cucumber salad: cucumbers, artichoke hearts, celery, red onion, feta cheese, tossed in olive oil & lemon juice  o Hummus and whole grain pita points with a greek salad (lettuce, tomato, feta, olives, cucumbers, red onion) o Lentil soup with celery, carrots made with vegetable broth, garlic, salt and pepper  o Tabouli salad: parsley, bulgur, mint, scallions, cucumbers, tomato, radishes, lemon juice, olive oil, salt and pepper. o

## 2014-06-02 ENCOUNTER — Telehealth: Payer: Self-pay

## 2014-06-02 NOTE — Progress Notes (Signed)
Subjective:    Patient ID: Melinda Peterson, female    DOB: Oct 02, 1950, 63 y.o.   MRN: [id number]  Hypertension Pertinent negatives include no shortness of breath.    This 63 y.o. Cauc female returns for follow-up re: cough, treated at [facility] on 05/13/2014 and 05/26/2014. Pt has hx of chronic recurrent sinusitis w/ bronchitis or bronchospasms. Latter visit >> diagnosis "Viral URI". Pt is better w/ less cough and no wheezes; she requests refill of cough syrup (promethazine- codeine). Uses MDIs but not sure if she is doing this correctly. Has been advised in the past that a spacer may help. Pt does have GERD.  HTN- Stable on current medication; not monitoring BP at home but will try to do better. Needs new monitoring device. Just returned from visit w/ family in [place]. Thinks she has "white coat HTN". States compliance with medication but nutrition needs improvement for weight reduction.  Tachycardia- Heart rate consistently elevated at visits. Pt maintains good hydration, always drinking bottled water at visits. She attributes increased HR to anxiety. There are several chronic family issues w/ stressful dynamics but pt reports she is handling these relationships in more productive way. (Thyroid labs checked Sept 2014 were normal; ECG in June 2015 was normal w/ HR= 64).  Pt requests mammogram referral due to new insurance through husband's employer; she had abnormal mammogram w/ benign breast mass. Pt also requesting bone density study.  She is stable w/ regard to other medical conditions and is compliant w/ all medications.  Patient Active Problem List   Diagnosis Date Noted  . Other and unspecified hyperlipidemia 01/13/2014  . Unspecified constipation 06/08/2013  . Personal history of colonic polyps 06/08/2013  . Tobacco user 04/17/2013  . Chronic recurrent sinusitis 01/06/2012  . GERD (gastroesophageal reflux disease) 12/26/2011  . IBS (irritable bowel syndrome) 12/26/2011  .  Diverticulosis of colon 12/26/2011  . Degenerative disc disease, cervical 12/26/2011  . Obesity (BMI 30.0-34.9) 12/26/2011  . HTN (hypertension) 11/29/2011  . Gout 11/29/2011  . Mood disorder 11/29/2011    Prior to Admission medications   Medication Sig Start Date End Date Taking? Authorizing Provider  albuterol (PROVENTIL HFA;VENTOLIN HFA) 108 (90 BASE) MCG/ACT inhaler Inhale 2 puffs into the lungs every 4 (four) hours as needed for wheezing or shortness of breath (cough, shortness of breath or wheezing.). 05/26/14  Yes Roselee Culver, MD  allopurinol (ZYLOPRIM) 300 MG tablet TAKE 1 TABLET BY MOUTH EVERY DAY 04/29/14  Yes Mancel Bale, PA-C  ALPRAZolam [name]) 0.5 MG tablet TAKE 1 TABLET BY MOUTH 3 TIMES A DAY AS NEEDED FOR ANXIETY 04/15/14  Yes Barton Fanny, MD  aspirin 81 MG tablet Take 81 mg by mouth daily.   Yes Historical Provider, MD  atorvastatin (LIPITOR) 20 MG tablet TAKE 1 TABLET BY MOUTH AT BEDTIME. 01/13/14  Yes Barton Fanny, MD  cyclobenzaprine (FLEXERIL) 10 MG tablet TAKE 1 TABLET BY MOUTH TWICE A DAY AS NEEDED FOR MUSCLE SPASMS 02/10/14  Yes Barton Fanny, MD  dextromethorphan-guaiFENesin [facility]) 30-600 MG per 12 hr tablet Take 1 tablet by mouth every 12 (twelve) hours.   Yes Historical Provider, MD  diclofenac (VOLTAREN) 75 MG EC tablet TAKE 1 TABLET (75 MG TOTAL) BY MOUTH 2 (TWO) TIMES DAILY. TAKE WITH FOOD. 12/28/13  Yes Barton Fanny, MD  esomeprazole (NEXIUM) 40 MG capsule TAKE ONE CAPSULE BY MOUTH TWICE A DAY 03/03/14  Yes Barton Fanny, MD  FLUoxetine (PROZAC) 40 MG capsule TAKE ONE  CAPSULE BY MOUTH EVERY DAY 01/13/14  Yes Barton Fanny, MD  hyoscyamine (LEVBID) 0.375 MG 12 hr tablet TAKE 1 TABLET BY MOUTH EVERY 12 HOURS AS NEEDED FOR CRAMPING. 05/12/14  Yes Barton Fanny, MD  ipratropium (ATROVENT HFA) 17 MCG/ACT inhaler Inhale 2 puffs into the lungs every 6 (six) hours. 04/30/13  Yes Barton Fanny, MD  Melatonin 10 MG CAPS  Take 10 mg by mouth at bedtime.   Yes Historical Provider, MD  Multiple Vitamin (MULTIVITAMIN) capsule Take 1 capsule by mouth daily.   Yes Historical Provider, MD  OVER THE COUNTER MEDICATION OTC Vitamin D 3 2000 mg daily   Yes Historical Provider, MD  PRESCRIPTION MEDICATION Apply 1 mL topically daily. C-Estradiol HRT compound cream; this is compounded at [facility] ph# [phone].   Yes Barton Fanny, MD  SUMAtriptan (IMITREX) 100 MG tablet TAKE 1 TABLET BY MOUTH EVERY 2 HOURS AS NEEDED FOR MIGRAINE 05/24/13  Yes Barton Fanny, MD  triamcinolone (NASACORT AQ) 55 MCG/ACT nasal inhaler Place 2 sprays into the nose daily. 05/09/13  Yes Roselee Culver, MD  triamcinolone cream (KENALOG) 0.1 % Apply 1 application topically 2 (two) times daily. 05/13/14  Yes Roselee Culver, MD  valsartan-hydrochlorothiazide (DIOVAN-HCT) 160-25 MG per tablet TAKE 1 TABLET BY MOUTH EVERY DAY TO LOWER BLOOD PRESSURE 01/13/14  Yes Barton Fanny, MD  zolpidem (AMBIEN) 10 MG tablet TAKE 1 TABLET BY MOUTH AT BEDTIME AS NEEDED SLEEP 05/20/14  Yes Barton Fanny, MD  Blood Pressure Monitoring (BLOOD PRESSURE MONITOR/WRIST) DEVI 1 Device by Does not apply route daily. 06/01/14   Barton Fanny, MD  chlorpheniramine-HYDROcodone [facility]) 10-8 MG/5ML LQCR Take 5 mLs by mouth every 12 (twelve) hours as needed. 05/26/14   Roselee Culver, MD  mometasone (NASONEX) 50 MCG/ACT nasal spray Place 2 sprays into the nose daily.    Historical Provider, MD  montelukast (SINGULAIR) 10 MG tablet Take 1 tablet (10 mg total) by mouth at bedtime. 10/10/12   Barton Fanny, MD  promethazine-codeine [facility] WITH CODEINE) 6.25-10 MG/5ML syrup Take 5-10 mLs by mouth every 6 (six) hours as needed. 06/01/14   Barton Fanny, MD  pseudoephedrine-guaifenesin [facility] D) 60-600 MG per tablet Take 1 tablet by mouth every 12 (twelve) hours. 05/13/14 05/13/15  Roselee Culver, MD    Spacer/Aero-Holding Chambers (AEROCHAMBER PLUS FLO-VU MEDIUM) MISC 1 each by Other route once. 06/01/14   Barton Fanny, MD    Review of Systems  Constitutional: Negative.   HENT: Positive for congestion, postnasal drip and sinus pressure. Negative for sore throat and trouble swallowing.   Respiratory: Positive for cough. Negative for chest tightness, shortness of breath and wheezing.   Cardiovascular: Negative.   Endocrine: Negative.   Neurological: Negative.   Psychiatric/Behavioral: The patient is nervous/anxious.          Objective:   Physical Exam  Nursing note and vitals reviewed. Constitutional: She is oriented to person, place, and time. She appears well-developed and well-nourished. No distress.  HENT:  Head: Normocephalic and atraumatic.  Right Ear: Hearing, tympanic membrane, external ear and ear canal normal.  Left Ear: Hearing, tympanic membrane, external ear and ear canal normal.  Nose: No mucosal edema, nasal deformity or septal deviation. Right sinus exhibits maxillary sinus tenderness. Right sinus exhibits no frontal sinus tenderness. Left sinus exhibits no maxillary sinus tenderness and no frontal sinus tenderness.  Mouth/Throat: Uvula is midline and mucous membranes are normal. No  oral lesions. Normal dentition. Posterior oropharyngeal erythema present. No oropharyngeal exudate or posterior oropharyngeal edema.  Neck: Normal range of motion. Neck supple. No thyromegaly present.  Cardiovascular: Normal rate, regular rhythm and normal heart sounds.   Pulmonary/Chest: Effort normal and breath sounds normal. No respiratory distress. She has no wheezes.  Musculoskeletal: Normal range of motion.  Lymphadenopathy:    She has no cervical adenopathy.  Neurological: She is alert and oriented to person, place, and time. No cranial nerve deficit. Coordination normal.  Skin: Skin is warm and dry. She is not diaphoretic. No erythema.       Assessment & Plan:  Cough,  persistent- Continue treating allergy symptoms; refill x 1 cough syrup. I observed pt using rescue MDI; instructions given to improve efficacy of inhaled medication. Will prescribe a spacer.  Essential hypertension- Encouraged healthy nutrition and increased physical activity to promote weight reduction. Needs new BP cuff.  Tachycardia- Monitor and address anxiety; continue current medications.  History of abnormal mammogram - Plan: [facility] Digital Diagnostic Bilat  Postmenopausal estrogen deficiency - Plan: DG Bone Density   Meds ordered this encounter  Medications  . promethazine-codeine (PHENERGAN WITH CODEINE) 6.25-10 MG/5ML syrup    Sig: Take 5-10 mLs by mouth every 6 (six) hours as needed.    Dispense:  120 mL    Refill:  0  . Blood Pressure Monitoring (BLOOD PRESSURE MONITOR/WRIST) DEVI    Sig: 1 Device by Does not apply route daily.    Dispense:  1 Device    Refill:  0  . Spacer/Aero-Holding Chambers (AEROCHAMBER PLUS FLO-VU MEDIUM) MISC    Sig: 1 each by Other route once.    Dispense:  1 each    Refill:  0

## 2014-06-04 ENCOUNTER — Encounter (INDEPENDENT_AMBULATORY_CARE_PROVIDER_SITE_OTHER): Payer: PRIVATE HEALTH INSURANCE | Admitting: Ophthalmology

## 2014-06-04 ENCOUNTER — Ambulatory Visit (HOSPITAL_COMMUNITY): Payer: PRIVATE HEALTH INSURANCE

## 2014-06-11 ENCOUNTER — Encounter (INDEPENDENT_AMBULATORY_CARE_PROVIDER_SITE_OTHER): Payer: PRIVATE HEALTH INSURANCE | Admitting: Ophthalmology

## 2014-06-16 ENCOUNTER — Other Ambulatory Visit: Payer: Self-pay | Admitting: Family Medicine

## 2014-06-17 NOTE — Telephone Encounter (Signed)
Zolpidem was refilled on 05/20/2014 with 1 additional refill.

## 2014-06-22 ENCOUNTER — Other Ambulatory Visit: Payer: Self-pay | Admitting: Family Medicine

## 2014-06-29 ENCOUNTER — Other Ambulatory Visit: Payer: Self-pay | Admitting: Family Medicine

## 2014-07-02 ENCOUNTER — Other Ambulatory Visit: Payer: Self-pay | Admitting: Family Medicine

## 2014-07-15 ENCOUNTER — Other Ambulatory Visit: Payer: Self-pay | Admitting: Family Medicine

## 2014-07-18 NOTE — Telephone Encounter (Signed)
Alprazolam and Zolpidem refills phoned to pt's pharmacy.

## 2014-07-27 ENCOUNTER — Other Ambulatory Visit: Payer: Self-pay | Admitting: Family Medicine

## 2014-08-13 HISTORY — PX: CATARACT EXTRACTION, BILATERAL: SHX1313

## 2014-08-28 ENCOUNTER — Other Ambulatory Visit: Payer: Self-pay | Admitting: Family Medicine

## 2014-08-31 ENCOUNTER — Ambulatory Visit: Payer: PRIVATE HEALTH INSURANCE | Admitting: Family Medicine

## 2014-09-08 ENCOUNTER — Other Ambulatory Visit: Payer: Self-pay | Admitting: Emergency Medicine

## 2014-09-08 ENCOUNTER — Ambulatory Visit: Payer: PRIVATE HEALTH INSURANCE | Admitting: Family Medicine

## 2014-09-08 ENCOUNTER — Other Ambulatory Visit: Payer: Self-pay | Admitting: Family Medicine

## 2014-09-08 ENCOUNTER — Other Ambulatory Visit: Payer: Self-pay | Admitting: Physician Assistant

## 2014-09-10 ENCOUNTER — Ambulatory Visit (INDEPENDENT_AMBULATORY_CARE_PROVIDER_SITE_OTHER): Payer: PRIVATE HEALTH INSURANCE | Admitting: Family Medicine

## 2014-09-10 ENCOUNTER — Encounter: Payer: Self-pay | Admitting: Family Medicine

## 2014-09-10 VITALS — BP 146/84 | HR 108 | Temp 98.4°F | Resp 18 | Ht 62.5 in | Wt 196.8 lb

## 2014-09-10 DIAGNOSIS — R000 Tachycardia, unspecified: Secondary | ICD-10-CM

## 2014-09-10 DIAGNOSIS — I10 Essential (primary) hypertension: Secondary | ICD-10-CM | POA: Diagnosis not present

## 2014-09-10 DIAGNOSIS — Z23 Encounter for immunization: Secondary | ICD-10-CM

## 2014-09-10 DIAGNOSIS — H269 Unspecified cataract: Secondary | ICD-10-CM | POA: Insufficient documentation

## 2014-09-10 DIAGNOSIS — E669 Obesity, unspecified: Secondary | ICD-10-CM

## 2014-09-10 DIAGNOSIS — Z1231 Encounter for screening mammogram for malignant neoplasm of breast: Secondary | ICD-10-CM

## 2014-09-10 DIAGNOSIS — F39 Unspecified mood [affective] disorder: Secondary | ICD-10-CM

## 2014-09-10 DIAGNOSIS — N952 Postmenopausal atrophic vaginitis: Secondary | ICD-10-CM | POA: Diagnosis not present

## 2014-09-10 MED ORDER — BLOOD PRESSURE MONITOR/WRIST DEVI: 1.0000 | Freq: Every day | Status: DC

## 2014-09-10 MED ORDER — DICLOFENAC SODIUM 75 MG PO TBEC: DELAYED_RELEASE_TABLET | ORAL | Status: DC

## 2014-09-10 MED ORDER — HYDROCOD POLST-CHLORPHEN POLST 10-8 MG/5ML PO LQCR: 5.0000 mL | Freq: Two times a day (BID) | ORAL | Status: DC | PRN

## 2014-09-10 MED ORDER — ZOSTER VACCINE LIVE 19400 UNT/0.65ML ~~LOC~~ SOLR: 0.6500 mL | Freq: Once | SUBCUTANEOUS | Status: DC

## 2014-09-10 NOTE — Progress Notes (Signed)
Subjective:    Patient ID: Melinda Peterson, female    DOB: 1950/11/17, 64 y.o.   MRN: [id number]  HPI This 64 y.o. Female presents for follow-up w/ multiple issues to discuss; efforts are made to focus discussion of her needs today.   HTN- Pt is compliant w/ medications w/o adverse effects. She has a chronic cough unrelated to valsartan- HCT. Not monitoring BP at home, though BP device was prescribed at last visit. Pt and husband have adopted healthier lifestyle w/ better nutrition and daily exercise, weather permitting. She has lost a few lbs. Weight gain in the past secondary to emotional eating, chronic anxiety and sedentary lifestyle. Pt reports she weighs less on home scale. Denies diaphoresis, fatigue, CP or tightness, SOB or DOE, HA, dizziness, numbness or syncope.  Tachycardia- This is persistent; pt attributes this to her mild anxiety and inability to relax. Pt takes fluoxetine which can increase nervousness; alprazolam is used 3x daily prn anxiety. She is very talkative and fidgets a lot. Dry mouth is a persistent complaint, which she attributes to medications. She denies significant caffeine consumption. She denies palpitations. No known hx of thyroid or adrenal condition. She does use albuterol and ipratropium MDIs for pulmonary symptoms.  Pt has atrophic vaginitis, treated in the past w/ C Estradiol HRT compound cream; symptoms persist. Pt requests refill but has not had recent mammogram. Imaging study was ordered but past hx of abnormal imaging in [place] has delayed getting study completed here. It was previously ordered as "diagnostic" at [facility]; pt reports that she has benign simple cysts on previous studies. (We will try to get a simple screening mammogram ordered).  Mood disorder- Pt has chronic anxiety w/ depression and reports compliance w/ medications. Family issues have partially resolved and pt feels less stressed; she spent a significant amount of time sharing pictures of  grandchildren and wanting to discuss the events in their lives (I had to redirect the pt repeatedly).   Patient Active Problem List   Diagnosis Date Noted  . Cataract of both eyes 09/10/2014  . Other and unspecified hyperlipidemia 01/13/2014  . Unspecified constipation 06/08/2013  . Personal history of colonic polyps 06/08/2013  . Tobacco user 04/17/2013  . Chronic recurrent sinusitis 01/06/2012  . GERD (gastroesophageal reflux disease) 12/26/2011  . IBS (irritable bowel syndrome) 12/26/2011  . Diverticulosis of colon 12/26/2011  . Degenerative disc disease, cervical 12/26/2011  . Obesity (BMI 30.0-34.9) 12/26/2011  . HTN (hypertension) 11/29/2011  . Gout 11/29/2011  . Mood disorder 11/29/2011   Prior to Admission medications   Medication Sig Start Date End Date Taking? Authorizing Provider  albuterol (PROVENTIL HFA;VENTOLIN HFA) 108 (90 BASE) MCG/ACT inhaler Inhale 2 puffs into the lungs every 4 (four) hours as needed for wheezing or shortness of breath (cough, shortness of breath or wheezing.). 05/26/14  Yes Roselee Culver, MD  allopurinol (ZYLOPRIM) 300 MG tablet TAKE 1 TABLET BY MOUTH EVERY DAY.  "OV NEEDED FOR ADDITIONAL REFILLS" 09/08/14  Yes Chelle Janalee Dane, PA-C  ALPRAZolam (XANAX) 0.5 MG tablet TAKE 1 TABLET 3 TIMES A DAY AS NEEDED FOR ANXIETY 07/18/14  Yes Barton Fanny, MD  aspirin 81 MG tablet Take 81 mg by mouth daily.   Yes Historical Provider, MD  atorvastatin (LIPITOR) 20 MG tablet TAKE 1 TABLET BY MOUTH AT BEDTIME. "OV NEEDED FOR ADDITIONAL REFILLS" 09/08/14  Yes Chelle S Jeffery, PA-C  Blood Pressure Monitoring (BLOOD PRESSURE MONITOR/WRIST) DEVI 1 Device by Does not apply route daily.  Yes Barton Fanny, MD  chlorpheniramine-HYDROcodone [facility] PENNKINETIC ER) 10-8 MG/5ML LQCR Take 5 mLs by mouth every 12 (twelve) hours as needed.    Barton Fanny, MD  cyclobenzaprine (FLEXERIL) 10 MG tablet TAKE 1 TABLET BY MOUTH TWICE A DAY AS NEEDED FOR  MUSCLE SPASMS 07/29/14  Yes Barton Fanny, MD  diclofenac (VOLTAREN) 75 MG EC tablet TAKE 1 TABLET (75 MG TOTAL) BY MOUTH 2 (TWO) TIMES DAILY. TAKE WITH FOOD. 09/10/14  Yes Barton Fanny, MD  esomeprazole (NEXIUM) 40 MG capsule TAKE ONE CAPSULE BY MOUTH TWICE A DAY 08/30/14  Yes Barton Fanny, MD  FLUoxetine (PROZAC) 40 MG capsule TAKE ONE CAPSULE BY MOUTH EVERY DAY 07/29/14  Yes Barton Fanny, MD  hyoscyamine (LEVBID) 0.375 MG 12 hr tablet TAKE 1 TABLET BY MOUTH EVERY 12 HOURS AS NEEDED FOR CRAMPING. 07/29/14  Yes Barton Fanny, MD  ipratropium (ATROVENT HFA) 17 MCG/ACT inhaler Inhale 2 puffs into the lungs every 6 (six) hours. 04/30/13  Yes Barton Fanny, MD  Melatonin 10 MG CAPS Take 10 mg by mouth at bedtime.   Yes Historical Provider, MD  montelukast (SINGULAIR) 10 MG tablet Take 1 tablet (10 mg total) by mouth at bedtime. 10/10/12  Yes Barton Fanny, MD  Multiple Vitamin (MULTIVITAMIN) capsule Take 1 capsule by mouth daily.   Yes Historical Provider, MD  OVER THE COUNTER MEDICATION OTC Vitamin D 3 2000 mg daily   Yes Historical Provider, MD  PRESCRIPTION MEDICATION Apply 1 mL topically daily. C-Estradiol HRT compound cream; this is compounded at [facility] ph# [phone].   Yes Barton Fanny, MD  Spacer/Aero-Holding Chambers (AEROCHAMBER PLUS FLO-VU MEDIUM) MISC 1 each by Other route once. 06/01/14  Yes Barton Fanny, MD  SUMAtriptan (IMITREX) 100 MG tablet TAKE 1 TABLET BY MOUTH EVERY 2 HOURS AS NEEDED FOR MIGRAINE 06/29/14  Yes Mancel Bale, PA-C  triamcinolone (NASACORT) 55 MCG/ACT AERO nasal inhaler PLACE 2 SPRAYS INTO THE NOSE DAILY.  "OV NEEDED FOR ADDITIONAL REFILLS" 09/08/14  Yes Chelle S Jeffery, PA-C  triamcinolone cream (KENALOG) 0.1 % Apply 1 application topically 2 (two) times daily. 05/13/14  Yes Roselee Culver, MD  valsartan-hydrochlorothiazide (DIOVAN-HCT) 160-25 MG per tablet TAKE 1 TABLET BY MOUTH EVERY DAY TO  LOWER BLOOD PRESSURE 07/29/14  Yes Barton Fanny, MD  zolpidem (AMBIEN) 10 MG tablet TAKE 1 TABLET BY MOUTH AT BEDTIME AS NEEDED SLEEP 07/18/14  Yes Barton Fanny, MD           PMHx, SURG Hx, SOC and FAM Hx reviewed.   Review of Systems  As per HPI. Recently diagnosed w/ "Dry eye".     Objective:   Physical Exam  Constitutional: She is oriented to person, place, and time. She appears well-developed and well-nourished. No distress.  Blood pressure 146/84, pulse 108, temperature 98.4 F (36.9 C), temperature source Oral, resp. rate 18, height 5' 2.5" (1.588 m), weight 196 lb 12.8 oz (89.268 kg), SpO2 99 %.   HENT:  Head: Normocephalic and atraumatic.  Right Ear: External ear normal.  Left Ear: External ear normal.  Nose: Nose normal.  Oral mucosa id dry.  Eyes: Conjunctivae and EOM are normal. Pupils are equal, round, and reactive to light. No scleral icterus.  Neck: Normal range of motion. Neck supple. No thyromegaly present.  Cardiovascular: Regular rhythm, S1 normal, S2 normal, normal heart sounds and normal pulses.  Tachycardia present.  PMI is not displaced.  Exam reveals  no gallop.   No murmur heard. Pulmonary/Chest: Effort normal and breath sounds normal. No respiratory distress. She has no wheezes. She has no rhonchi.  Musculoskeletal: Normal range of motion. She exhibits no edema.  Neurological: She is alert and oriented to person, place, and time. She has normal strength. She displays no tremor. No cranial nerve deficit or sensory deficit. She exhibits normal muscle tone. Coordination and gait normal.  Skin: Skin is warm, dry and intact. She is not diaphoretic. No pallor.  Psychiatric: Her speech is normal. Judgment and thought content normal. Her mood appears not anxious. Her affect is not inappropriate. She is not agitated. Cognition and memory are normal.  Speech occasional tangential. Somewhat inattentive and fidgety.  Nursing note and vitals reviewed.       Assessment & Plan:  Essential hypertension- Continue current medication; reprint RX: Blood pressure device for daily monitoring.  Tachycardia- Heart rate > 100 for more than 2 years. Monitor at home. Suspect that pt may have mild symptoms of excess serotonin (dry mouth and increased heart rate) due to drug interactions.   Obesity (BMI 30.0-34.9)- Encourage implementation of other TLCs that promote better nutrition and active lifestyle.  Postmenopausal atrophic vaginitis - Plan: Ambulatory referral to Gynecology  Mood disorder- Continue fluoxetine 40 mg and prn alprazolam (consider reduction to no more than 2x daily).  Need for shingles vaccine - Plan: zoster vaccine live, PF, (ZOSTAVAX) 92924 UNT/0.65ML injection  Visit for screening mammogram - Plan: MM Digital Screening   Meds ordered this encounter  Medications  . diclofenac (VOLTAREN) 75 MG EC tablet    Sig: TAKE 1 TABLET (75 MG TOTAL) BY MOUTH 2 (TWO) TIMES DAILY. TAKE WITH FOOD.    Dispense:  60 tablet    Refill:  3  . zoster vaccine live, PF, (ZOSTAVAX) 46286 UNT/0.65ML injection    Sig: Inject 19,400 Units into the skin once.    Dispense:  1 each    Refill:  0  . chlorpheniramine-HYDROcodone (TUSSIONEX PENNKINETIC ER) 10-8 MG/5ML LQCR    Sig: Take 5 mLs by mouth every 12 (twelve) hours as needed.    Dispense:  60 mL    Refill:  0  . Blood Pressure Monitoring (BLOOD PRESSURE MONITOR/WRIST) DEVI    Sig: 1 Device by Does not apply route daily.    Dispense:  1 Device    Refill:  0

## 2014-09-13 ENCOUNTER — Telehealth: Payer: Self-pay | Admitting: Family Medicine

## 2014-09-13 NOTE — Telephone Encounter (Signed)
LMOM to call back

## 2014-09-13 NOTE — Telephone Encounter (Signed)
-----   Message from Barton Fanny, MD sent at 09/12/2014  5:50 PM EST ----- Please contact pt and ask her to schedule a visit within next 3-4 weeks.   In reviewing her chart, there are some labs and medications changes that we need to discuss prior to her appt in May.  Thank you.

## 2014-09-20 ENCOUNTER — Other Ambulatory Visit: Payer: Self-pay | Admitting: Family Medicine

## 2014-09-21 NOTE — Telephone Encounter (Signed)
Spoke with patient she will call back to schedule an appt with dr Leward Quan to discuss medicine and do some blood work

## 2014-09-30 ENCOUNTER — Other Ambulatory Visit: Payer: Self-pay | Admitting: Family Medicine

## 2014-10-13 ENCOUNTER — Other Ambulatory Visit: Payer: Self-pay | Admitting: Physician Assistant

## 2014-10-13 ENCOUNTER — Other Ambulatory Visit: Payer: Self-pay | Admitting: Family Medicine

## 2014-10-21 ENCOUNTER — Other Ambulatory Visit: Payer: Self-pay | Admitting: Family Medicine

## 2014-11-17 ENCOUNTER — Telehealth: Payer: Self-pay | Admitting: *Deleted

## 2014-11-17 DIAGNOSIS — H25012 Cortical age-related cataract, left eye: Secondary | ICD-10-CM | POA: Diagnosis not present

## 2014-11-17 DIAGNOSIS — H35033 Hypertensive retinopathy, bilateral: Secondary | ICD-10-CM | POA: Diagnosis not present

## 2014-11-17 DIAGNOSIS — H2512 Age-related nuclear cataract, left eye: Secondary | ICD-10-CM | POA: Diagnosis not present

## 2014-11-17 DIAGNOSIS — H5703 Miosis: Secondary | ICD-10-CM | POA: Diagnosis not present

## 2014-11-17 MED ORDER — ATORVASTATIN CALCIUM 20 MG PO TABS
ORAL_TABLET | ORAL | Status: DC
Start: 2014-11-17 — End: 2015-01-16

## 2014-11-17 MED ORDER — HYOSCYAMINE SULFATE ER 0.375 MG PO TB12: ORAL_TABLET | ORAL | Status: DC

## 2014-11-17 MED ORDER — DICLOFENAC SODIUM 75 MG PO TBEC
DELAYED_RELEASE_TABLET | ORAL | Status: DC
Start: 2014-11-17 — End: 2015-04-14

## 2014-11-17 MED ORDER — FLUOXETINE HCL 40 MG PO CAPS: 40.0000 mg | ORAL_CAPSULE | Freq: Every day | ORAL | Status: DC

## 2014-11-17 MED ORDER — CYCLOBENZAPRINE HCL 10 MG PO TABS: ORAL_TABLET | ORAL | Status: DC

## 2014-11-17 MED ORDER — SUMATRIPTAN SUCCINATE 100 MG PO TABS: ORAL_TABLET | ORAL | Status: DC

## 2014-11-17 NOTE — Telephone Encounter (Signed)
Sent in RFs to last until pt's appt in May. Notified pt on VM

## 2014-11-17 NOTE — Telephone Encounter (Signed)
Pt needs refill on cyclobenzaprine, hyoscyamine er, Lipitor, sumtriptan, fluoxetine, diclofenac  [facility]

## 2014-11-23 ENCOUNTER — Telehealth: Payer: Self-pay

## 2014-11-23 NOTE — Telephone Encounter (Signed)
Patient is calling to let us know that she now has medicare and changed her pharmacy to [facility] on [address]. She would like allopurinol and valdsartan sent to the pharmacy. Patient phone [phone]

## 2014-11-24 MED ORDER — VALSARTAN-HYDROCHLOROTHIAZIDE 160-25 MG PO TABS
ORAL_TABLET | ORAL | Status: DC
Start: 2014-11-24 — End: 2015-01-29

## 2014-11-24 MED ORDER — ALLOPURINOL 300 MG PO TABS: ORAL_TABLET | ORAL | Status: DC

## 2014-11-24 NOTE — Telephone Encounter (Signed)
Sent rx's since she has an appt on 5/13 with Dr. Leward Quan.

## 2014-12-03 ENCOUNTER — Ambulatory Visit (INDEPENDENT_AMBULATORY_CARE_PROVIDER_SITE_OTHER): Payer: Medicare Other | Admitting: Family Medicine

## 2014-12-03 VITALS — BP 130/88 | HR 118 | Temp 98.2°F | Resp 20 | Ht 62.0 in | Wt 195.8 lb

## 2014-12-03 DIAGNOSIS — R05 Cough: Secondary | ICD-10-CM | POA: Diagnosis not present

## 2014-12-03 DIAGNOSIS — J019 Acute sinusitis, unspecified: Secondary | ICD-10-CM

## 2014-12-03 DIAGNOSIS — H6502 Acute serous otitis media, left ear: Secondary | ICD-10-CM

## 2014-12-03 DIAGNOSIS — J309 Allergic rhinitis, unspecified: Secondary | ICD-10-CM

## 2014-12-03 DIAGNOSIS — R059 Cough, unspecified: Secondary | ICD-10-CM

## 2014-12-03 MED ORDER — AMOXICILLIN-POT CLAVULANATE 875-125 MG PO TABS: 1.0000 | ORAL_TABLET | Freq: Two times a day (BID) | ORAL | Status: DC

## 2014-12-03 NOTE — Progress Notes (Signed)
Subjective:    Patient ID: Melinda Peterson, female    DOB: 04/09/1951, 64 y.o.   MRN: [id number] This chart was scribed for [name], MD by Steva Colder, ED Scribe. The patient was seen in room 12 at 6:29 PM.   Chief Complaint  Patient presents with  . Ear Pain  . Cough    x 1 day  . Headache    x 1 month  . Chills    x 1 month    HPI   Melinda Peterson is a 64 y.o. female with a medical hx of multiple medical problems and multiple medications including chronic sinusitis, GERD, HTN, cluster of Migraine HA who presents today complaining of left ear pain. Pt notes that her ear pain is radiating down her neck. Pt thinks that she is getting a sinus infection. Pt reports that she had more problems with balance last night. Pt denies stroke symptoms. She states that she is having associated symptoms of productive cough x 1 day frontal waxing and waning but constant HA x 1 month, waxing and waning chills x 1 month, intermittent sneezing, nasal congestion, sinus pressure. She states that she has tried mucinex with no relief for her symptoms. She denies stroke symptoms, slurred speech, wheezing and any other symptoms. Pt does not have an ENT, pt does not have an ENT locally. Pt is from [facility]. Pt is having cataract surgery on Tuesday, 4/26. The last sinus infection was October 2015. Pt reports that she has had sinus issues 3 times in the 4 years that she has been here. Pt reports that she was in [place] she had sinus issues all the time. Pt does not use OTC allergy medications. Pt has been using a 10 day course of Amoxicillin BID whenever she has had these symptoms. Pt denies having a heart arhythmia.      Patient Active Problem List   Diagnosis Date Noted  . Cataract of both eyes 09/10/2014  . Other and unspecified hyperlipidemia 01/13/2014  . Unspecified constipation 06/08/2013  . Personal history of colonic polyps 06/08/2013  . Tobacco user 04/17/2013  . Chronic recurrent sinusitis  01/06/2012  . GERD (gastroesophageal reflux disease) 12/26/2011  . IBS (irritable bowel syndrome) 12/26/2011  . Diverticulosis of colon 12/26/2011  . Degenerative disc disease, cervical 12/26/2011  . Obesity (BMI 30.0-34.9) 12/26/2011  . HTN (hypertension) 11/29/2011  . Gout 11/29/2011  . Mood disorder 11/29/2011   Past Medical History  Diagnosis Date  . Allergy   . Anxiety   . Eczema   . Irritable bowel syndrome   . Osteopenia   . Hyperlipidemia   . Arthritis   . Depression   . Neuromuscular disorder   . Osteoporosis   . Chronic headaches   . Colon polyps   . Diverticulosis   . GERD (gastroesophageal reflux disease)   . HTN (hypertension)   . Cataract    Past Surgical History  Procedure Laterality Date  . Total abdominal hysterectomy    . Acromioclavicularplasty    . Colonoscopy w/ polypectomy  2009, 2012  . Breast surgery Left   . Tubal ligation    . Skin cancer excision      lower back   Allergies  Allergen Reactions  . Biaxin [Clarithromycin]   . Clarithromycin Hives  . Lincomycin Hcl Hives  . Lisinopril Swelling   Prior to Admission medications   Medication Sig Start Date End Date Taking? Authorizing Provider  albuterol (PROVENTIL HFA;VENTOLIN HFA) 108 (90 BASE)  MCG/ACT inhaler Inhale 2 puffs into the lungs every 4 (four) hours as needed for wheezing or shortness of breath (cough, shortness of breath or wheezing.). 05/26/14  Yes Roselee Culver, MD  allopurinol (ZYLOPRIM) 300 MG tablet TAKE 1 TABLET BY MOUTH EVERY DAY.  "OV NEEDED FOR ADDITIONAL REFILLS" 11/24/14  Yes Barton Fanny, MD  ALPRAZolam [name]) 0.5 MG tablet Take 1 tablet by mouth 3 times day prn anxiety. 10/21/14  Yes Barton Fanny, MD  aspirin 81 MG tablet Take 81 mg by mouth daily.   Yes Historical Provider, MD  atorvastatin (LIPITOR) 20 MG tablet TAKE 1 TABLET BY MOUTH AT BEDTIME. 11/17/14  Yes Barton Fanny, MD  Blood Pressure Monitoring (BLOOD PRESSURE MONITOR/WRIST) DEVI 1  Device by Does not apply route daily. 09/10/14  Yes Barton Fanny, MD  chlorpheniramine-HYDROcodone [facility] PENNKINETIC ER) 10-8 MG/5ML LQCR Take 5 mLs by mouth every 12 (twelve) hours as needed. 09/10/14  Yes Barton Fanny, MD  cyclobenzaprine (FLEXERIL) 10 MG tablet TAKE 1 TABLET BY MOUTH TWICE A DAY AS NEEDED FOR MUSCLE SPASMS 11/17/14  Yes Barton Fanny, MD  diclofenac (VOLTAREN) 75 MG EC tablet TAKE 1 TABLET (75 MG TOTAL) BY MOUTH 2 (TWO) TIMES DAILY. TAKE WITH FOOD. 11/17/14  Yes Barton Fanny, MD  esomeprazole (NEXIUM) 40 MG capsule TAKE ONE CAPSULE BY MOUTH TWICE A DAY 08/30/14  Yes Barton Fanny, MD  FLUoxetine (PROZAC) 40 MG capsule Take 1 capsule (40 mg total) by mouth daily. 11/17/14  Yes Barton Fanny, MD  hyoscyamine (LEVBID) 0.375 MG 12 hr tablet TAKE 1 TABLET BY MOUTH EVERY 12 HOURS AS NEEDED FOR CRAMPING. 11/17/14  Yes Barton Fanny, MD  ipratropium (ATROVENT HFA) 17 MCG/ACT inhaler Inhale 2 puffs into the lungs every 6 (six) hours. 04/30/13  Yes Barton Fanny, MD  Melatonin 10 MG CAPS Take 10 mg by mouth at bedtime.   Yes Historical Provider, MD  montelukast (SINGULAIR) 10 MG tablet Take 1 tablet (10 mg total) by mouth at bedtime. 10/10/12  Yes Barton Fanny, MD  Multiple Vitamin (MULTIVITAMIN) capsule Take 1 capsule by mouth daily.   Yes Historical Provider, MD  OVER THE COUNTER MEDICATION OTC Vitamin D 3 2000 mg daily   Yes Historical Provider, MD  PRESCRIPTION MEDICATION Apply 1 mL topically daily. C-Estradiol HRT compound cream; this is compounded at [facility] ph# [phone].   Yes Barton Fanny, MD  Spacer/Aero-Holding Chambers (AEROCHAMBER PLUS FLO-VU MEDIUM) MISC 1 each by Other route once. 06/01/14  Yes Barton Fanny, MD  SUMAtriptan (IMITREX) 100 MG tablet TAKE 1 TABLET BY MOUTH EVERY 2 HOURS AS NEEDED FOR MIGRAINE 11/17/14  Yes Barton Fanny, MD  triamcinolone (NASACORT) 55 MCG/ACT AERO nasal inhaler  PLACE 2 SPRAYS INTO THE NOSE DAILY.  "OV NEEDED FOR ADDITIONAL REFILLS" 09/08/14  Yes Chelle S Jeffery, PA-C  triamcinolone cream (KENALOG) 0.1 % Apply 1 application topically 2 (two) times daily. 05/13/14  Yes Roselee Culver, MD  valsartan-hydrochlorothiazide (DIOVAN-HCT) 160-25 MG per tablet TAKE 1 TABLET BY MOUTH EVERY DAY TO LOWER BLOOD PRESSURE. 11/24/14  Yes Barton Fanny, MD  zolpidem (AMBIEN) 10 MG tablet TAKE 1 TABLET AT BEDTIME AS NEEDED FOR SLEEP 09/22/14  Yes Barton Fanny, MD  zoster vaccine live, PF, (ZOSTAVAX) 29476 UNT/0.65ML injection Inject 19,400 Units into the skin once. 09/10/14  Yes Barton Fanny, MD   History   Social History  . Marital Status: Married  Spouse Name: N/A  . Number of Children: 1  . Years of Education: N/A   Occupational History  . retired    Social History Main Topics  . Smoking status: Former Smoker -- 0.30 packs/day    Types: Cigarettes  . Smokeless tobacco: Never Used  . Alcohol Use: Yes     Comment: social  . Drug Use: No  . Sexual Activity:    Partners: Male   Other Topics Concern  . 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 She does not exercise.       Review of Systems  Constitutional: Positive for chills. Negative for fever.  HENT: Positive for congestion, ear pain, sinus pressure and sneezing.   Respiratory: Positive for cough. Negative for wheezing.   Cardiovascular: Negative for chest pain.  Neurological: Positive for headaches.       Objective:   Physical Exam  Constitutional: She is oriented to person, place, and time. She appears well-developed and well-nourished. No distress.  HENT:  Head: Normocephalic and atraumatic.  Right Ear: Hearing, tympanic membrane, external ear and ear canal normal.  Left Ear: Hearing, tympanic membrane, external ear and ear canal normal. Tympanic membrane is not erythematous and not retracted.  Nose: Right sinus exhibits  maxillary sinus tenderness and frontal sinus tenderness. Left sinus exhibits maxillary sinus tenderness and frontal sinus tenderness.  Mouth/Throat: Oropharynx is clear and moist. No oropharyngeal exudate.  minimal clear fluid behind the left TM. No erythema or retraction.  Slight abraded appearance of the left nasal septum.   Eyes: Conjunctivae and EOM are normal. Pupils are equal, round, and reactive to light.  Neck: Neck supple. No tracheal deviation present.  Cardiovascular: Normal rate, regular rhythm, normal heart sounds and intact distal pulses.   No murmur heard. Pulmonary/Chest: Effort normal and breath sounds normal. No respiratory distress. She has no wheezes. She has no rhonchi.  Musculoskeletal: Normal range of motion.  Neurological: She is alert and oriented to person, place, and time. No cranial nerve deficit. Coordination normal.  Nonfocal.   Skin: Skin is warm and dry. No rash noted.  Psychiatric: She has a normal mood and affect. Her behavior is normal.  Nursing note and vitals reviewed.      Filed Vitals:   12/03/14 1722  BP: 130/88  Pulse: 118  Temp: 98.2 F (36.8 C)  TempSrc: Oral  Resp: 20  Height: 5\' 2"  (1.575 m)  Weight: 195 lb 12.8 oz (88.814 kg)  SpO2: 95%    Assessment & Plan:   Melinda Peterson is a 64 y.o. female Subacute sinusitis, unspecified location - Plan: amoxicillin-clavulanate (AUGMENTIN) 875-125 MG per tablet  Allergic rhinitis, unspecified allergic rhinitis type  Acute serous otitis media of left ear, recurrence not specified  Cough  Suspected underlying allergic rhinitis, with secondary sinusitis. Hx of chronic sinusitis by chart, but without recent treatment. Secondary serous otitis L ear, and cough may be related to PND.   - start Augmentin, saline NS and restart steroid NS in next few days (some irritation of nasal passage on left - can hold on steroid for 2 days to allow this to heal).  - if recurrent sinus issues, discussed that ENT  eval would be ideal treatment for chronic sinusitis.   Meds ordered this encounter  Medications  . amoxicillin-clavulanate (AUGMENTIN) 875-125 MG per tablet    Sig: Take 1 tablet by mouth 2 (two) times daily.    Dispense:  20 tablet  Refill:  0   Patient Instructions  Saline nasal spray atleast 4 times per day, over the counter mucinex or mucinex DM for cough. Start Augmentin twice per day. Drink plenty of fluids. Restart flonase or nasocort in next 2 days. Return to the clinic or go to the nearest emergency room if any of your symptoms worsen or new symptoms occur.   Sinusitis Sinusitis is redness, soreness, and inflammation of the paranasal sinuses. Paranasal sinuses are air pockets within the bones of your face (beneath the eyes, the middle of the forehead, or above the eyes). In healthy paranasal sinuses, mucus is able to drain out, and air is able to circulate through them by way of your nose. However, when your paranasal sinuses are inflamed, mucus and air can become trapped. This can allow bacteria and other germs to grow and cause infection. Sinusitis can develop quickly and last only a short time (acute) or continue over a long period (chronic). Sinusitis that lasts for more than 12 weeks is considered chronic.  CAUSES  Causes of sinusitis include:  Allergies.  Structural abnormalities, such as displacement of the cartilage that separates your nostrils (deviated septum), which can decrease the air flow through your nose and sinuses and affect sinus drainage.  Functional abnormalities, such as when the small hairs (cilia) that line your sinuses and help remove mucus do not work properly or are not present. SIGNS AND SYMPTOMS  Symptoms of acute and chronic sinusitis are the same. The primary symptoms are pain and pressure around the affected sinuses. Other symptoms include:  Upper toothache.  Earache.  Headache.  Bad breath.  Decreased sense of smell and taste.  A cough,  which worsens when you are lying flat.  Fatigue.  Fever.  Thick drainage from your nose, which often is green and may contain pus (purulent).  Swelling and warmth over the affected sinuses. DIAGNOSIS  Your health care provider will perform a physical exam. During the exam, your health care provider may:  Look in your nose for signs of abnormal growths in your nostrils (nasal polyps).  Tap over the affected sinus to check for signs of infection.  View the inside of your sinuses (endoscopy) using an imaging device that has a light attached (endoscope). If your health care provider suspects that you have chronic sinusitis, one or more of the following tests may be recommended:  Allergy tests.  Nasal culture. A sample of mucus is taken from your nose, sent to a lab, and screened for bacteria.  Nasal cytology. A sample of mucus is taken from your nose and examined by your health care provider to determine if your sinusitis is related to an allergy. TREATMENT  Most cases of acute sinusitis are related to a viral infection and will resolve on their own within 10 days. Sometimes medicines are prescribed to help relieve symptoms (pain medicine, decongestants, nasal steroid sprays, or saline sprays).  However, for sinusitis related to a bacterial infection, your health care provider will prescribe antibiotic medicines. These are medicines that will help kill the bacteria causing the infection.  Rarely, sinusitis is caused by a fungal infection. In theses cases, your health care provider will prescribe antifungal medicine. For some cases of chronic sinusitis, surgery is needed. Generally, these are cases in which sinusitis recurs more than 3 times per year, despite other treatments. HOME CARE INSTRUCTIONS   Drink plenty of water. Water helps thin the mucus so your sinuses can drain more easily.  Use a humidifier.  Inhale steam 3 to 4 times a day (for example, sit in the bathroom with the  shower running).  Apply a warm, moist washcloth to your face 3 to 4 times a day, or as directed by your health care provider.  Use saline nasal sprays to help moisten and clean your sinuses.  Take medicines only as directed by your health care provider.  If you were prescribed either an antibiotic or antifungal medicine, finish it all even if you start to feel better. SEEK IMMEDIATE MEDICAL CARE IF:  You have increasing pain or severe headaches.  You have nausea, vomiting, or drowsiness.  You have swelling around your face.  You have vision problems.  You have a stiff neck.  You have difficulty breathing. MAKE SURE YOU:   Understand these instructions.  Will watch your condition.  Will get help right away if you are not doing well or get worse. Document Released: 07/30/2005 Document Revised: 12/14/2013 Document Reviewed: 08/14/2011 [facility] Patient Information 2015 [place], [place]. This information is not intended to replace advice given to you by your health care provider. Make sure you discuss any questions you have with your health care provider.   Serous Otitis Media Serous otitis media is fluid in the middle ear space. This space contains the bones for hearing and air. Air in the middle ear space helps to transmit sound.  The air gets there through the eustachian tube. This tube goes from the back of the nose (nasopharynx) to the middle ear space. It keeps the pressure in the middle ear the same as the outside world. It also helps to drain fluid from the middle ear space. CAUSES  Serous otitis media occurs when the eustachian tube gets blocked. Blockage can come from:  Ear infections.  Colds and other upper respiratory infections.  Allergies.  Irritants such as cigarette smoke.  Sudden changes in air pressure (such as descending in an airplane).  Enlarged adenoids.  A mass in the nasopharynx. During colds and upper respiratory infections, the middle ear space  can become temporarily filled with fluid. This can happen after an ear infection also. Once the infection clears, the fluid will generally drain out of the ear through the eustachian tube. If it does not, then serous otitis media occurs. SIGNS AND SYMPTOMS   Hearing loss.  A feeling of fullness in the ear, without pain.  Young children may not show any symptoms but may show slight behavioral changes, such as agitation, ear pulling, or crying. DIAGNOSIS  Serous otitis media is diagnosed by an ear exam. Tests may be done to check on the movement of the eardrum. Hearing exams may also be done. TREATMENT  The fluid most often goes away without treatment. If allergy is the cause, allergy treatment may be helpful. Fluid that persists for several months may require minor surgery. A small tube is placed in the eardrum to:  Drain the fluid.  Restore the air in the middle ear space. In certain situations, antibiotic medicines are used to avoid surgery. Surgery may be done to remove enlarged adenoids (if this is the cause). HOME CARE INSTRUCTIONS   Keep children away from tobacco smoke.  Keep all follow-up visits as directed by your health care provider. SEEK MEDICAL CARE IF:   Your hearing is not better in 3 months.  Your hearing is worse.  You have ear pain.  You have drainage from the ear.  You have dizziness.  You have serous otitis media only in one ear  or have any bleeding from your nose (epistaxis).  You notice a lump on your neck. MAKE SURE YOU:  Understand these instructions.   Will watch your condition.   Will get help right away if you are not doing well or get worse.  Document Released: 10/20/2003 Document Revised: 12/14/2013 Document Reviewed: 02/24/2013 [facility] Patient Information 2015 [place], [place]. This information is not intended to replace advice given to you by your health care provider. Make sure you discuss any questions you have with your health care  provider.  Cough, Adult  A cough is a reflex that helps clear your throat and airways. It can help heal the body or may be a reaction to an irritated airway. A cough may only last 2 or 3 weeks (acute) or may last more than 8 weeks (chronic).  CAUSES Acute cough:  Viral or bacterial infections. Chronic cough:  Infections.  Allergies.  Asthma.  Post-nasal drip.  Smoking.  Heartburn or acid reflux.  Some medicines.  Chronic lung problems (COPD).  Cancer. SYMPTOMS   Cough.  Fever.  Chest pain.  Increased breathing rate.  High-pitched whistling sound when breathing (wheezing).  Colored mucus that you cough up (sputum). TREATMENT   A bacterial cough may be treated with antibiotic medicine.  A viral cough must run its course and will not respond to antibiotics.  Your caregiver may recommend other treatments if you have a chronic cough. HOME CARE INSTRUCTIONS   Only take over-the-counter or prescription medicines for pain, discomfort, or fever as directed by your caregiver. Use cough suppressants only as directed by your caregiver.  Use a cold steam vaporizer or humidifier in your bedroom or home to help loosen secretions.  Sleep in a semi-upright position if your cough is worse at night.  Rest as needed.  Stop smoking if you smoke. SEEK IMMEDIATE MEDICAL CARE IF:   You have pus in your sputum.  Your cough starts to worsen.  You cannot control your cough with suppressants and are losing sleep.  You begin coughing up blood.  You have difficulty breathing.  You develop pain which is getting worse or is uncontrolled with medicine.  You have a fever. MAKE SURE YOU:   Understand these instructions.  Will watch your condition.  Will get help right away if you are not doing well or get worse. Document Released: 01/26/2011 Document Revised: 10/22/2011 Document Reviewed: 01/26/2011 [facility] Patient Information 2015 [place], [place]. This information is  not intended to replace advice given to you by your health care provider. Make sure you discuss any questions you have with your health care provider.     I personally performed the services described in this documentation, which was scribed in my presence. The recorded information has been reviewed and considered, and addended by me as needed.

## 2014-12-03 NOTE — Patient Instructions (Signed)
Saline nasal spray atleast 4 times per day, over the counter mucinex or mucinex DM for cough. Start Augmentin twice per day. Drink plenty of fluids. Restart flonase or nasocort in next 2 days. Return to the clinic or go to the nearest emergency room if any of your symptoms worsen or new symptoms occur.   Sinusitis Sinusitis is redness, soreness, and inflammation of the paranasal sinuses. Paranasal sinuses are air pockets within the bones of your face (beneath the eyes, the middle of the forehead, or above the eyes). In healthy paranasal sinuses, mucus is able to drain out, and air is able to circulate through them by way of your nose. However, when your paranasal sinuses are inflamed, mucus and air can become trapped. This can allow bacteria and other germs to grow and cause infection. Sinusitis can develop quickly and last only a short time (acute) or continue over a long period (chronic). Sinusitis that lasts for more than 12 weeks is considered chronic.  CAUSES  Causes of sinusitis include:  Allergies.  Structural abnormalities, such as displacement of the cartilage that separates your nostrils (deviated septum), which can decrease the air flow through your nose and sinuses and affect sinus drainage.  Functional abnormalities, such as when the small hairs (cilia) that line your sinuses and help remove mucus do not work properly or are not present. SIGNS AND SYMPTOMS  Symptoms of acute and chronic sinusitis are the same. The primary symptoms are pain and pressure around the affected sinuses. Other symptoms include:  Upper toothache.  Earache.  Headache.  Bad breath.  Decreased sense of smell and taste.  A cough, which worsens when you are lying flat.  Fatigue.  Fever.  Thick drainage from your nose, which often is green and may contain pus (purulent).  Swelling and warmth over the affected sinuses. DIAGNOSIS  Your health care provider will perform a physical exam. During the  exam, your health care provider may:  Look in your nose for signs of abnormal growths in your nostrils (nasal polyps).  Tap over the affected sinus to check for signs of infection.  View the inside of your sinuses (endoscopy) using an imaging device that has a light attached (endoscope). If your health care provider suspects that you have chronic sinusitis, one or more of the following tests may be recommended:  Allergy tests.  Nasal culture. A sample of mucus is taken from your nose, sent to a lab, and screened for bacteria.  Nasal cytology. A sample of mucus is taken from your nose and examined by your health care provider to determine if your sinusitis is related to an allergy. TREATMENT  Most cases of acute sinusitis are related to a viral infection and will resolve on their own within 10 days. Sometimes medicines are prescribed to help relieve symptoms (pain medicine, decongestants, nasal steroid sprays, or saline sprays).  However, for sinusitis related to a bacterial infection, your health care provider will prescribe antibiotic medicines. These are medicines that will help kill the bacteria causing the infection.  Rarely, sinusitis is caused by a fungal infection. In theses cases, your health care provider will prescribe antifungal medicine. For some cases of chronic sinusitis, surgery is needed. Generally, these are cases in which sinusitis recurs more than 3 times per year, despite other treatments. HOME CARE INSTRUCTIONS   Drink plenty of water. Water helps thin the mucus so your sinuses can drain more easily.  Use a humidifier.  Inhale steam 3 to 4 times a day (  for example, sit in the bathroom with the shower running).  Apply a warm, moist washcloth to your face 3 to 4 times a day, or as directed by your health care provider.  Use saline nasal sprays to help moisten and clean your sinuses.  Take medicines only as directed by your health care provider.  If you were  prescribed either an antibiotic or antifungal medicine, finish it all even if you start to feel better. SEEK IMMEDIATE MEDICAL CARE IF:  You have increasing pain or severe headaches.  You have nausea, vomiting, or drowsiness.  You have swelling around your face.  You have vision problems.  You have a stiff neck.  You have difficulty breathing. MAKE SURE YOU:   Understand these instructions.  Will watch your condition.  Will get help right away if you are not doing well or get worse. Document Released: 07/30/2005 Document Revised: 12/14/2013 Document Reviewed: 08/14/2011 [facility] Patient Information 2015 [place], [place]. This information is not intended to replace advice given to you by your health care provider. Make sure you discuss any questions you have with your health care provider.   Serous Otitis Media Serous otitis media is fluid in the middle ear space. This space contains the bones for hearing and air. Air in the middle ear space helps to transmit sound.  The air gets there through the eustachian tube. This tube goes from the back of the nose (nasopharynx) to the middle ear space. It keeps the pressure in the middle ear the same as the outside world. It also helps to drain fluid from the middle ear space. CAUSES  Serous otitis media occurs when the eustachian tube gets blocked. Blockage can come from:  Ear infections.  Colds and other upper respiratory infections.  Allergies.  Irritants such as cigarette smoke.  Sudden changes in air pressure (such as descending in an airplane).  Enlarged adenoids.  A mass in the nasopharynx. During colds and upper respiratory infections, the middle ear space can become temporarily filled with fluid. This can happen after an ear infection also. Once the infection clears, the fluid will generally drain out of the ear through the eustachian tube. If it does not, then serous otitis media occurs. SIGNS AND SYMPTOMS   Hearing  loss.  A feeling of fullness in the ear, without pain.  Young children may not show any symptoms but may show slight behavioral changes, such as agitation, ear pulling, or crying. DIAGNOSIS  Serous otitis media is diagnosed by an ear exam. Tests may be done to check on the movement of the eardrum. Hearing exams may also be done. TREATMENT  The fluid most often goes away without treatment. If allergy is the cause, allergy treatment may be helpful. Fluid that persists for several months may require minor surgery. A small tube is placed in the eardrum to:  Drain the fluid.  Restore the air in the middle ear space. In certain situations, antibiotic medicines are used to avoid surgery. Surgery may be done to remove enlarged adenoids (if this is the cause). HOME CARE INSTRUCTIONS   Keep children away from tobacco smoke.  Keep all follow-up visits as directed by your health care provider. SEEK MEDICAL CARE IF:   Your hearing is not better in 3 months.  Your hearing is worse.  You have ear pain.  You have drainage from the ear.  You have dizziness.  You have serous otitis media only in one ear or have any bleeding from your nose (epistaxis).  You notice a lump on your neck. MAKE SURE YOU:  Understand these instructions.   Will watch your condition.   Will get help right away if you are not doing well or get worse.  Document Released: 10/20/2003 Document Revised: 12/14/2013 Document Reviewed: 02/24/2013 [facility] Patient Information 2015 [place], [place]. This information is not intended to replace advice given to you by your health care provider. Make sure you discuss any questions you have with your health care provider.  Cough, Adult  A cough is a reflex that helps clear your throat and airways. It can help heal the body or may be a reaction to an irritated airway. A cough may only last 2 or 3 weeks (acute) or may last more than 8 weeks (chronic).  CAUSES Acute  cough:  Viral or bacterial infections. Chronic cough:  Infections.  Allergies.  Asthma.  Post-nasal drip.  Smoking.  Heartburn or acid reflux.  Some medicines.  Chronic lung problems (COPD).  Cancer. SYMPTOMS   Cough.  Fever.  Chest pain.  Increased breathing rate.  High-pitched whistling sound when breathing (wheezing).  Colored mucus that you cough up (sputum). TREATMENT   A bacterial cough may be treated with antibiotic medicine.  A viral cough must run its course and will not respond to antibiotics.  Your caregiver may recommend other treatments if you have a chronic cough. HOME CARE INSTRUCTIONS   Only take over-the-counter or prescription medicines for pain, discomfort, or fever as directed by your caregiver. Use cough suppressants only as directed by your caregiver.  Use a cold steam vaporizer or humidifier in your bedroom or home to help loosen secretions.  Sleep in a semi-upright position if your cough is worse at night.  Rest as needed.  Stop smoking if you smoke. SEEK IMMEDIATE MEDICAL CARE IF:   You have pus in your sputum.  Your cough starts to worsen.  You cannot control your cough with suppressants and are losing sleep.  You begin coughing up blood.  You have difficulty breathing.  You develop pain which is getting worse or is uncontrolled with medicine.  You have a fever. MAKE SURE YOU:   Understand these instructions.  Will watch your condition.  Will get help right away if you are not doing well or get worse. Document Released: 01/26/2011 Document Revised: 10/22/2011 Document Reviewed: 01/26/2011 [facility] Patient Information 2015 [place], [place]. This information is not intended to replace advice given to you by your health care provider. Make sure you discuss any questions you have with your health care provider.

## 2014-12-14 DIAGNOSIS — H2512 Age-related nuclear cataract, left eye: Secondary | ICD-10-CM | POA: Diagnosis not present

## 2014-12-24 ENCOUNTER — Ambulatory Visit (INDEPENDENT_AMBULATORY_CARE_PROVIDER_SITE_OTHER): Payer: Medicare Other | Admitting: Family Medicine

## 2014-12-24 ENCOUNTER — Other Ambulatory Visit: Payer: Self-pay | Admitting: Family Medicine

## 2014-12-24 ENCOUNTER — Encounter: Payer: Self-pay | Admitting: Family Medicine

## 2014-12-24 VITALS — BP 143/81 | HR 104 | Temp 99.3°F | Resp 16 | Ht 63.0 in | Wt 195.2 lb

## 2014-12-24 DIAGNOSIS — E669 Obesity, unspecified: Secondary | ICD-10-CM | POA: Diagnosis not present

## 2014-12-24 DIAGNOSIS — E785 Hyperlipidemia, unspecified: Secondary | ICD-10-CM | POA: Diagnosis not present

## 2014-12-24 DIAGNOSIS — I10 Essential (primary) hypertension: Secondary | ICD-10-CM | POA: Diagnosis not present

## 2014-12-24 DIAGNOSIS — R000 Tachycardia, unspecified: Secondary | ICD-10-CM

## 2014-12-24 DIAGNOSIS — J029 Acute pharyngitis, unspecified: Secondary | ICD-10-CM | POA: Diagnosis not present

## 2014-12-24 DIAGNOSIS — H9202 Otalgia, left ear: Secondary | ICD-10-CM | POA: Diagnosis not present

## 2014-12-24 MED ORDER — ALPRAZOLAM 0.5 MG PO TABS: ORAL_TABLET | ORAL | Status: DC

## 2014-12-24 MED ORDER — ZOLPIDEM TARTRATE 10 MG PO TABS: 10.0000 mg | ORAL_TABLET | Freq: Every evening | ORAL | Status: DC | PRN

## 2014-12-24 MED ORDER — CEFUROXIME AXETIL 250 MG PO TABS: 250.0000 mg | ORAL_TABLET | Freq: Two times a day (BID) | ORAL | Status: DC

## 2014-12-24 NOTE — Patient Instructions (Signed)
Barotitis Media Barotitis media is inflammation of your middle ear. This occurs when the auditory tube (eustachian tube) leading from the back of your nose (nasopharynx) to your eardrum is blocked. This blockage may result from a cold, environmental allergies, or an upper respiratory infection. Unresolved barotitis media may lead to damage or hearing loss (barotrauma), which may become permanent. HOME CARE INSTRUCTIONS   Use medicines as recommended by your health care provider. Over-the-counter medicines will help unblock the canal and can help during times of air travel.  Do not put anything into your ears to clean or unplug them. Eardrops will not be helpful.  Do not swim, dive, or fly until your health care provider says it is all right to do so. If these activities are necessary, chewing gum with frequent, forceful swallowing may help. It is also helpful to hold your nose and gently blow to pop your ears for equalizing pressure changes. This forces air into the eustachian tube.  Only take over-the-counter or prescription medicines for pain, discomfort, or fever as directed by your health care provider.  A decongestant may be helpful in decongesting the middle ear and make pressure equalization easier. SEEK MEDICAL CARE IF:  You experience a serious form of dizziness in which you feel as if the room is spinning and you feel nauseated (vertigo).  Your symptoms only involve one ear. SEEK IMMEDIATE MEDICAL CARE IF:   You develop a severe headache, dizziness, or severe ear pain.  You have bloody or pus-like drainage from your ears.  You develop a fever.  Your problems do not improve or become worse. MAKE SURE YOU:   Understand these instructions.  Will watch your condition.  Will get help right away if you are not doing well or get worse. Document Released: 07/27/2000 Document Revised: 05/20/2013 Document Reviewed: 02/24/2013 [facility] Patient Information 2015 [place], [place]. This  information is not intended to replace advice given to you by your health care provider. Make sure you discuss any questions you have with your health care provider.    You will be scheduled to see Dr. [name] in 3 months to establish care. That visit will be to review your medical history and medications. If you have an acute issue that needs attention, you can walk in at 102 or schedule with aprovider who can work you in by appointment to address only the acute issue.

## 2014-12-28 ENCOUNTER — Encounter: Payer: Self-pay | Admitting: Family Medicine

## 2014-12-28 NOTE — Progress Notes (Signed)
Subjective:    Patient ID: Melinda Peterson, female    DOB: [birth year], 64 y.o.   MRN: [id number]  HPI This 64 y.o female has HTN and is compliant w/ medications w/o adverse effects. Pt has persistent tachycardia; she reports adequate hydration, no current anemia or blood loss. She denies diaphoresis, fatigue, abnormal weight loss, CP or tightness, SOB or DOE, edema, HA, dizziness, numbness, weakness or syncope.   She recently underwent cataract removal surgery and reports water got into her L ear, resulting in L ear discomfort. Pt also has pharyngitis; she has hx of recurrent sinus infections and was actually seen by Dr. Carlota Raspberry on 12/03/2014. She had L ear complaint and cough w/ some throat symptoms at that time. Augmentin was prescribed and pt completed the antibiotic. She has low-grade fever w/o chills, discolored nasal discharge, enlarged nodes, difficulty swallowing, n/v/d, rash, HA, dizziness or weakness.  Patient Active Problem List   Diagnosis Date Noted  . Tachycardia 12/24/2014  . Cataract of both eyes 09/10/2014  . Hyperlipidemia, mild 01/13/2014  . Unspecified constipation 06/08/2013  . Personal history of colonic polyps 06/08/2013  . Tobacco user 04/17/2013  . Chronic recurrent sinusitis 01/06/2012  . GERD (gastroesophageal reflux disease) 12/26/2011  . IBS (irritable bowel syndrome) 12/26/2011  . Diverticulosis of colon 12/26/2011  . Degenerative disc disease, cervical 12/26/2011  . Obesity (BMI 30.0-34.9) 12/26/2011  . HTN (hypertension) 11/29/2011  . Gout 11/29/2011  . Mood disorder 11/29/2011    Prior to Admission medications   Medication Sig Start Date End Date Taking? Authorizing Provider  albuterol (PROVENTIL HFA;VENTOLIN HFA) 108 (90 BASE) MCG/ACT inhaler Inhale 2 puffs into the lungs every 4 (four) hours as needed for wheezing or shortness of breath (cough, shortness of breath or wheezing.). 05/26/14  Yes Roselee Culver, MD  ALPRAZolam [name]) 0.5 MG tablet Take  1 tablet by mouth 3 times day prn anxiety.   Yes Barton Fanny, MD  aspirin 81 MG tablet Take 81 mg by mouth daily.   Yes Historical Provider, MD  atorvastatin (LIPITOR) 20 MG tablet TAKE 1 TABLET BY MOUTH AT BEDTIME. 11/17/14  Yes Barton Fanny, MD  Blood Pressure Monitoring (BLOOD PRESSURE MONITOR/WRIST) DEVI 1 Device by Does not apply route daily. 09/10/14  Yes Barton Fanny, MD  cyclobenzaprine (FLEXERIL) 10 MG tablet TAKE 1 TABLET BY MOUTH TWICE A DAY AS NEEDED FOR MUSCLE SPASMS 11/17/14  Yes Barton Fanny, MD  diclofenac (VOLTAREN) 75 MG EC tablet TAKE 1 TABLET (75 MG TOTAL) BY MOUTH 2 (TWO) TIMES DAILY. TAKE WITH FOOD. 11/17/14  Yes Barton Fanny, MD  esomeprazole (NEXIUM) 40 MG capsule TAKE ONE CAPSULE BY MOUTH TWICE A DAY 08/30/14  Yes Barton Fanny, MD  FLUoxetine (PROZAC) 40 MG capsule Take 1 capsule (40 mg total) by mouth daily. 11/17/14  Yes Barton Fanny, MD  hyoscyamine (LEVBID) 0.375 MG 12 hr tablet TAKE 1 TABLET BY MOUTH EVERY 12 HOURS AS NEEDED FOR CRAMPING. 11/17/14  Yes Barton Fanny, MD  ipratropium (ATROVENT HFA) 17 MCG/ACT inhaler Inhale 2 puffs into the lungs every 6 (six) hours. 04/30/13  Yes Barton Fanny, MD  Melatonin 10 MG CAPS Take 10 mg by mouth at bedtime.   Yes Historical Provider, MD  Multiple Vitamin (MULTIVITAMIN) capsule Take 1 capsule by mouth daily.   Yes Historical Provider, MD  OVER THE COUNTER MEDICATION OTC Vitamin D 3 2000 mg daily   Yes Historical Provider, MD  [place] 1  mL topically daily. C-Estradiol HRT compound cream; this is compounded at [facility] ph# [phone].   Yes Barton Fanny, MD  Spacer/Aero-Holding Chambers (AEROCHAMBER PLUS FLO-VU MEDIUM) MISC 1 each by Other route once. 06/01/14  Yes Barton Fanny, MD  SUMAtriptan (IMITREX) 100 MG tablet TAKE 1 TABLET BY MOUTH EVERY 2 HOURS AS NEEDED FOR MIGRAINE 11/17/14  Yes Barton Fanny, MD  triamcinolone  (NASACORT) 55 MCG/ACT AERO nasal inhaler PLACE 2 SPRAYS INTO THE NOSE DAILY.  "OV NEEDED FOR ADDITIONAL REFILLS" 09/08/14  Yes Chelle Jeffery, PA-C  triamcinolone cream (KENALOG) 0.1 % Apply 1 application topically 2 (two) times daily. 05/13/14  Yes Roselee Culver, MD  valsartan-hydrochlorothiazide (DIOVAN-HCT) 160-25 MG per tablet TAKE 1 TABLET BY MOUTH EVERY DAY TO LOWER BLOOD PRESSURE. 11/24/14  Yes Barton Fanny, MD  zolpidem (AMBIEN) 10 MG tablet Take 1 tablet (10 mg total) by mouth at bedtime as needed. for sleep   Yes Barton Fanny, MD  allopurinol (ZYLOPRIM) 300 MG tablet TAKE 1 TABLET BY MOUTH EVERY DAY. NEED OFFICE VISIT FOR MORE REFILLS    Barton Fanny, MD  montelukast (SINGULAIR) 10 MG tablet Take 1 tablet (10 mg total) by mouth at bedtime. Patient not taking: Reported on 12/24/2014 10/10/12   Barton Fanny, MD  zoster vaccine live, PF, (ZOSTAVAX) 54008 UNT/0.65ML injection Inject 19,400 Units into the skin once. Patient not taking: Reported on 12/24/2014 09/10/14   Barton Fanny, MD    SURG, SOC and FAM HX reviewed.   Review of Systems As per HPI.     Objective:   Physical Exam  Constitutional: She is oriented to person, place, and time. She appears well-developed and well-nourished. No distress.  Blood pressure 143/81, pulse 104, temperature 99.3 F (37.4 C), temperature source Oral, resp. rate 16, height 5\' 3"  (1.6 m), weight 195 lb 3.2 oz (88.542 kg), SpO2 97 %.   HENT:  Head: Normocephalic and atraumatic.  Right Ear: Hearing, tympanic membrane, external ear and ear canal normal.  Left Ear: Hearing, external ear and ear canal normal. Tympanic membrane is erythematous. Tympanic membrane is not perforated and not bulging.  No middle ear effusion.  Nose: No nasal deformity or septal deviation. Right sinus exhibits maxillary sinus tenderness. Right sinus exhibits no frontal sinus tenderness. Left sinus exhibits maxillary sinus tenderness. Left sinus  exhibits no frontal sinus tenderness.  Mouth/Throat: Uvula is midline. No oral lesions. No uvula swelling. Posterior oropharyngeal erythema present. No oropharyngeal exudate.  Eyes: Conjunctivae and EOM are normal. No scleral icterus.  Neck: Normal range of motion. Neck supple.  Cardiovascular: Regular rhythm, S1 normal, S2 normal and normal heart sounds.   No extrasystoles are present. Tachycardia present.  PMI is not displaced.  Exam reveals no gallop.   No murmur heard. Pulmonary/Chest: Effort normal and breath sounds normal. No respiratory distress. She has no wheezes. She has no rales.  Musculoskeletal: Normal range of motion. She exhibits no edema or tenderness.  Lymphadenopathy:    She has no cervical adenopathy.  Neurological: She is alert and oriented to person, place, and time. No cranial nerve deficit. Coordination normal.  Skin: Skin is warm and dry. No rash noted. She is not diaphoretic. No erythema.  Nursing note and vitals reviewed.      Assessment & Plan:  Essential hypertension - Plan: COMPLETE METABOLIC PANEL WITH GFR  Tachycardia- Pt had normal ECG in June 2015 w/ heart rate= 80 bpm.  Pharyngitis- Suspect allergy- related w/  PND and constant throat-clearing.  Ear discomfort, left- RX: cefuroxime 250 mg 1 tablet twice daily for 10 days. Use other symptomatic measures outlined by Dr. Carlota Raspberry at April evaluation.  Hyperlipidemia, mild - Plan: Lipid panel  Obesity (BMI 30.0-34.9)- Encouraged weight loss; pt has weight under 200 lbs. She and husband are committed to healthier lifestyle.   Meds ordered this encounter  Medications  . zolpidem (AMBIEN) 10 MG tablet    Sig: Take 1 tablet (10 mg total) by mouth at bedtime as needed. for sleep    Dispense:  30 tablet    Refill:  2    Not to exceed 4 additional fills before 01/14/2015  . ALPRAZolam (XANAX) 0.5 MG tablet    Sig: Take 1 tablet by mouth 3 times day prn anxiety.    Dispense:  90 tablet    Refill:  2    Not  to exceed 3 additional fills before 01/14/2015  . cefUROXime (CEFTIN) 250 MG tablet    Sig: Take 1 tablet (250 mg total) by mouth 2 (two) times daily with a meal.    Dispense:  20 tablet    Refill:  0

## 2014-12-31 ENCOUNTER — Ambulatory Visit (HOSPITAL_COMMUNITY)
Admission: RE | Admit: 2014-12-31 | Discharge: 2014-12-31 | Disposition: A | Discharge status: Home / Self Care | Payer: Medicare Other | Source: Ambulatory Visit | Attending: Family Medicine | Admitting: Family Medicine

## 2014-12-31 ENCOUNTER — Ambulatory Visit (INDEPENDENT_AMBULATORY_CARE_PROVIDER_SITE_OTHER): Payer: Medicare Other | Admitting: Family Medicine

## 2014-12-31 VITALS — BP 112/78 | HR 96 | Temp 97.5°F | Resp 16 | Ht 63.0 in | Wt 192.8 lb

## 2014-12-31 DIAGNOSIS — R918 Other nonspecific abnormal finding of lung field: Secondary | ICD-10-CM | POA: Diagnosis not present

## 2014-12-31 DIAGNOSIS — R519 Headache, unspecified: Secondary | ICD-10-CM

## 2014-12-31 DIAGNOSIS — H9312 Tinnitus, left ear: Secondary | ICD-10-CM

## 2014-12-31 DIAGNOSIS — H9202 Otalgia, left ear: Secondary | ICD-10-CM

## 2014-12-31 DIAGNOSIS — R197 Diarrhea, unspecified: Secondary | ICD-10-CM

## 2014-12-31 DIAGNOSIS — R509 Fever, unspecified: Secondary | ICD-10-CM | POA: Diagnosis not present

## 2014-12-31 DIAGNOSIS — J329 Chronic sinusitis, unspecified: Secondary | ICD-10-CM | POA: Diagnosis not present

## 2014-12-31 DIAGNOSIS — H6505 Acute serous otitis media, recurrent, left ear: Secondary | ICD-10-CM | POA: Diagnosis not present

## 2014-12-31 DIAGNOSIS — R1012 Left upper quadrant pain: Secondary | ICD-10-CM | POA: Insufficient documentation

## 2014-12-31 DIAGNOSIS — D1809 Hemangioma of other sites: Secondary | ICD-10-CM | POA: Insufficient documentation

## 2014-12-31 DIAGNOSIS — R1032 Left lower quadrant pain: Secondary | ICD-10-CM | POA: Diagnosis not present

## 2014-12-31 DIAGNOSIS — H9192 Unspecified hearing loss, left ear: Secondary | ICD-10-CM

## 2014-12-31 DIAGNOSIS — R51 Headache: Secondary | ICD-10-CM

## 2014-12-31 LAB — POCT CBC
GRANULOCYTE PERCENT: 56 % (ref 37–80)
HCT, POC: 37.6 % — AB (ref 37.7–47.9)
Hemoglobin: 11.8 g/dL — AB (ref 12.2–16.2)
Lymph, poc: 3.1 (ref 0.6–3.4)
MCH, POC: 28.8 pg (ref 27–31.2)
MCHC: 31.5 g/dL — AB (ref 31.8–35.4)
MCV: 91.5 fL (ref 80–97)
MID (cbc): 0.3 (ref 0–0.9)
MPV: 6.7 fL (ref 0–99.8)
POC GRANULOCYTE: 4.4 (ref 2–6.9)
POC LYMPH PERCENT: 40 %L (ref 10–50)
POC MID %: 4 %M (ref 0–12)
Platelet Count, POC: 338 10*3/uL (ref 142–424)
RBC: 4.11 M/uL (ref 4.04–5.48)
RDW, POC: 15.7 %
WBC: 7.8 10*3/uL (ref 4.6–10.2)

## 2014-12-31 LAB — POCT URINALYSIS DIPSTICK
Bilirubin, UA: NEGATIVE
Blood, UA: NEGATIVE
GLUCOSE UA: NEGATIVE
KETONES UA: NEGATIVE
LEUKOCYTES UA: NEGATIVE
Nitrite, UA: NEGATIVE
Protein, UA: NEGATIVE
SPEC GRAV UA: 1.01
Urobilinogen, UA: 0.2
pH, UA: 7

## 2014-12-31 LAB — POCT UA - MICROSCOPIC ONLY
Bacteria, U Microscopic: NEGATIVE
CASTS, UR, LPF, POC: NEGATIVE
CRYSTALS, UR, HPF, POC: NEGATIVE
MUCUS UA: NEGATIVE
RBC, urine, microscopic: NEGATIVE
WBC, UR, HPF, POC: NEGATIVE
YEAST UA: NEGATIVE

## 2014-12-31 LAB — POCT I-STAT CREATININE: Creatinine, Ser: 1.1 mg/dL — ABNORMAL HIGH (ref 0.44–1.00)

## 2014-12-31 MED ORDER — HYDROCODONE-ACETAMINOPHEN 5-325 MG PO TABS: 1.0000 | ORAL_TABLET | Freq: Four times a day (QID) | ORAL | Status: DC | PRN

## 2014-12-31 MED ORDER — IOHEXOL 300 MG/ML  SOLN
100.0000 mL | Freq: Once | INTRAMUSCULAR | Status: AC | PRN
  Administered 2014-12-31: 100 mL via INTRAVENOUS

## 2014-12-31 NOTE — Progress Notes (Addendum)
Subjective:   This chart was scribed for [name], MD by Erling Conte, Medical Scribe. This patient was seen in Room 14 and the patient's care was started at 5:35 PM.   Patient ID: Melinda Peterson, female    DOB: Apr 17, 1951, 64 y.o.   MRN: [id number]  Chief Complaint  Patient presents with  . Follow-up    left ear pain, x 1 month ago was dx with a sinus infection  . Diarrhea    x wed. night  . Fever    x last night  . Chills    HPI HPI Comments: Melinda Peterson is a 64 y.o. female who presents to the [facility] with multiple complaints  Pt has been having intermittent, gradually worsening moderate, left otalgia for 2 weeks. She was having some relief for her otalgia and then states it became worse yesterday. Pt saw Dr. Leward Quan 1 week ago. At that time she complaining of left otalgia and fever. Per Dr. Janelle Floor note at that time she had erythema of left TM, no effusion. She was treated with Ceftin 250 mg BID for 10 days. She states she had been taking the Ceftin for relief, up until yesterday. Pt believes the Ceftin was causing her to have GI issues. She reports the left otalgia was a constant, "7/10" pain that began radiating down the left side of her jaw. She notes yesterday she had an associated subjective fever with chills. Pt states she took Tylenol for fever relief. She had cataract surgery on May 3rd, 2016. She notes some tinnitus x1 yesterday and decreased hearing in left ear. She denies any chest pain  Pt has a second complaint of intermittent, moderate, episodic diarrhea for 2 days. She states it began as loose stools 2 days ago. She notes 2-3 loose stools 2 days ago,  2-3 loose stools yesterday, which developed into diarrhea. She reports multiple episodes. Pt is complaining of associated abdominal pain and distention, which began today and decreased appetite. For relief of abdominal pain pt took Nexium and Levbid with mild relief. Pt denies any recent  foreign travel. Pt has h/o diverticulosis dx by colonoscopy. She denies blood in stools, nausea, vomiting, dysuria, or change in urination  Pt with 3rd complaint of sinus pressure. Pt has a h/o seasonal allergies. She is having associated HA and cough. She has been taking Flonase nasal spray with no relief.  Chart Review: Pt was seen December 03, 2014 with left ear pain, concern for sinus infection, also having cough and chills on and off for 1 month also having some congestion at that time. Planning on having cataract surgery 4 days after that visit. Per her chart she has a h/o chronic sinusitis but w/o recent treatment, suspected she had underlying allergies with secondary sinusitis. She was treated with augment 875 mg BID for 10 days, saline nasal spray then to restart steroid nasal spray after a few days. If sinus persisted recommended ENT evaluation for chronic sinusitis.  Patient Active Problem List   Diagnosis Date Noted  . Tachycardia 12/24/2014  . Cataract of both eyes 09/10/2014  . Hyperlipidemia, mild 01/13/2014  . Unspecified constipation 06/08/2013  . Personal history of colonic polyps 06/08/2013  . Tobacco user 04/17/2013  . Chronic recurrent sinusitis 01/06/2012  . GERD (gastroesophageal reflux disease) 12/26/2011  . IBS (irritable bowel syndrome) 12/26/2011  . Diverticulosis of colon 12/26/2011  . Degenerative disc disease, cervical 12/26/2011  . Obesity (BMI 30.0-34.9) 12/26/2011  . HTN (hypertension) 11/29/2011  .  Gout 11/29/2011  . Mood disorder 11/29/2011   Past Medical History  Diagnosis Date  . Allergy   . Anxiety   . Eczema   . Irritable bowel syndrome   . Osteopenia   . Hyperlipidemia   . Arthritis   . Depression   . Neuromuscular disorder   . Osteoporosis   . Chronic headaches   . Colon polyps   . Diverticulosis   . GERD (gastroesophageal reflux disease)   . HTN (hypertension)   . Cataract    Past Surgical History  Procedure Laterality Date  . Total  abdominal hysterectomy    . Acromioclavicularplasty    . Colonoscopy w/ polypectomy  2009, 2012  . Breast surgery Left   . Tubal ligation    . Skin cancer excision      lower back   Allergies  Allergen Reactions  . Biaxin [Clarithromycin]   . Clarithromycin Hives  . Lincomycin Hcl Hives  . Lisinopril Swelling   Prior to Admission medications   Medication Sig Start Date End Date Taking? Authorizing Provider  albuterol (PROVENTIL HFA;VENTOLIN HFA) 108 (90 BASE) MCG/ACT inhaler Inhale 2 puffs into the lungs every 4 (four) hours as needed for wheezing or shortness of breath (cough, shortness of breath or wheezing.). 05/26/14  Yes Roselee Culver, MD  allopurinol (ZYLOPRIM) 300 MG tablet TAKE 1 TABLET BY MOUTH EVERY DAY. NEED OFFICE VISIT FOR MORE REFILLS 12/25/14  Yes Barton Fanny, MD  ALPRAZolam [name]) 0.5 MG tablet Take 1 tablet by mouth 3 times day prn anxiety. 12/24/14  Yes Barton Fanny, MD  aspirin 81 MG tablet Take 81 mg by mouth daily.   Yes Historical Provider, MD  atorvastatin (LIPITOR) 20 MG tablet TAKE 1 TABLET BY MOUTH AT BEDTIME. 11/17/14  Yes Barton Fanny, MD  Blood Pressure Monitoring (BLOOD PRESSURE MONITOR/WRIST) DEVI 1 Device by Does not apply route daily. 09/10/14  Yes Barton Fanny, MD  cefUROXime (CEFTIN) 250 MG tablet Take 1 tablet (250 mg total) by mouth 2 (two) times daily with a meal. 12/24/14  Yes Barton Fanny, MD  cyclobenzaprine (FLEXERIL) 10 MG tablet TAKE 1 TABLET BY MOUTH TWICE A DAY AS NEEDED FOR MUSCLE SPASMS 11/17/14  Yes Barton Fanny, MD  diclofenac (VOLTAREN) 75 MG EC tablet TAKE 1 TABLET (75 MG TOTAL) BY MOUTH 2 (TWO) TIMES DAILY. TAKE WITH FOOD. 11/17/14  Yes Barton Fanny, MD  esomeprazole (NEXIUM) 40 MG capsule TAKE ONE CAPSULE BY MOUTH TWICE A DAY 08/30/14  Yes Barton Fanny, MD  FLUoxetine (PROZAC) 40 MG capsule Take 1 capsule (40 mg total) by mouth daily. 11/17/14  Yes Barton Fanny, MD  hyoscyamine  (LEVBID) 0.375 MG 12 hr tablet TAKE 1 TABLET BY MOUTH EVERY 12 HOURS AS NEEDED FOR CRAMPING. 11/17/14  Yes Barton Fanny, MD  ipratropium (ATROVENT HFA) 17 MCG/ACT inhaler Inhale 2 puffs into the lungs every 6 (six) hours. 04/30/13  Yes Barton Fanny, MD  Melatonin 10 MG CAPS Take 10 mg by mouth at bedtime.   Yes Historical Provider, MD  Multiple Vitamin (MULTIVITAMIN) capsule Take 1 capsule by mouth daily.   Yes Historical Provider, MD  OVER THE COUNTER MEDICATION OTC Vitamin D 3 2000 mg daily   Yes Historical Provider, MD  PRESCRIPTION MEDICATION Apply 1 mL topically daily. C-Estradiol HRT compound cream; this is compounded at [facility] ph# [phone].   Yes Barton Fanny, MD  Spacer/Aero-Holding Chambers (AEROCHAMBER PLUS FLO-VU MEDIUM) MISC  1 each by Other route once. 06/01/14  Yes Barton Fanny, MD  SUMAtriptan (IMITREX) 100 MG tablet TAKE 1 TABLET BY MOUTH EVERY 2 HOURS AS NEEDED FOR MIGRAINE 11/17/14  Yes Barton Fanny, MD  triamcinolone (NASACORT) 55 MCG/ACT AERO nasal inhaler PLACE 2 SPRAYS INTO THE NOSE DAILY.  "OV NEEDED FOR ADDITIONAL REFILLS" 09/08/14  Yes Chelle Jeffery, PA-C  triamcinolone cream (KENALOG) 0.1 % Apply 1 application topically 2 (two) times daily. 05/13/14  Yes Roselee Culver, MD  valsartan-hydrochlorothiazide (DIOVAN-HCT) 160-25 MG per tablet TAKE 1 TABLET BY MOUTH EVERY DAY TO LOWER BLOOD PRESSURE. 11/24/14  Yes Barton Fanny, MD  zolpidem (AMBIEN) 10 MG tablet Take 1 tablet (10 mg total) by mouth at bedtime as needed. for sleep 12/24/14  Yes Barton Fanny, MD  montelukast (SINGULAIR) 10 MG tablet Take 1 tablet (10 mg total) by mouth at bedtime. Patient not taking: Reported on 12/24/2014 10/10/12   Barton Fanny, MD  zoster vaccine live, PF, (ZOSTAVAX) 61607 UNT/0.65ML injection Inject 19,400 Units into the skin once. Patient not taking: Reported on 12/24/2014 09/10/14   Barton Fanny, MD   History   Social  History  . Marital Status: Married    Spouse Name: N/A  . Number of Children: 1  . Years of Education: N/A   Occupational History  . retired    Social History Main Topics  . Smoking status: Former Smoker -- 0.30 packs/day    Types: Cigarettes  . Smokeless tobacco: Never Used  . Alcohol Use: Yes     Comment: social  . Drug Use: No  . Sexual Activity:    Partners: Male   Other Topics Concern  . 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 She does not exercise.    Review of Systems  Constitutional: Positive for fever (subjective) and chills.  HENT: Positive for congestion, ear pain and tinnitus. Negative for hearing loss.   Cardiovascular: Negative for chest pain.  Gastrointestinal: Positive for abdominal pain, diarrhea and abdominal distention. Negative for nausea, vomiting and blood in stool.  Genitourinary: Negative for dysuria, hematuria and difficulty urinating.       Objective:   Physical Exam  Constitutional: She is oriented to person, place, and time. She appears well-developed and well-nourished. No distress.  HENT:  Head: Normocephalic and atraumatic.  Right Ear: Hearing, tympanic membrane, external ear and ear canal normal. No middle ear effusion.  Left Ear: Hearing, external ear and ear canal normal.  Nose: Right sinus exhibits maxillary sinus tenderness and frontal sinus tenderness. Left sinus exhibits maxillary sinus tenderness and frontal sinus tenderness.  Mouth/Throat: Oropharynx is clear and moist. No oropharyngeal exudate or posterior oropharyngeal erythema.  Left TM is pearly gray. Normal light reflux. Minimal fluid at base of TM. No erythema. Skin of ear is non tender, no rash, no decreased sensation. No external rash or vesicles in ear or canal Right TM is pearly gray Mastoid non tender on the left. tender along left cervical chain but did not feel any swollen lymph nodes Oropharynx: no vesicles  Eyes:  Conjunctivae and EOM are normal. Pupils are equal, round, and reactive to light.  Cardiovascular: Normal rate, regular rhythm, normal heart sounds and intact distal pulses.   No murmur heard. Pulmonary/Chest: Effort normal and breath sounds normal. No respiratory distress. She has no wheezes. She has no rhonchi.  Abdominal: Normal appearance. Bowel sounds are increased. There is tenderness (LLQ tenderness  greater) in the epigastric area, left upper quadrant and left lower quadrant. There is guarding. There is negative Murphy's sign.  Neurological: She is alert and oriented to person, place, and time.  Skin: Skin is warm and dry. No rash noted.  Psychiatric: She has a normal mood and affect. Her behavior is normal.  Vitals reviewed.   Filed Vitals:   12/31/14 1724  BP: 112/78  Pulse: 96  Temp: 98 F (36.7 C)  TempSrc: Oral  Resp: 16  Height: 5\' 3"  (1.6 m)  Weight: 192 lb 12.8 oz (87.454 kg)  SpO2: 97%   Results for orders placed or performed in visit on 12/31/14  POCT CBC  Result Value Ref Range   WBC 7.8 4.6 - 10.2 K/uL   Lymph, poc 3.1 0.6 - 3.4   POC LYMPH PERCENT 40.0 10 - 50 %L   MID (cbc) 0.3 0 - 0.9   POC MID % 4.0 0 - 12 %M   POC Granulocyte 4.4 2 - 6.9   Granulocyte percent 56.0 37 - 80 %G   RBC 4.11 4.04 - 5.48 M/uL   Hemoglobin 11.8 (A) 12.2 - 16.2 g/dL   HCT, POC 37.6 (A) 37.7 - 47.9 %   MCV 91.5 80 - 97 fL   MCH, POC 28.8 27 - 31.2 pg   MCHC 31.5 (A) 31.8 - 35.4 g/dL   RDW, POC 15.7 %   Platelet Count, POC 338 142 - 424 K/uL   MPV 6.7 0 - 99.8 fL  POCT urinalysis dipstick  Result Value Ref Range   Color, UA yellow    Clarity, UA clear    Glucose, UA neg    Bilirubin, UA neg    Ketones, UA neg    Spec Grav, UA 1.010    Blood, UA neg    pH, UA 7.0    Protein, UA neg    Urobilinogen, UA 0.2    Nitrite, UA neg    Leukocytes, UA Negative   POCT UA - Microscopic Only  Result Value Ref Range   WBC, Ur, HPF, POC neg    RBC, urine, microscopic neg     Bacteria, U Microscopic neg    Mucus, UA neg    Epithelial cells, urine per micros 1-2    Crystals, Ur, HPF, POC neg    Casts, Ur, LPF, POC neg    Yeast, UA neg     Assessment & Plan:   Melinda Peterson is a 64 y.o. female Diarrhea - Plan: Clostridium Difficile by PCR, Stool culture  Fever, unspecified fever cause - Plan: POCT CBC, CT Abdomen Pelvis W Contrast  Left ear pain - Plan: POCT CBC, Ambulatory referral to ENT, CT Maxillofacial WO CM  Hearing loss in left ear - Plan: Ambulatory referral to ENT, CT Maxillofacial WO CM  Tinnitus of left ear - Plan: Ambulatory referral to ENT, CT Maxillofacial WO CM  Recurrent serous otitis media of left ear, unspecified chronicity - Plan: Ambulatory referral to ENT  Recurrent sinusitis - Plan: CT Maxillofacial WO CM  LLQ abdominal pain - Plan: POCT urinalysis dipstick, POCT UA - Microscopic Only, CT Abdomen Pelvis W Contrast, Creatine, HYDROcodone-acetaminophen (NORCO/VICODIN) 5-325 MG per tablet  Left-sided face pain - Plan: CT Maxillofacial WO CM   LLQ abd pain, diarrhea after recent antibiotics.  Initial concern of possible C diff vs diverticulitis. Hx of IBS - constipation predominant.   -check stool cx and c diff.   -if diarrhea more frequent/returns tomorrow - consider flagyl  until C Diff testing returns.   -lortab if needed for abd pain. SED and risk of constipation discussed.   -CT abdomen/pelvis obtained: IMPRESSION: No acute findings within the abdomen or pelvis.  Cavernous hemangioma within the hepatic dome.  Minimal tree-in-bud nodularity within the left lower lobe potentially secondary to an infectious or inflammatory process.            -no acute abdominal findings.  possible viral gastroenteritis. Bland diet for next 2 days, cultures and treatment as above and recheck in 3 days - sooner or to ER if worse.     L sided otalgia, diffuse sinus pain after 2 separate antibiotics in past month. Suspected serous otitis  with radiating pain to jaw vs TMJ (but no pain at tmj with jaw opening/closing) vs dental pain vs chronic sinusitis.   -maxillofacial CT scan obtained: FINDINGS: Paranasal sinuses and mastoid air cells are clear. External auditory canals unremarkable. Soft tissues unremarkable. No evidence of soft tissue abscess or other abnormality. Orbital soft tissues are unremarkable. No acute bony abnormality. IMPRESSION: Unremarkable study.    -continue nasocort.  Handout on serous otitis again, and referred to ENT for further eval,  -if any rash of face or ear - return right away as DDx includes zoster, but no rash or superficial pain.   -rtc precautions.   11:14 PM Advised of results and plan on phone with patient.understanding expressed.   Meds ordered this encounter  Medications  . HYDROcodone-acetaminophen (NORCO/VICODIN) 5-325 MG per tablet    Sig: Take 1 tablet by mouth every 6 (six) hours as needed for moderate pain.    Dispense:  15 tablet    Refill:  0   Patient Instructions  Your blood count is normal tonight.  If not having further persistent diarrhea, then this is less likely colitis form antibiotics.  I will schedule CT scans tonight to look for diverticulitis, and left face pain/sinus symptoms.  Bring in stool culture as planned, and if more than 1-2 bowel movements per day - would likley need to start flagyl until c diff testing returns.  No new antibiotics for now.  hydrocodone if needed for abdominal pain, but only if needed as this can also cause constipation. I referred you to ear nose and throat.  Recheck in next few days if not improving - Return to the clinic or go to the nearest emergency room if any of your symptoms worsen or new symptoms occur.  Abdominal Pain Many things can cause abdominal pain. Usually, abdominal pain is not caused by a disease and will improve without treatment. It can often be observed and treated at home. Your health care provider will do a  physical exam and possibly order blood tests and X-rays to help determine the seriousness of your pain. However, in many cases, more time must pass before a clear cause of the pain can be found. Before that point, your health care provider may not know if you need more testing or further treatment. HOME CARE INSTRUCTIONS  Monitor your abdominal pain for any changes. The following actions may help to alleviate any discomfort you are experiencing:  Only take over-the-counter or prescription medicines as directed by your health care provider.  Do not take laxatives unless directed to do so by your health care provider.  Try a clear liquid diet (broth, tea, or water) as directed by your health care provider. Slowly move to a bland diet as tolerated. SEEK MEDICAL CARE IF:  You have unexplained abdominal  pain.  You have abdominal pain associated with nausea or diarrhea.  You have pain when you urinate or have a bowel movement.  You experience abdominal pain that wakes you in the night.  You have abdominal pain that is worsened or improved by eating food.  You have abdominal pain that is worsened with eating fatty foods.  You have a fever. SEEK IMMEDIATE MEDICAL CARE IF:   Your pain does not go away within 2 hours.  You keep throwing up (vomiting).  Your pain is felt only in portions of the abdomen, such as the right side or the left lower portion of the abdomen.  You pass bloody or black tarry stools. MAKE SURE YOU:  Understand these instructions.   Will watch your condition.   Will get help right away if you are not doing well or get worse.  Document Released: 05/09/2005 Document Revised: 08/04/2013 Document Reviewed: 04/08/2013 [facility] Patient Information 2015 [place], [place]. This information is not intended to replace advice given to you by your health care provider. Make sure you discuss any questions you have with your health care provider.   Otalgia The most common  reason for this in children is an infection of the middle ear. Pain from the middle ear is usually caused by a build-up of fluid and pressure behind the eardrum. Pain from an earache can be sharp, dull, or burning. The pain may be temporary or constant. The middle ear is connected to the nasal passages by a short narrow tube called the Eustachian tube. The Eustachian tube allows fluid to drain out of the middle ear, and helps keep the pressure in your ear equalized. CAUSES  A cold or allergy can block the Eustachian tube with inflammation and the build-up of secretions. This is especially likely in small children, because their Eustachian tube is shorter and more horizontal. When the Eustachian tube closes, the normal flow of fluid from the middle ear is stopped. Fluid can accumulate and cause stuffiness, pain, hearing loss, and an ear infection if germs start growing in this area. SYMPTOMS  The symptoms of an ear infection may include fever, ear pain, fussiness, increased crying, and irritability. Many children will have temporary and minor hearing loss during and right after an ear infection. Permanent hearing loss is rare, but the risk increases the more infections a child has. Other causes of ear pain include retained water in the outer ear canal from swimming and bathing. Ear pain in adults is less likely to be from an ear infection. Ear pain may be referred from other locations. Referred pain may be from the joint between your jaw and the skull. It may also come from a tooth problem or problems in the neck. Other causes of ear pain include:  A foreign body in the ear.  Outer ear infection.  Sinus infections.  Impacted ear wax.  Ear injury.  Arthritis of the jaw or TMJ problems.  Middle ear infection.  Tooth infections.  Sore throat with pain to the ears. DIAGNOSIS  Your caregiver can usually make the diagnosis by examining you. Sometimes other special studies, including x-rays and lab  work may be necessary. TREATMENT   If antibiotics were prescribed, use them as directed and finish them even if you or your child's symptoms seem to be improved.  Sometimes PE tubes are needed in children. These are little plastic tubes which are put into the eardrum during a simple surgical procedure. They allow fluid to drain easier and allow  the pressure in the middle ear to equalize. This helps relieve the ear pain caused by pressure changes. HOME CARE INSTRUCTIONS   Only take over-the-counter or prescription medicines for pain, discomfort, or fever as directed by your caregiver. DO NOT GIVE CHILDREN ASPIRIN because of the association of Reye's Syndrome in children taking aspirin.  Use a cold pack applied to the outer ear for 15-20 minutes, 03-04 times per day or as needed may reduce pain. Do not apply ice directly to the skin. You may cause frost bite.  Over-the-counter ear drops used as directed may be effective. Your caregiver may sometimes prescribe ear drops.  Resting in an upright position may help reduce pressure in the middle ear and relieve pain.  Ear pain caused by rapidly descending from high altitudes can be relieved by swallowing or chewing gum. Allowing infants to suck on a bottle during airplane travel can help.  Do not smoke in the house or near children. If you are unable to quit smoking, smoke outside.  Control allergies. SEEK IMMEDIATE MEDICAL CARE IF:   You or your child are becoming sicker.  Pain or fever relief is not obtained with medicine.  You or your child's symptoms (pain, fever, or irritability) do not improve within 24 to 48 hours or as instructed.  Severe pain suddenly stops hurting. This may indicate a ruptured eardrum.  You or your children develop new problems such as severe headaches, stiff neck, difficulty swallowing, or swelling of the face or around the ear. Document Released: 03/16/2004 Document Revised: 10/22/2011 Document Reviewed:  07/21/2008 [facility] Patient Information 2015 [place], [place]. This information is not intended to replace advice given to you by your health care provider. Make sure you discuss any questions you have with your health care provider.   Serous Otitis Media Serous otitis media is fluid in the middle ear space. This space contains the bones for hearing and air. Air in the middle ear space helps to transmit sound.  The air gets there through the eustachian tube. This tube goes from the back of the nose (nasopharynx) to the middle ear space. It keeps the pressure in the middle ear the same as the outside world. It also helps to drain fluid from the middle ear space. CAUSES  Serous otitis media occurs when the eustachian tube gets blocked. Blockage can come from:  Ear infections.  Colds and other upper respiratory infections.  Allergies.  Irritants such as cigarette smoke.  Sudden changes in air pressure (such as descending in an airplane).  Enlarged adenoids.  A mass in the nasopharynx. During colds and upper respiratory infections, the middle ear space can become temporarily filled with fluid. This can happen after an ear infection also. Once the infection clears, the fluid will generally drain out of the ear through the eustachian tube. If it does not, then serous otitis media occurs. SIGNS AND SYMPTOMS   Hearing loss.  A feeling of fullness in the ear, without pain.  Young children may not show any symptoms but may show slight behavioral changes, such as agitation, ear pulling, or crying. DIAGNOSIS  Serous otitis media is diagnosed by an ear exam. Tests may be done to check on the movement of the eardrum. Hearing exams may also be done. TREATMENT  The fluid most often goes away without treatment. If allergy is the cause, allergy treatment may be helpful. Fluid that persists for several months may require minor surgery. A small tube is placed in the eardrum to:  Drain  the  fluid.  Restore the air in the middle ear space. In certain situations, antibiotic medicines are used to avoid surgery. Surgery may be done to remove enlarged adenoids (if this is the cause). HOME CARE INSTRUCTIONS   Keep children away from tobacco smoke.  Keep all follow-up visits as directed by your health care provider. SEEK MEDICAL CARE IF:   Your hearing is not better in 3 months.  Your hearing is worse.  You have ear pain.  You have drainage from the ear.  You have dizziness.  You have serous otitis media only in one ear or have any bleeding from your nose (epistaxis).  You notice a lump on your neck. MAKE SURE YOU:  Understand these instructions.   Will watch your condition.   Will get help right away if you are not doing well or get worse.  Document Released: 10/20/2003 Document Revised: 12/14/2013 Document Reviewed: 02/24/2013 [facility] Patient Information 2015 [place], [place]. This information is not intended to replace advice given to you by your health care provider. Make sure you discuss any questions you have with your health care provider.     I personally performed the services described in this documentation, which was scribed in my presence. The recorded information has been reviewed and considered, and addended by me as needed.

## 2014-12-31 NOTE — Patient Instructions (Addendum)
Your blood count is normal tonight.  If not having further persistent diarrhea, then this is less likely colitis form antibiotics.  I will schedule CT scans tonight to look for diverticulitis, and left face pain/sinus symptoms.  Bring in stool culture as planned, and if more than 1-2 bowel movements per day - would likley need to start flagyl until c diff testing returns.  No new antibiotics for now.  hydrocodone if needed for abdominal pain, but only if needed as this can also cause constipation. I referred you to ear nose and throat.  Recheck in next few days if not improving - Return to the clinic or go to the nearest emergency room if any of your symptoms worsen or new symptoms occur.  Abdominal Pain Many things can cause abdominal pain. Usually, abdominal pain is not caused by a disease and will improve without treatment. It can often be observed and treated at home. Your health care provider will do a physical exam and possibly order blood tests and X-rays to help determine the seriousness of your pain. However, in many cases, more time must pass before a clear cause of the pain can be found. Before that point, your health care provider may not know if you need more testing or further treatment. HOME CARE INSTRUCTIONS  Monitor your abdominal pain for any changes. The following actions may help to alleviate any discomfort you are experiencing:  Only take over-the-counter or prescription medicines as directed by your health care provider.  Do not take laxatives unless directed to do so by your health care provider.  Try a clear liquid diet (broth, tea, or water) as directed by your health care provider. Slowly move to a bland diet as tolerated. SEEK MEDICAL CARE IF:  You have unexplained abdominal pain.  You have abdominal pain associated with nausea or diarrhea.  You have pain when you urinate or have a bowel movement.  You experience abdominal pain that wakes you in the night.  You  have abdominal pain that is worsened or improved by eating food.  You have abdominal pain that is worsened with eating fatty foods.  You have a fever. SEEK IMMEDIATE MEDICAL CARE IF:   Your pain does not go away within 2 hours.  You keep throwing up (vomiting).  Your pain is felt only in portions of the abdomen, such as the right side or the left lower portion of the abdomen.  You pass bloody or black tarry stools. MAKE SURE YOU:  Understand these instructions.   Will watch your condition.   Will get help right away if you are not doing well or get worse.  Document Released: 05/09/2005 Document Revised: 08/04/2013 Document Reviewed: 04/08/2013 [facility] Patient Information 2015 [place], [place]. This information is not intended to replace advice given to you by your health care provider. Make sure you discuss any questions you have with your health care provider.   Otalgia The most common reason for this in children is an infection of the middle ear. Pain from the middle ear is usually caused by a build-up of fluid and pressure behind the eardrum. Pain from an earache can be sharp, dull, or burning. The pain may be temporary or constant. The middle ear is connected to the nasal passages by a short narrow tube called the Eustachian tube. The Eustachian tube allows fluid to drain out of the middle ear, and helps keep the pressure in your ear equalized. CAUSES  A cold or allergy can block the Eustachian  tube with inflammation and the build-up of secretions. This is especially likely in small children, because their Eustachian tube is shorter and more horizontal. When the Eustachian tube closes, the normal flow of fluid from the middle ear is stopped. Fluid can accumulate and cause stuffiness, pain, hearing loss, and an ear infection if germs start growing in this area. SYMPTOMS  The symptoms of an ear infection may include fever, ear pain, fussiness, increased crying, and irritability.  Many children will have temporary and minor hearing loss during and right after an ear infection. Permanent hearing loss is rare, but the risk increases the more infections a child has. Other causes of ear pain include retained water in the outer ear canal from swimming and bathing. Ear pain in adults is less likely to be from an ear infection. Ear pain may be referred from other locations. Referred pain may be from the joint between your jaw and the skull. It may also come from a tooth problem or problems in the neck. Other causes of ear pain include:  A foreign body in the ear.  Outer ear infection.  Sinus infections.  Impacted ear wax.  Ear injury.  Arthritis of the jaw or TMJ problems.  Middle ear infection.  Tooth infections.  Sore throat with pain to the ears. DIAGNOSIS  Your caregiver can usually make the diagnosis by examining you. Sometimes other special studies, including x-rays and lab work may be necessary. TREATMENT   If antibiotics were prescribed, use them as directed and finish them even if you or your child's symptoms seem to be improved.  Sometimes PE tubes are needed in children. These are little plastic tubes which are put into the eardrum during a simple surgical procedure. They allow fluid to drain easier and allow the pressure in the middle ear to equalize. This helps relieve the ear pain caused by pressure changes. HOME CARE INSTRUCTIONS   Only take over-the-counter or prescription medicines for pain, discomfort, or fever as directed by your caregiver. DO NOT GIVE CHILDREN ASPIRIN because of the association of Reye's Syndrome in children taking aspirin.  Use a cold pack applied to the outer ear for 15-20 minutes, 03-04 times per day or as needed may reduce pain. Do not apply ice directly to the skin. You may cause frost bite.  Over-the-counter ear drops used as directed may be effective. Your caregiver may sometimes prescribe ear drops.  Resting in an  upright position may help reduce pressure in the middle ear and relieve pain.  Ear pain caused by rapidly descending from high altitudes can be relieved by swallowing or chewing gum. Allowing infants to suck on a bottle during airplane travel can help.  Do not smoke in the house or near children. If you are unable to quit smoking, smoke outside.  Control allergies. SEEK IMMEDIATE MEDICAL CARE IF:   You or your child are becoming sicker.  Pain or fever relief is not obtained with medicine.  You or your child's symptoms (pain, fever, or irritability) do not improve within 24 to 48 hours or as instructed.  Severe pain suddenly stops hurting. This may indicate a ruptured eardrum.  You or your children develop new problems such as severe headaches, stiff neck, difficulty swallowing, or swelling of the face or around the ear. Document Released: 03/16/2004 Document Revised: 10/22/2011 Document Reviewed: 07/21/2008 [facility] Patient Information 2015 [place], [place]. This information is not intended to replace advice given to you by your health care provider. Make sure you discuss  any questions you have with your health care provider.   Serous Otitis Media Serous otitis media is fluid in the middle ear space. This space contains the bones for hearing and air. Air in the middle ear space helps to transmit sound.  The air gets there through the eustachian tube. This tube goes from the back of the nose (nasopharynx) to the middle ear space. It keeps the pressure in the middle ear the same as the outside world. It also helps to drain fluid from the middle ear space. CAUSES  Serous otitis media occurs when the eustachian tube gets blocked. Blockage can come from:  Ear infections.  Colds and other upper respiratory infections.  Allergies.  Irritants such as cigarette smoke.  Sudden changes in air pressure (such as descending in an airplane).  Enlarged adenoids.  A mass in the  nasopharynx. During colds and upper respiratory infections, the middle ear space can become temporarily filled with fluid. This can happen after an ear infection also. Once the infection clears, the fluid will generally drain out of the ear through the eustachian tube. If it does not, then serous otitis media occurs. SIGNS AND SYMPTOMS   Hearing loss.  A feeling of fullness in the ear, without pain.  Young children may not show any symptoms but may show slight behavioral changes, such as agitation, ear pulling, or crying. DIAGNOSIS  Serous otitis media is diagnosed by an ear exam. Tests may be done to check on the movement of the eardrum. Hearing exams may also be done. TREATMENT  The fluid most often goes away without treatment. If allergy is the cause, allergy treatment may be helpful. Fluid that persists for several months may require minor surgery. A small tube is placed in the eardrum to:  Drain the fluid.  Restore the air in the middle ear space. In certain situations, antibiotic medicines are used to avoid surgery. Surgery may be done to remove enlarged adenoids (if this is the cause). HOME CARE INSTRUCTIONS   Keep children away from tobacco smoke.  Keep all follow-up visits as directed by your health care provider. SEEK MEDICAL CARE IF:   Your hearing is not better in 3 months.  Your hearing is worse.  You have ear pain.  You have drainage from the ear.  You have dizziness.  You have serous otitis media only in one ear or have any bleeding from your nose (epistaxis).  You notice a lump on your neck. MAKE SURE YOU:  Understand these instructions.   Will watch your condition.   Will get help right away if you are not doing well or get worse.  Document Released: 10/20/2003 Document Revised: 12/14/2013 Document Reviewed: 02/24/2013 [facility] Patient Information 2015 [place], [place]. This information is not intended to replace advice given to you by your health care  provider. Make sure you discuss any questions you have with your health care provider.

## 2015-01-02 LAB — CLOSTRIDIUM DIFFICILE BY PCR: CDIFFPCR: NOT DETECTED

## 2015-01-03 ENCOUNTER — Ambulatory Visit (INDEPENDENT_AMBULATORY_CARE_PROVIDER_SITE_OTHER): Payer: Medicare Other | Admitting: Family Medicine

## 2015-01-03 ENCOUNTER — Ambulatory Visit (INDEPENDENT_AMBULATORY_CARE_PROVIDER_SITE_OTHER): Payer: Medicare Other

## 2015-01-03 VITALS — BP 120/76 | HR 110 | Temp 97.7°F | Resp 14 | Ht 63.0 in | Wt 192.6 lb

## 2015-01-03 DIAGNOSIS — R1084 Generalized abdominal pain: Secondary | ICD-10-CM

## 2015-01-03 DIAGNOSIS — R059 Cough, unspecified: Secondary | ICD-10-CM

## 2015-01-03 DIAGNOSIS — R197 Diarrhea, unspecified: Secondary | ICD-10-CM | POA: Diagnosis not present

## 2015-01-03 DIAGNOSIS — R05 Cough: Secondary | ICD-10-CM

## 2015-01-03 DIAGNOSIS — J189 Pneumonia, unspecified organism: Secondary | ICD-10-CM

## 2015-01-03 DIAGNOSIS — H9202 Otalgia, left ear: Secondary | ICD-10-CM

## 2015-01-03 DIAGNOSIS — J181 Lobar pneumonia, unspecified organism: Secondary | ICD-10-CM

## 2015-01-03 LAB — POCT CBC
Granulocyte percent: 55.7 %G (ref 37–80)
HEMATOCRIT: 37.2 % — AB (ref 37.7–47.9)
HEMOGLOBIN: 12.1 g/dL — AB (ref 12.2–16.2)
Lymph, poc: 3.6 — AB (ref 0.6–3.4)
MCH, POC: 28.8 pg (ref 27–31.2)
MCHC: 32.4 g/dL (ref 31.8–35.4)
MCV: 89 fL (ref 80–97)
MID (cbc): 0.8 (ref 0–0.9)
MPV: 6.4 fL (ref 0–99.8)
POC Granulocyte: 5.5 (ref 2–6.9)
POC LYMPH PERCENT: 36.5 %L (ref 10–50)
POC MID %: 7.8 % (ref 0–12)
Platelet Count, POC: 370 10*3/uL (ref 142–424)
RBC: 4.18 M/uL (ref 4.04–5.48)
RDW, POC: 14.6 %
WBC: 9.8 10*3/uL (ref 4.6–10.2)

## 2015-01-03 MED ORDER — LEVOFLOXACIN 500 MG PO TABS: 500.0000 mg | ORAL_TABLET | Freq: Every day | ORAL | Status: DC

## 2015-01-03 NOTE — Progress Notes (Addendum)
Subjective:  This chart was scribed for Melinda Ray, MD by Melinda Peterson, medical scribe at [facility].The patient was seen in exam room 02 and the patient's care was started at 10:49 AM.   Patient ID: Melinda Peterson, female    DOB: 21-May-1951, 64 y.o.   MRN: [id number] Chief Complaint  Patient presents with  . Follow-up    Peterson Follow up, CT Scan Review, Lab Reviews   HPI HPI Comments: Melinda Peterson is a 64 y.o. female who presents to [facility] for a follow up. Last seen three days ago here for several complaints: diarrhea, fever and left ear pain. She had a CT of her maxillary facial bones that was negative for any acute findings, including no abscess or sinusitis. Also had a CT of abdomen and pelvis without any acute finding. There was an area of tree-in-bud nodularity left lower lobe without effusion as well as a stable 1.7 cm lesion in the liver compatible hemangioma. Pt was taking several antibiotics, a C-Diff was ordered which was negative, stool culture ordered currently no suspected colonies. Normal WBC on the CBC and negative urinalysis. Pt has had one episode of diarrhea on Saturday and two on Sunday, none today. No diarrhea and no blood in he stool. She felt the worst on Saturday. Decreased PO intake she felt like she was going to pass out on Sunday. Ate chicken and rice on Saturday  and became very bloated. She was able to eat soap and crackers yesterday.Temperature was 98.9 on Saturday. Pt still has a persistent cough. She  has noticed some soreness around her left ribs about a week to 10 days ago. Pt was prescribed Ceftin for a sinus infection, stopped it when diarrhea started on Wednesday.   Patient Active Problem List   Diagnosis Date Noted  . Tachycardia 12/24/2014  . Cataract of both eyes 09/10/2014  . Hyperlipidemia, mild 01/13/2014  . Unspecified constipation 06/08/2013  . Personal history of colonic polyps 06/08/2013  . Tobacco  user 04/17/2013  . Chronic recurrent sinusitis 01/06/2012  . GERD (gastroesophageal reflux disease) 12/26/2011  . IBS (irritable bowel syndrome) 12/26/2011  . Diverticulosis of colon 12/26/2011  . Degenerative disc disease, cervical 12/26/2011  . Obesity (BMI 30.0-34.9) 12/26/2011  . HTN (hypertension) 11/29/2011  . Gout 11/29/2011  . Mood disorder 11/29/2011   Past Medical History  Diagnosis Date  . Allergy   . Anxiety   . Eczema   . Irritable bowel syndrome   . Osteopenia   . Hyperlipidemia   . Arthritis   . Depression   . Neuromuscular disorder   . Osteoporosis   . Chronic headaches   . Colon polyps   . Diverticulosis   . GERD (gastroesophageal reflux disease)   . HTN (hypertension)   . Cataract    Past Surgical History  Procedure Laterality Date  . Total abdominal hysterectomy    . Acromioclavicularplasty    . Colonoscopy w/ polypectomy  2009, 2012  . Breast surgery Left   . Tubal ligation    . Skin cancer excision      lower back   Allergies  Allergen Reactions  . Biaxin [Clarithromycin]   . Clarithromycin Hives  . Lincomycin Hcl Hives  . Lisinopril Swelling   Prior to Admission medications   Medication Sig Start Date End Date Taking? Authorizing Provider  albuterol (PROVENTIL HFA;VENTOLIN HFA) 108 (90 BASE) MCG/ACT inhaler Inhale 2 puffs into the lungs every 4 (four) hours  as needed for wheezing or shortness of breath (cough, shortness of breath or wheezing.). 05/26/14  Yes Roselee Culver, MD  allopurinol (ZYLOPRIM) 300 MG tablet TAKE 1 TABLET BY MOUTH EVERY DAY. NEED OFFICE VISIT FOR MORE REFILLS 12/25/14  Yes Barton Fanny, MD  ALPRAZolam [name]) 0.5 MG tablet Take 1 tablet by mouth 3 times day prn anxiety. 12/24/14  Yes Barton Fanny, MD  aspirin 81 MG tablet Take 81 mg by mouth daily.   Yes Historical Provider, MD  atorvastatin (LIPITOR) 20 MG tablet TAKE 1 TABLET BY MOUTH AT BEDTIME. 11/17/14  Yes Barton Fanny, MD  Blood Pressure  Monitoring (BLOOD PRESSURE MONITOR/WRIST) DEVI 1 Device by Does not apply route daily. 09/10/14  Yes Barton Fanny, MD  cyclobenzaprine (FLEXERIL) 10 MG tablet TAKE 1 TABLET BY MOUTH TWICE A DAY AS NEEDED FOR MUSCLE SPASMS 11/17/14  Yes Barton Fanny, MD  diclofenac (VOLTAREN) 75 MG EC tablet TAKE 1 TABLET (75 MG TOTAL) BY MOUTH 2 (TWO) TIMES DAILY. TAKE WITH FOOD. 11/17/14  Yes Barton Fanny, MD  esomeprazole (NEXIUM) 40 MG capsule TAKE ONE CAPSULE BY MOUTH TWICE A DAY 08/30/14  Yes Barton Fanny, MD  FLUoxetine (PROZAC) 40 MG capsule Take 1 capsule (40 mg total) by mouth daily. 11/17/14  Yes Barton Fanny, MD  HYDROcodone-acetaminophen (NORCO/VICODIN) 5-325 MG per tablet Take 1 tablet by mouth every 6 (six) hours as needed for moderate pain. 12/31/14  Yes Wendie Agreste, MD  hyoscyamine (LEVBID) 0.375 MG 12 hr tablet TAKE 1 TABLET BY MOUTH EVERY 12 HOURS AS NEEDED FOR CRAMPING. 11/17/14  Yes Barton Fanny, MD  ipratropium (ATROVENT HFA) 17 MCG/ACT inhaler Inhale 2 puffs into the lungs every 6 (six) hours. 04/30/13  Yes Barton Fanny, MD  Melatonin 10 MG CAPS Take 10 mg by mouth at bedtime.   Yes Historical Provider, MD  Multiple Vitamin (MULTIVITAMIN) capsule Take 1 capsule by mouth daily.   Yes Historical Provider, MD  OVER THE COUNTER MEDICATION OTC Vitamin D 3 2000 mg daily   Yes Historical Provider, MD  PRESCRIPTION MEDICATION Apply 1 mL topically daily. C-Estradiol HRT compound cream; this is compounded at [facility] ph# [phone].   Yes Barton Fanny, MD  Spacer/Aero-Holding Chambers (AEROCHAMBER PLUS FLO-VU MEDIUM) MISC 1 each by Other route once. 06/01/14  Yes Barton Fanny, MD  SUMAtriptan (IMITREX) 100 MG tablet TAKE 1 TABLET BY MOUTH EVERY 2 HOURS AS NEEDED FOR MIGRAINE 11/17/14  Yes Barton Fanny, MD  triamcinolone (NASACORT) 55 MCG/ACT AERO nasal inhaler PLACE 2 SPRAYS INTO THE NOSE DAILY.  "OV NEEDED FOR ADDITIONAL REFILLS"  09/08/14  Yes Chelle Jeffery, PA-C  triamcinolone cream (KENALOG) 0.1 % Apply 1 application topically 2 (two) times daily. 05/13/14  Yes Roselee Culver, MD  valsartan-hydrochlorothiazide (DIOVAN-HCT) 160-25 MG per tablet TAKE 1 TABLET BY MOUTH EVERY DAY TO LOWER BLOOD PRESSURE. 11/24/14  Yes Barton Fanny, MD  zolpidem (AMBIEN) 10 MG tablet Take 1 tablet (10 mg total) by mouth at bedtime as needed. for sleep 12/24/14  Yes Barton Fanny, MD   History   Social History  . Marital Status: Married    Spouse Name: N/A  . Number of Children: 1  . Years of Education: N/A   Occupational History  . retired    Social History Main Topics  . Smoking status: Former Smoker -- 0.30 packs/day    Types: Cigarettes  . Smokeless tobacco: Never Used  .  Alcohol Use: Yes     Comment: social  . Drug Use: No  . Sexual Activity:    Partners: Male   Other Topics Concern  . 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 She does not exercise.   Review of Systems  Constitutional: Positive for appetite change. Negative for fever.  HENT: Positive for sinus pressure.   Respiratory: Positive for cough.   Gastrointestinal: Positive for vomiting. Negative for diarrhea and blood in stool.      Objective:  BP 120/76 mmHg  Pulse 110  Temp(Src) 97.7 F (36.5 C) (Oral)  Resp 14  Ht 5\' 3"  (1.6 m)  Wt 192 lb 9.6 oz (87.363 kg)  BMI 34.13 kg/m2  SpO2 98% Physical Exam  Constitutional: She is oriented to person, place, and time. She appears well-developed and well-nourished. No distress.  HENT:  Head: Normocephalic and atraumatic.  Eyes: Pupils are equal, round, and reactive to light.  Neck: Normal range of motion.  Cardiovascular: Normal rate and regular rhythm.   Pulmonary/Chest: Effort normal. No respiratory distress.  Slight tenderness over left lower rib portion but no tenderness at the LUQ of the abdomen.  Abdominal: Soft. Bowel sounds are normal.  There is no tenderness at McBurney's point and negative Murphy's sign.  Slight tenderness LLQ.  Musculoskeletal: Normal range of motion.  Neurological: She is alert and oriented to person, place, and time.  Skin: Skin is warm and dry.  Psychiatric: She has a normal mood and affect. Her behavior is normal.  Nursing note and vitals reviewed.  Results for orders placed or performed in visit on 01/03/15  POCT CBC  Result Value Ref Range   WBC 9.8 4.6 - 10.2 K/uL   Lymph, poc 3.6 (A) 0.6 - 3.4   POC LYMPH PERCENT 36.5 10 - 50 %L   MID (cbc) 0.8 0 - 0.9   POC MID % 7.8 0 - 12 %M   POC Granulocyte 5.5 2 - 6.9   Granulocyte percent 55.7 37 - 80 %G   RBC 4.18 4.04 - 5.48 M/uL   Hemoglobin 12.1 (A) 12.2 - 16.2 g/dL   HCT, POC 37.2 (A) 37.7 - 47.9 %   MCV 89.0 80 - 97 fL   MCH, POC 28.8 27 - 31.2 pg   MCHC 32.4 31.8 - 35.4 g/dL   RDW, POC 14.6 %   Platelet Count, POC 370 142 - 424 K/uL   MPV 6.4 0 - 99.8 fL    [facility] reading (PRIMARY) by  Dr. Carlota Raspberry: CXR: increased LLL markings - infiltrate.      Assessment & Plan:   Karlee Staff is a 64 y.o. female Cough - Plan: DG Chest 2 View, POCT CBC, LLL pneumonia - Plan: levofloxacin (LEVAQUIN) 500 MG tablet  - persistent with abnormality on CT for possible infection and possible LLL infiltrate on CXR.  Will treat with Levaquin. SED, and tendon risks discussed. If increasing diarrhea - stop and rtc for C diff testing (risks discussed). rtc precautions.   Diarrhea, Generalized abdominal pain - Plan: POCT CBC  -improving. Possible viral GE with underlying IBS.   Left ear pain  -improving.  Serous otitis or eustachian tube dysfunction likley as CT negative for acute findings. ENT eval pending.   rtc precautions if worsening.   Meds ordered this encounter  Medications  . levofloxacin (LEVAQUIN) 500 MG tablet    Sig: Take 1 tablet (500 mg total) by mouth daily.  Dispense:  7 tablet    Refill:  0   Patient Instructions  Diarrhea may be  virus infection, and as improving with normal stool tests at this time - no new meds or treatments at this time. Fluids most important, then bland foods as tolerated. See info below. Return to the clinic or go to the nearest emergency room if any of your symptoms worsen or new symptoms occur.  There does appear to be a possible infiltrate/infection in lower left lung.  Can start Levaquin - once per day at this time, but if cough not improving this week., fever over 100, or worsening diarrhea - return for recheck.   Diarrhea Diarrhea is frequent loose and watery bowel movements. It can cause you to feel weak and dehydrated. Dehydration can cause you to become tired and thirsty, have a dry mouth, and have decreased urination that often is dark yellow. Diarrhea is a sign of another problem, most often an infection that will not last long. In most cases, diarrhea typically lasts 2-3 days. However, it can last longer if it is a sign of something more serious. It is important to treat your diarrhea as directed by your caregiver to lessen or prevent future episodes of diarrhea. CAUSES  Some common causes include:  Gastrointestinal infections caused by viruses, bacteria, or parasites.  Food poisoning or food allergies.  Certain medicines, such as antibiotics, chemotherapy, and laxatives.  Artificial sweeteners and fructose.  Digestive disorders. HOME CARE INSTRUCTIONS  Ensure adequate fluid intake (hydration): Have 1 cup (8 oz) of fluid for each diarrhea episode. Avoid fluids that contain simple sugars or sports drinks, fruit juices, whole milk products, and sodas. Your urine should be clear or pale yellow if you are drinking enough fluids. Hydrate with an oral rehydration solution that you can purchase at pharmacies, retail stores, and online. You can prepare an oral rehydration solution at home by mixing the following ingredients together:   - tsp table salt.   tsp baking soda.   tsp salt  substitute containing potassium chloride.  1  tablespoons sugar.  1 L (34 oz) of water.  Certain foods and beverages may increase the speed at which food moves through the gastrointestinal (GI) tract. These foods and beverages should be avoided and include:  Caffeinated and alcoholic beverages.  High-fiber foods, such as raw fruits and vegetables, nuts, seeds, and whole grain breads and cereals.  Foods and beverages sweetened with sugar alcohols, such as xylitol, sorbitol, and mannitol.  Some foods may be well tolerated and may help thicken stool including:  Starchy foods, such as rice, toast, pasta, low-sugar cereal, oatmeal, grits, baked potatoes, crackers, and bagels.  Bananas.  Applesauce.  Add probiotic-rich foods to help increase healthy bacteria in the GI tract, such as yogurt and fermented milk products.  Wash your hands well after each diarrhea episode.  Only take over-the-counter or prescription medicines as directed by your caregiver.  Take a warm bath to relieve any burning or pain from frequent diarrhea episodes. SEEK IMMEDIATE MEDICAL CARE IF:   You are unable to keep fluids down.  You have persistent vomiting.  You have blood in your stool, or your stools are black and tarry.  You do not urinate in 6-8 hours, or there is only a small amount of very dark urine.  You have abdominal pain that increases or localizes.  You have weakness, dizziness, confusion, or light-headedness.  You have a severe headache.  Your diarrhea gets worse or does not  get better.  You have a fever or persistent symptoms for more than 2-3 days.  You have a fever and your symptoms suddenly get worse. MAKE SURE YOU:   Understand these instructions.  Will watch your condition.  Will get help right away if you are not doing well or get worse. Document Released: 07/20/2002 Document Revised: 12/14/2013 Document Reviewed: 04/06/2012 [facility] Patient Information 2015 [place],  [place]. This information is not intended to replace advice given to you by your health care provider. Make sure you discuss any questions you have with your health care provider.  Pneumonia Pneumonia is an infection of the lungs.  CAUSES Pneumonia may be caused by bacteria or a virus. Usually, these infections are caused by breathing infectious particles into the lungs (respiratory tract). SIGNS AND SYMPTOMS   Cough.  Fever.  Chest pain.  Increased rate of breathing.  Wheezing.  Mucus production. DIAGNOSIS  If you have the common symptoms of pneumonia, your health care provider will typically confirm the diagnosis with a chest X-Peterson. The X-Peterson will show an abnormality in the lung (pulmonary infiltrate) if you have pneumonia. Other tests of your blood, urine, or sputum may be done to find the specific cause of your pneumonia. Your health care provider may also do tests (blood gases or pulse oximetry) to see how well your lungs are working. TREATMENT  Some forms of pneumonia may be spread to other people when you cough or sneeze. You may be asked to wear a mask before and during your exam. Pneumonia that is caused by bacteria is treated with antibiotic medicine. Pneumonia that is caused by the influenza virus may be treated with an antiviral medicine. Most other viral infections must run their course. These infections will not respond to antibiotics.  HOME CARE INSTRUCTIONS   Cough suppressants may be used if you are losing too much rest. However, coughing protects you by clearing your lungs. You should avoid using cough suppressants if you can.  Your health care provider may have prescribed medicine if he or she thinks your pneumonia is caused by bacteria or influenza. Finish your medicine even if you start to feel better.  Your health care provider may also prescribe an expectorant. This loosens the mucus to be coughed up.  Take medicines only as directed by your health care  provider.  Do not smoke. Smoking is a common cause of bronchitis and can contribute to pneumonia. If you are a smoker and continue to smoke, your cough may last several weeks after your pneumonia has cleared.  A cold steam vaporizer or humidifier in your room or home may help loosen mucus.  Coughing is often worse at night. Sleeping in a semi-upright position in a recliner or using a couple pillows under your head will help with this.  Get rest as you feel it is needed. Your body will usually let you know when you need to rest. PREVENTION A pneumococcal shot (vaccine) is available to prevent a common bacterial cause of pneumonia. This is usually suggested for:  People over 47 years old.  Patients on chemotherapy.  People with chronic lung problems, such as bronchitis or emphysema.  People with immune system problems. If you are over 65 or have a high risk condition, you may receive the pneumococcal vaccine if you have not received it before. In some countries, a routine influenza vaccine is also recommended. This vaccine can help prevent some cases of pneumonia.You may be offered the influenza vaccine as part of your  care. If you smoke, it is time to quit. You may receive instructions on how to stop smoking. Your health care provider can provide medicines and counseling to help you quit. SEEK MEDICAL CARE IF: You have a fever. SEEK IMMEDIATE MEDICAL CARE IF:   Your illness becomes worse. This is especially true if you are elderly or weakened from any other disease.  You cannot control your cough with suppressants and are losing sleep.  You begin coughing up blood.  You develop pain which is getting worse or is uncontrolled with medicines.  Any of the symptoms which initially brought you in for treatment are getting worse rather than better.  You develop shortness of breath or chest pain. MAKE SURE YOU:   Understand these instructions.  Will watch your condition.  Will get  help right away if you are not doing well or get worse. Document Released: 07/30/2005 Document Revised: 12/14/2013 Document Reviewed: 10/19/2010 [facility] Patient Information 2015 [place], [place]. This information is not intended to replace advice given to you by your health care provider. Make sure you discuss any questions you have with your health care provider.         I personally performed the services described in this documentation, which was scribed in my presence. The recorded information has been reviewed and considered, and addended by me as needed.

## 2015-01-03 NOTE — Patient Instructions (Signed)
Diarrhea may be virus infection, and as improving with normal stool tests at this time - no new meds or treatments at this time. Fluids most important, then bland foods as tolerated. See info below. Return to the clinic or go to the nearest emergency room if any of your symptoms worsen or new symptoms occur.  There does appear to be a possible infiltrate/infection in lower left lung.  Can start Levaquin - once per day at this time, but if cough not improving this week., fever over 100, or worsening diarrhea - return for recheck.   Diarrhea Diarrhea is frequent loose and watery bowel movements. It can cause you to feel weak and dehydrated. Dehydration can cause you to become tired and thirsty, have a dry mouth, and have decreased urination that often is dark yellow. Diarrhea is a sign of another problem, most often an infection that will not last long. In most cases, diarrhea typically lasts 2-3 days. However, it can last longer if it is a sign of something more serious. It is important to treat your diarrhea as directed by your caregiver to lessen or prevent future episodes of diarrhea. CAUSES  Some common causes include:  Gastrointestinal infections caused by viruses, bacteria, or parasites.  Food poisoning or food allergies.  Certain medicines, such as antibiotics, chemotherapy, and laxatives.  Artificial sweeteners and fructose.  Digestive disorders. HOME CARE INSTRUCTIONS  Ensure adequate fluid intake (hydration): Have 1 cup (8 oz) of fluid for each diarrhea episode. Avoid fluids that contain simple sugars or sports drinks, fruit juices, whole milk products, and sodas. Your urine should be clear or pale yellow if you are drinking enough fluids. Hydrate with an oral rehydration solution that you can purchase at pharmacies, retail stores, and online. You can prepare an oral rehydration solution at home by mixing the following ingredients together:   - tsp table salt.   tsp baking soda.    tsp salt substitute containing potassium chloride.  1  tablespoons sugar.  1 L (34 oz) of water.  Certain foods and beverages may increase the speed at which food moves through the gastrointestinal (GI) tract. These foods and beverages should be avoided and include:  Caffeinated and alcoholic beverages.  High-fiber foods, such as raw fruits and vegetables, nuts, seeds, and whole grain breads and cereals.  Foods and beverages sweetened with sugar alcohols, such as xylitol, sorbitol, and mannitol.  Some foods may be well tolerated and may help thicken stool including:  Starchy foods, such as rice, toast, pasta, low-sugar cereal, oatmeal, grits, baked potatoes, crackers, and bagels.  Bananas.  Applesauce.  Add probiotic-rich foods to help increase healthy bacteria in the GI tract, such as yogurt and fermented milk products.  Wash your hands well after each diarrhea episode.  Only take over-the-counter or prescription medicines as directed by your caregiver.  Take a warm bath to relieve any burning or pain from frequent diarrhea episodes. SEEK IMMEDIATE MEDICAL CARE IF:   You are unable to keep fluids down.  You have persistent vomiting.  You have blood in your stool, or your stools are black and tarry.  You do not urinate in 6-8 hours, or there is only a small amount of very dark urine.  You have abdominal pain that increases or localizes.  You have weakness, dizziness, confusion, or light-headedness.  You have a severe headache.  Your diarrhea gets worse or does not get better.  You have a fever or persistent symptoms for more than 2-3 days.  You have a fever and your symptoms suddenly get worse. MAKE SURE YOU:   Understand these instructions.  Will watch your condition.  Will get help right away if you are not doing well or get worse. Document Released: 07/20/2002 Document Revised: 12/14/2013 Document Reviewed: 04/06/2012 [facility] Patient Information 2015  [place], [place]. This information is not intended to replace advice given to you by your health care provider. Make sure you discuss any questions you have with your health care provider.  Pneumonia Pneumonia is an infection of the lungs.  CAUSES Pneumonia may be caused by bacteria or a virus. Usually, these infections are caused by breathing infectious particles into the lungs (respiratory tract). SIGNS AND SYMPTOMS   Cough.  Fever.  Chest pain.  Increased rate of breathing.  Wheezing.  Mucus production. DIAGNOSIS  If you have the common symptoms of pneumonia, your health care provider will typically confirm the diagnosis with a chest X-ray. The X-ray will show an abnormality in the lung (pulmonary infiltrate) if you have pneumonia. Other tests of your blood, urine, or sputum may be done to find the specific cause of your pneumonia. Your health care provider may also do tests (blood gases or pulse oximetry) to see how well your lungs are working. TREATMENT  Some forms of pneumonia may be spread to other people when you cough or sneeze. You may be asked to wear a mask before and during your exam. Pneumonia that is caused by bacteria is treated with antibiotic medicine. Pneumonia that is caused by the influenza virus may be treated with an antiviral medicine. Most other viral infections must run their course. These infections will not respond to antibiotics.  HOME CARE INSTRUCTIONS   Cough suppressants may be used if you are losing too much rest. However, coughing protects you by clearing your lungs. You should avoid using cough suppressants if you can.  Your health care provider may have prescribed medicine if he or she thinks your pneumonia is caused by bacteria or influenza. Finish your medicine even if you start to feel better.  Your health care provider may also prescribe an expectorant. This loosens the mucus to be coughed up.  Take medicines only as directed by your health care  provider.  Do not smoke. Smoking is a common cause of bronchitis and can contribute to pneumonia. If you are a smoker and continue to smoke, your cough may last several weeks after your pneumonia has cleared.  A cold steam vaporizer or humidifier in your room or home may help loosen mucus.  Coughing is often worse at night. Sleeping in a semi-upright position in a recliner or using a couple pillows under your head will help with this.  Get rest as you feel it is needed. Your body will usually let you know when you need to rest. PREVENTION A pneumococcal shot (vaccine) is available to prevent a common bacterial cause of pneumonia. This is usually suggested for:  People over 33 years old.  Patients on chemotherapy.  People with chronic lung problems, such as bronchitis or emphysema.  People with immune system problems. If you are over 65 or have a high risk condition, you may receive the pneumococcal vaccine if you have not received it before. In some countries, a routine influenza vaccine is also recommended. This vaccine can help prevent some cases of pneumonia.You may be offered the influenza vaccine as part of your care. If you smoke, it is time to quit. You may receive instructions on how to  stop smoking. Your health care provider can provide medicines and counseling to help you quit. SEEK MEDICAL CARE IF: You have a fever. SEEK IMMEDIATE MEDICAL CARE IF:   Your illness becomes worse. This is especially true if you are elderly or weakened from any other disease.  You cannot control your cough with suppressants and are losing sleep.  You begin coughing up blood.  You develop pain which is getting worse or is uncontrolled with medicines.  Any of the symptoms which initially brought you in for treatment are getting worse rather than better.  You develop shortness of breath or chest pain. MAKE SURE YOU:   Understand these instructions.  Will watch your condition.  Will get  help right away if you are not doing well or get worse. Document Released: 07/30/2005 Document Revised: 12/14/2013 Document Reviewed: 10/19/2010 [facility] Patient Information 2015 [place], [place]. This information is not intended to replace advice given to you by your health care provider. Make sure you discuss any questions you have with your health care provider.

## 2015-01-05 LAB — STOOL CULTURE

## 2015-01-06 ENCOUNTER — Other Ambulatory Visit: Payer: Self-pay | Admitting: Physician Assistant

## 2015-01-07 ENCOUNTER — Ambulatory Visit: Payer: PRIVATE HEALTH INSURANCE | Admitting: Family Medicine

## 2015-01-11 DIAGNOSIS — Z720 Tobacco use: Secondary | ICD-10-CM | POA: Diagnosis not present

## 2015-01-11 DIAGNOSIS — H9202 Otalgia, left ear: Secondary | ICD-10-CM | POA: Diagnosis not present

## 2015-01-11 DIAGNOSIS — M2662 Arthralgia of temporomandibular joint: Secondary | ICD-10-CM | POA: Diagnosis not present

## 2015-01-12 DIAGNOSIS — H2511 Age-related nuclear cataract, right eye: Secondary | ICD-10-CM | POA: Diagnosis not present

## 2015-01-12 DIAGNOSIS — H25011 Cortical age-related cataract, right eye: Secondary | ICD-10-CM | POA: Diagnosis not present

## 2015-01-16 ENCOUNTER — Other Ambulatory Visit: Payer: Self-pay | Admitting: Family Medicine

## 2015-01-18 DIAGNOSIS — H2511 Age-related nuclear cataract, right eye: Secondary | ICD-10-CM | POA: Diagnosis not present

## 2015-01-18 DIAGNOSIS — H2512 Age-related nuclear cataract, left eye: Secondary | ICD-10-CM | POA: Diagnosis not present

## 2015-01-20 ENCOUNTER — Encounter: Payer: Self-pay | Admitting: *Deleted

## 2015-01-26 ENCOUNTER — Other Ambulatory Visit: Payer: Self-pay | Admitting: Family Medicine

## 2015-01-26 NOTE — Telephone Encounter (Signed)
Done

## 2015-01-29 ENCOUNTER — Other Ambulatory Visit: Payer: Self-pay | Admitting: Family Medicine

## 2015-01-31 NOTE — Telephone Encounter (Signed)
Rx called in 

## 2015-02-05 ENCOUNTER — Telehealth: Payer: Self-pay | Admitting: Physician Assistant

## 2015-02-05 NOTE — Telephone Encounter (Signed)
Please call this patient. It appears that she is overdue for mammography and DEXA. There are expired orders from Dr. Leward Quan from 07/2013.

## 2015-02-07 NOTE — Telephone Encounter (Signed)
Spoke with pt, she is trying to locate her records from [facility] because she had a benign breast mass in the past. She states she can do the DEXA scan. Do we need to reorder this test? [facility] will let me know when to schedule the mammogram.

## 2015-02-09 NOTE — Telephone Encounter (Signed)
I have extended the DEXA order. I'll do the same for the mammogram (so that when she's ready to schedule, it'll be there). I think we or she just need to get it scheduled.  Thanks!

## 2015-02-24 ENCOUNTER — Other Ambulatory Visit: Payer: Self-pay | Admitting: Family Medicine

## 2015-03-16 ENCOUNTER — Ambulatory Visit (INDEPENDENT_AMBULATORY_CARE_PROVIDER_SITE_OTHER): Payer: Medicare Other

## 2015-03-16 ENCOUNTER — Ambulatory Visit (INDEPENDENT_AMBULATORY_CARE_PROVIDER_SITE_OTHER): Payer: Medicare Other | Admitting: Family Medicine

## 2015-03-16 VITALS — BP 144/86 | HR 115 | Temp 98.7°F | Resp 18 | Ht 62.5 in | Wt 192.0 lb

## 2015-03-16 DIAGNOSIS — G44029 Chronic cluster headache, not intractable: Secondary | ICD-10-CM

## 2015-03-16 DIAGNOSIS — M62830 Muscle spasm of back: Secondary | ICD-10-CM | POA: Diagnosis not present

## 2015-03-16 DIAGNOSIS — R059 Cough, unspecified: Secondary | ICD-10-CM

## 2015-03-16 DIAGNOSIS — R918 Other nonspecific abnormal finding of lung field: Secondary | ICD-10-CM

## 2015-03-16 DIAGNOSIS — L570 Actinic keratosis: Secondary | ICD-10-CM | POA: Diagnosis not present

## 2015-03-16 DIAGNOSIS — R000 Tachycardia, unspecified: Secondary | ICD-10-CM

## 2015-03-16 DIAGNOSIS — N939 Abnormal uterine and vaginal bleeding, unspecified: Secondary | ICD-10-CM

## 2015-03-16 DIAGNOSIS — Z79899 Other long term (current) drug therapy: Secondary | ICD-10-CM | POA: Diagnosis not present

## 2015-03-16 DIAGNOSIS — X32XXXA Exposure to sunlight, initial encounter: Secondary | ICD-10-CM

## 2015-03-16 DIAGNOSIS — G44009 Cluster headache syndrome, unspecified, not intractable: Secondary | ICD-10-CM | POA: Diagnosis not present

## 2015-03-16 DIAGNOSIS — IMO0002 Reserved for concepts with insufficient information to code with codable children: Secondary | ICD-10-CM

## 2015-03-16 DIAGNOSIS — R05 Cough: Secondary | ICD-10-CM | POA: Diagnosis not present

## 2015-03-16 DIAGNOSIS — S29012A Strain of muscle and tendon of back wall of thorax, initial encounter: Secondary | ICD-10-CM

## 2015-03-16 DIAGNOSIS — S29019A Strain of muscle and tendon of unspecified wall of thorax, initial encounter: Secondary | ICD-10-CM | POA: Diagnosis not present

## 2015-03-16 DIAGNOSIS — Z90710 Acquired absence of both cervix and uterus: Secondary | ICD-10-CM

## 2015-03-16 LAB — POCT CBC
Granulocyte percent: 60.3 %G (ref 37–80)
HCT, POC: 39.2 % (ref 37.7–47.9)
HEMOGLOBIN: 12.5 g/dL (ref 12.2–16.2)
Lymph, poc: 3.4 (ref 0.6–3.4)
MCH, POC: 28.5 pg (ref 27–31.2)
MCHC: 31.9 g/dL (ref 31.8–35.4)
MCV: 89.3 fL (ref 80–97)
MID (CBC): 0.7 (ref 0–0.9)
MPV: 7.3 fL (ref 0–99.8)
PLATELET COUNT, POC: 328 10*3/uL (ref 142–424)
POC GRANULOCYTE: 6.2 (ref 2–6.9)
POC LYMPH PERCENT: 33.2 %L (ref 10–50)
POC MID %: 6.5 % (ref 0–12)
RBC: 4.39 M/uL (ref 4.04–5.48)
RDW, POC: 15.3 %
WBC: 10.2 10*3/uL (ref 4.6–10.2)

## 2015-03-16 LAB — TSH: TSH: 1.66 u[IU]/mL (ref 0.350–4.500)

## 2015-03-16 LAB — POCT SEDIMENTATION RATE: POCT SED RATE: 24 mm/h — AB (ref 0–22)

## 2015-03-16 MED ORDER — AZITHROMYCIN 500 MG PO TABS: 500.0000 mg | ORAL_TABLET | Freq: Every day | ORAL | Status: DC

## 2015-03-16 MED ORDER — LEVALBUTEROL HCL 0.63 MG/3ML IN NEBU
0.6300 mg | INHALATION_SOLUTION | Freq: Once | RESPIRATORY_TRACT | Status: AC
  Administered 2015-03-16: 0.63 mg via RESPIRATORY_TRACT

## 2015-03-16 MED ORDER — IPRATROPIUM BROMIDE 0.02 % IN SOLN
0.5000 mg | Freq: Once | RESPIRATORY_TRACT | Status: AC
  Administered 2015-03-16: 0.5 mg via RESPIRATORY_TRACT

## 2015-03-16 MED ORDER — MUCINEX DM MAXIMUM STRENGTH 60-1200 MG PO TB12: 1.0000 | ORAL_TABLET | Freq: Two times a day (BID) | ORAL | Status: DC

## 2015-03-16 MED ORDER — BENZONATATE 100 MG PO CAPS: 100.0000 mg | ORAL_CAPSULE | Freq: Three times a day (TID) | ORAL | Status: DC | PRN

## 2015-03-16 MED ORDER — SUMATRIPTAN SUCCINATE 100 MG PO TABS: ORAL_TABLET | ORAL | Status: DC

## 2015-03-16 MED ORDER — TIZANIDINE HCL 4 MG PO CAPS: 4.0000 mg | ORAL_CAPSULE | Freq: Three times a day (TID) | ORAL | Status: DC | PRN

## 2015-03-16 NOTE — Patient Instructions (Signed)
Mid-Back Strain with Rehab  A strain is an injury in which a tendon or muscle is torn. The muscles and tendons of the mid-back are vulnerable to strains. However, these muscles and tendons are very strong and require a great force to be injured. The muscles of the mid-back are responsible for stabilizing the spinal column, as well as spinal twisting (rotation). Strains are classified into three categories. Grade 1 strains cause pain, but the tendon is not lengthened. Grade 2 strains include a lengthened ligament, due to the ligament being stretched or partially ruptured. With grade 2 strains there is still function, although the function may be decreased. Grade 3 strains involve a complete tear of the tendon or muscle, and function is usually impaired. SYMPTOMS   Pain in the middle of the back.  Pain that may affect only one side, and is worse with movement.  Muscle spasms, and often swelling in the back.  Loss of strength of the back muscles.  Crackling sound (crepitation) when the muscles are touched. CAUSES  Mid-back strains occur when a force is placed on the muscles or tendons that is greater than they can handle. Common causes of injury include:  Ongoing overuse of the muscle-tendon units in the middle back, usually from incorrect body posture.  A single violent injury or force applied to the back. RISK INCREASES WITH:  Sports that involve twisting forces on the spine or a lot of bending at the waist (football, rugby, weightlifting, bowling, golf, tennis, speed skating, racquetball, swimming, running, gymnastics, diving).  Poor strength and flexibility.  Failure to warm up properly before activity.  Family history of low back pain or disk disorders.  Previous back injury or surgery (especially fusion). PREVENTION  Learn and use proper sports technique.  Warm up and stretch properly before activity.  Allow for adequate recovery between workouts.  Maintain physical  fitness:  Strength, flexibility, and endurance.  Cardiovascular fitness. PROGNOSIS  If treated properly, mid-back strains usually heal within 6 weeks. RELATED COMPLICATIONS   Frequently recurring symptoms, resulting in a chronic problem. Properly treating the problem the first time decreases frequency of recurrence.  Chronic inflammation, scarring, and partial muscle-tendon tear.  Delayed healing or resolution of symptoms, especially if activity is resumed too soon.  Prolonged disability. TREATMENT Treatment first involves the use of ice and medicine, to reduce pain and inflammation. As the pain begins to subside, you may begin strengthening and stretching exercises to improve body posture and sport technique. These exercises may be performed at home or with a therapist. Severe injuries may require referral to a therapist for further evaluation and treatment, such as ultrasound. Corticosteroid injections may be given to help reduce inflammation. Biofeedback (watching monitors of your body processes) and psychotherapy may also be prescribed. Prolonged bed rest is felt to do more harm than good. Massage may help break the muscle spasms. Sometimes, an injection of cortisone, with or without local anesthetics, may be given to help relieve the pain and spasms. MEDICATION   If pain medicine is needed, nonsteroidal anti-inflammatory medicines (aspirin and ibuprofen), or other minor pain relievers (acetaminophen), are often advised.  Do not take pain medicine for 7 days before surgery.  Prescription pain relievers may be given, if your caregiver thinks they are needed. Use only as directed and only as much as you need.  Ointments applied to the skin may be helpful.  Corticosteroid injections may be given by your caregiver. These injections should be reserved for the most serious cases, because  they may only be given a certain number of times. HEAT AND COLD:   Cold treatment (icing) should be  applied for 10 to 15 minutes every 2 to 3 hours for inflammation and pain, and immediately after activity that aggravates your symptoms. Use ice packs or an ice massage.  Heat treatment may be used before performing stretching and strengthening activities prescribed by your caregiver, physical therapist, or athletic trainer. Use a heat pack or a warm water soak. SEEK IMMEDIATE MEDICAL CARE IF:  Symptoms get worse or do not improve in 2 to 4 weeks, despite treatment.  You develop numbness, weakness, or loss of bowel or bladder function.  New, unexplained symptoms develop. (Drugs used in treatment may produce side effects.) EXERCISES RANGE OF MOTION (ROM) AND STRETCHING EXERCISES - Mid-Back Strain These exercises may help you when beginning to rehabilitate your injury. In order to successfully resolve your symptoms, you must improve your posture. These exercises are designed to help reduce the forward-head and rounded-shoulder posture which contributes to this condition. Your symptoms may resolve with or without further involvement from your physician, physical therapist or athletic trainer. While completing these exercises, remember:   Restoring tissue flexibility helps normal motion to return to the joints. This allows healthier, less painful movement and activity.  An effective stretch should be held for at least 30 seconds.  A stretch should never be painful. You should only feel a gentle lengthening or release in the stretched tissue. STRETCH - Axial Extension  Stand or sit on a firm surface. Assume a good posture: chest up, shoulders drawn back, stomach muscles slightly tense, knees unlocked (if standing) and feet hip width apart.  Slowly retract your chin, so your head slides back and your chin slightly lowers. Continue to look straight ahead.  You should feel a gentle stretch in the back of your head. Be certain not to feel an aggressive stretch since this can cause headaches  later.  Hold for __________ seconds. Repeat __________ times. Complete this exercise __________ times per day. RANGE OF MOTION- Upper Thoracic Extension  Sit on a firm chair with a high back. Assume a good posture: chest up, shoulders drawn back, abdominal muscles slightly tense, and feet hip width apart. Place a small pillow or folded towel in the curve of your lower back, if you are having difficulty maintaining good posture.  Gently brace your neck with your hands, allowing your arms to rest on your chest.  Continue to support your neck and slowly extend your back over the chair. You will feel a stretch across your upper back.  Hold __________ seconds. Slowly return to the starting position. Repeat __________ times. Complete this exercise __________ times per day. RANGE OF MOTION- Mid-Thoracic Extension  Roll a towel so that it is about 4 inches in diameter.  Position the towel lengthwise. Lay on the towel so that your spine, but not your shoulder blades, are supported.  You should feel your mid-back arching toward the floor. To increase the stretch, extend your arms away from your body.  Hold for __________ seconds. Repeat exercise __________ times, __________ times per day. STRENGTHENING EXERCISES - Mid-Back Strain These exercises may help you when beginning to rehabilitate your injury. They may resolve your symptoms with or without further involvement from your physician, physical therapist or athletic trainer. While completing these exercises, remember:   Muscles can gain both the endurance and the strength needed for everyday activities through controlled exercises.  Complete these exercises as instructed by  your physician, physical therapist or athletic trainer. Increase the resistance and repetitions only as guided by your caregiver.  You may experience muscle soreness or fatigue, but the pain or discomfort you are trying to eliminate should never worsen during these  exercises. If this pain does worsen, stop and make certain you are following the directions exactly. If the pain is still present after adjustments, discontinue the exercise until you can discuss the trouble with your caregiver. STRENGTHENING - Quadruped, Opposite UE/LE Lift  Assume a hands and knees position on a firm surface. Keep your hands under your shoulders and your knees under your hips. You may place padding under your knees for comfort.  Find your neutral spine and gently tense your abdominal muscles so that you can maintain this position. Your shoulders and hips should form a rectangle that is parallel with the floor and is not twisted.  Keeping your trunk steady, lift your right hand no higher than your shoulder and then your left leg no higher than your hip. Make sure you are not holding your breath. Hold this position __________ seconds.  Continuing to keep your abdominal muscles tense and your back steady, slowly return to your starting position. Repeat with the opposite arm and leg. Repeat __________ times. Complete this exercise __________ times per day.  STRENGTH - Shoulder Extensors  Secure a rubber exercise band or tubing to a fixed object (table, pole) so that it is at the height of your shoulders when you are either standing, or sitting on a firm armless chair.  With a thumbs-up grip, grasp an end of the band in each hand. Straighten your elbows and lift your hands straight in front of you at shoulder height. Step back away from the secured end of band, until it becomes tense.  Squeezing your shoulder blades together, pull your hands down to the sides of your thighs. Do not allow your hands to go behind you.  Hold for __________ seconds. Slowly ease the tension on the band, as you reverse the directions and return to the starting position. Repeat __________ times. Complete this exercise __________ times per day.  STRENGTH - Horizontal Abductors Choose one of the two  positions to complete this exercise. Prone: lying on stomach:  Lie on your stomach on a firm surface so that your right / left arm overhangs the edge. Rest your forehead on your opposite forearm. With your palm facing the floor and your elbow straight, hold a __________ weight in your hand.  Squeeze your right / left shoulder blade to your mid-back spine and then slowly raise your arm to the height of the bed.  Hold for __________ seconds. Slowly reverse the directions and return to the starting position, controlling the weight as you lower your arm. Repeat __________ times. Complete this exercise __________ times per day. Standing:   Secure a rubber exercise band or tubing, so that it is at the height of your shoulders when you are either standing, or sitting on a firm armless chair.  Grasp an end of the band in each hand and have your palms face each other. Straighten your elbows and lift your hands straight in front of you at shoulder height. Step back away from the secured end of band, until it becomes tense.  Squeeze your shoulder blades together. Keeping your elbows locked and your hands at shoulder height, spread your arms apart, forming a "T" shape with your body. Hold __________ seconds. Slowly ease the tension on the band, as  you reverse the directions and return to the starting position. Repeat __________ times. Complete this exercise __________ times per day. STRENGTH - Scapular Retractors and External Rotators, Rowing  Secure a rubber exercise band or tubing, so that it is at the height of your shoulders when you are either standing, or sitting on a firm armless chair.  With a palm-down grip, grasp an end of the band in each hand. Straighten your elbows and lift your hands straight in front of you at shoulder height. Step back away from the secured end of band, until it becomes tense.  Step 1: Squeeze your shoulder blades together. Bending your elbows, draw your hands to your  chest as if you are rowing a boat. At the end of this motion, your hands and elbow should be at shoulder height and your elbows should be out to your sides.  Step 2: Rotate your shoulder to raise your hands above your head. Your forearms should be vertical and your upper arms should be horizontal.  Hold for __________ seconds. Slowly ease the tension on the band, as you reverse the directions and return to the starting position. Repeat __________ times. Complete this exercise __________ times per day.  POSTURE AND BODY MECHANICS CONSIDERATIONS - Mid-Back Strain Keeping correct posture when sitting, standing or completing your activities will reduce the stress put on different body tissues, allowing injured tissues a chance to heal and limiting painful experiences. The following are general guidelines for improved posture. Your physician or physical therapist will provide you with any instructions specific to your needs. While reading these guidelines, remember:  The exercises prescribed by your provider will help you have the flexibility and strength to maintain correct postures.  The correct posture provides the best environment for your joints to work. All of your joints have less wear and tear when properly supported by a spine with good posture. This means you will experience a healthier, less painful body.  Correct posture must be practiced with all of your activities, especially prolonged sitting and standing. Correct posture is as important when doing repetitive low-stress activities (typing) as it is when doing a single heavy-load activity (lifting). PROPER SITTING POSTURE In order to minimize stress and discomfort on your spine, you must sit with correct posture. Sitting with good posture should be effortless for a healthy body. Returning to good posture is a gradual process. Many people can work toward this most comfortably by using various supports until they have the flexibility and  strength to maintain this posture on their own. When sitting with proper posture, your ears will fall over your shoulders and your shoulders will fall over your hips. You should use the back of the chair to support your upper back. Your lower back will be in a neutral position, just slightly arched. You may place a small pillow or folded towel at the base of your low back for  support.  When working at a desk, create an environment that supports good, upright posture. Without extra support, muscles fatigue and lead to excessive strain on joints and other tissues. Keep these recommendations in mind: CHAIR:  A chair should be able to slide under your desk when your back makes contact with the back of the chair. This allows you to work closely.  The chair's height should allow your eyes to be level with the upper part of your monitor and your hands to be slightly lower than your elbows. BODY POSITION  Your feet should make contact with  the floor. If this is not possible, use a foot rest.  Keep your ears over your shoulders. This will reduce stress on your neck and lower back. INCORRECT SITTING POSTURES If you are feeling tired and unable to assume a healthy sitting posture, do not slouch or slump. This puts excessive strain on your back tissues, causing more damage and pain. Healthier options include:  Using more support, like a lumbar pillow.  Switching tasks to something that requires you to be upright or walking.  Talking a brief walk.  Lying down to rest in a neutral-spine position. CORRECT STANDING POSTURES Proper standing posture should be assumed with all daily activities, even if they only take a few moments, like when brushing your teeth. As in sitting, your ears should fall over your shoulders and your shoulders should fall over your hips. You should keep a slight tension in your abdominal muscles to brace your spine. Your tailbone should point down to the ground, not behind your  body, resulting in an over-extended swayback posture.  INCORRECT STANDING POSTURES Common incorrect standing postures include a forward head, locked knees, and an excessive swayback. WALKING Walk with an upright posture. Your ears, shoulders and hips should all line-up. CORRECT LIFTING TECHNIQUES DO :   Assume a wide stance. This will provide you more stability and the opportunity to get as close as possible to the object which you are lifting.  Tense your abdominals to brace your spine. Bend at the knees and hips. Keeping your back locked in a neutral-spine position, lift using your leg muscles. Lift with your legs, keeping your back straight.  Test the weight of unknown objects before attempting to lift them.  Try to keep your elbows locked down at your sides in order get the best strength from your shoulders when carrying an object.  Always ask for help when lifting heavy or awkward objects. INCORRECT LIFTING TECHNIQUES DO NOT:   Lock your knees when lifting, even if it is a small object.  Bend and twist. Pivot at your feet or move your feet when needing to change directions.  Assume that you can safely pick up even a paperclip without proper posture. Document Released: 07/30/2005 Document Revised: 12/14/2013 Document Reviewed: 11/11/2008 [facility] Patient Information 2015 [place], [place]. This information is not intended to replace advice given to you by your health care provider. Make sure you discuss any questions you have with your health care provider.

## 2015-03-16 NOTE — Progress Notes (Addendum)
Subjective:  This chart was scribed for [name] MD,  by Tamsen Roers, at [facility].  This patient was seen in room 2 and the patient's care was started at 1:47 PM.    Patient ID: Melinda Peterson, female    DOB: June 15, 1951, 64 y.o.   MRN: [id number] Chief Complaint  Patient presents with  . Follow-up    pneumonia   . Back Pain    started monday     HPI HPI Comments: Melinda Peterson is a 64 y.o. female who presents to the [facility] for a follow up regarding her pneumonia and is complaining of back pain.  Patient also has been having pain in her right thoracic back area which she thinks is due to her coughing.   Patient notes that she had a cat scan in May which showed left lower lobe pneumonia and verified here two days ago after getting a chest X-ray by Dr. Carlota Raspberry, who put her on antibiotics.  Before the month of May, she had bronchitis which she thinks developed into pnuemonia because she did not take any antibiotics.  For the past 2 months she has been using Levaquin but states that her cough (clear) is getting progressively worse. Patient does not have any history of any lung problems and has not seen a pulmunologist in the past.  Patient states that after taking the medication, she started getting chills in her legs and felt "hot above her neck".  Patient has a history of bilateral cataract surgery.  Patient denies any fever/chills.   ------ Patient had a cat scan of her abdomen in May, which showed (tree-in bud- could be infectious or inflammatory) radiologist thought everything was clear.     Pt adament that her baseline temp is 97 so will have a "fever" when her temp is normal. Tried to explain to pt that most people fall on the edges of a normal range so "temp" threshholds for fever still apply but she is reluctant to accept this.  Past Medical History  Diagnosis Date  . Allergy   . Anxiety   . Eczema   . Irritable bowel syndrome   .  Osteopenia   . Hyperlipidemia   . Arthritis   . Depression   . Neuromuscular disorder   . Osteoporosis   . Chronic headaches   . Colon polyps   . Diverticulosis   . GERD (gastroesophageal reflux disease)   . HTN (hypertension)   . Cataract     Current Outpatient Prescriptions on File Prior to Visit  Medication Sig Dispense Refill  . albuterol (PROVENTIL HFA;VENTOLIN HFA) 108 (90 BASE) MCG/ACT inhaler Inhale 2 puffs into the lungs every 4 (four) hours as needed for wheezing or shortness of breath (cough, shortness of breath or wheezing.). 1 Inhaler 1  . allopurinol (ZYLOPRIM) 300 MG tablet TAKE 1 TABLET BY MOUTH EVERY DAY 30 tablet 0  . ALPRAZolam (XANAX) 0.5 MG tablet Take 1 tablet by mouth 3 times day prn anxiety. 90 tablet 2  . aspirin 81 MG tablet Take 81 mg by mouth daily.    Marland Kitchen atorvastatin (LIPITOR) 20 MG tablet TAKE 1 TABLET BY MOUTH AT BEDTIME 30 tablet 5  . Blood Pressure Monitoring (BLOOD PRESSURE MONITOR/WRIST) DEVI 1 Device by Does not apply route daily. 1 Device 0  . cyclobenzaprine (FLEXERIL) 10 MG tablet TAKE 1 TABLET BY MOUTH TWICE A DAY AS NEEDED FOR MUSCLE SPASMS 60 tablet 1  . diclofenac (VOLTAREN)  75 MG EC tablet TAKE 1 TABLET (75 MG TOTAL) BY MOUTH 2 (TWO) TIMES DAILY. TAKE WITH FOOD. 60 tablet 1  . esomeprazole (NEXIUM) 40 MG capsule TAKE 1 CAPSULE BY MOUTH TWICE DAILY 60 capsule 1  . FLUoxetine (PROZAC) 40 MG capsule TAKE 1 CAPSULE(40 MG) BY MOUTH DAILY 30 capsule 5  . HYDROcodone-acetaminophen (NORCO/VICODIN) 5-325 MG per tablet Take 1 tablet by mouth every 6 (six) hours as needed for moderate pain. 15 tablet 0  . hyoscyamine (LEVBID) 0.375 MG 12 hr tablet TAKE 1 TABLET BY MOUTH EVERY 12 HOURS AS NEEDED FOR CRAMPING. 60 tablet 1  . ipratropium (ATROVENT HFA) 17 MCG/ACT inhaler Inhale 2 puffs into the lungs every 6 (six) hours. 1 Inhaler 5  . levofloxacin (LEVAQUIN) 500 MG tablet Take 1 tablet (500 mg total) by mouth daily. 7 tablet 0  . Melatonin 10 MG CAPS Take  10 mg by mouth at bedtime.    . Multiple Vitamin (MULTIVITAMIN) capsule Take 1 capsule by mouth daily.    Marland Kitchen OVER THE COUNTER MEDICATION OTC Vitamin D 3 2000 mg daily    . PRESCRIPTION MEDICATION Apply 1 mL topically daily. C-Estradiol HRT compound cream; this is compounded at [facility] ph# [phone]. 30 g 5  . Spacer/Aero-Holding Chambers (AEROCHAMBER PLUS FLO-VU MEDIUM) MISC 1 each by Other route once. 1 each 0  . SUMAtriptan (IMITREX) 100 MG tablet TAKE 1 TABLET BY MOUTH EVERY 2 HOURS AS NEEDED FOR MIGRAINE 10 tablet 1  . triamcinolone (NASACORT) 55 MCG/ACT AERO nasal inhaler PLACE 2 SPRAYS INTO THE NOSE DAILY.  "OV NEEDED FOR ADDITIONAL REFILLS" 1 Inhaler 0  . triamcinolone cream (KENALOG) 0.1 % Apply 1 application topically 2 (two) times daily. 30 g 0  . valsartan-hydrochlorothiazide (DIOVAN-HCT) 160-25 MG per tablet TAKE 1 TABLET BY MOUTH EVERY DAY TO LOWER BLOOD PRESSURE 30 tablet 1  . zolpidem (AMBIEN) 10 MG tablet TAKE 1 TABLET BY MOUTH EVERY NIGHT AT BEDTIME AS NEEDED FOR SLEEP 30 tablet 0   No current facility-administered medications on file prior to visit.    Allergies  Allergen Reactions  . Biaxin [Clarithromycin]   . Clarithromycin Hives  . Lincomycin Hcl Hives  . Lisinopril Swelling      Review of Systems  Constitutional: Positive for fever, chills, diaphoresis, activity change and fatigue. Negative for appetite change and unexpected weight change.  HENT: Positive for congestion, postnasal drip and rhinorrhea. Negative for ear pain, sneezing and sore throat.   Eyes: Negative for photophobia, pain, discharge and itching.  Respiratory: Positive for cough. Negative for shortness of breath and wheezing.   Cardiovascular: Negative for chest pain and leg swelling.  Gastrointestinal: Negative for nausea and vomiting.  Musculoskeletal: Positive for myalgias, back pain and arthralgias. Negative for joint swelling.  Allergic/Immunologic: Negative for  immunocompromised state.  Neurological: Negative for syncope and speech difficulty.  Hematological: Positive for adenopathy.       Objective:   Physical Exam  Constitutional: She is oriented to person, place, and time. She appears well-developed and well-nourished. No distress.  HENT:  Head: Normocephalic and atraumatic.  Mouth/Throat: No oropharyngeal exudate.  Minor left mid ear effusion normal right TM Nares are normal   Eyes: Conjunctivae and EOM are normal. Pupils are equal, round, and reactive to light.  Neck: No thyromegaly present.  No anterior cervical lymphadenopathy.   Cardiovascular: Normal rate, regular rhythm, S1 normal and S2 normal.   No murmur heard. Tachycardic   Pulmonary/Chest: Effort normal and breath sounds normal.  No respiratory distress.  Excellent air movement throughout , lungs are clear.   Musculoskeletal: Normal range of motion.  Neurological: She is alert and oriented to person, place, and time.  Skin: Skin is warm and dry.  Psychiatric: She has a normal mood and affect. Her behavior is normal.  Nursing note and vitals reviewed.  Filed Vitals:   03/16/15 1121  BP: 144/86  Pulse: 115  Temp: 98.7 F (37.1 C)  TempSrc: Oral  Resp: 18  Height: 5' 2.5" (1.588 m)  Weight: 192 lb (87.091 kg)  SpO2: 98%     [facility] reading (PRIMARY) by  Dr. Brigitte Pulse. CXR: normal, no acute abnormality  Neg orthostatics . EKG: Sinus tachycardia.  No ischemic change  Peak flow 310 -> Goal peak flow 425 - after xopenex and atrovent neb -> 330  Assessment & Plan:   1. Cough - no h/o any pulmonary disease and no response to neb in office or inhaler at home.  Peak flow mildly reduced but did not reverse w/ neb.  Check chest CT, consider spirometry +/- pulmonary referral.  Pt distressed about her illness and feeling horrible for along time - vacation coming - so will proceed w/ coverage with azithro 500 qd x 3d w/ prn tessalon OR mucinex DM.  2. Abnormal CT scan, lung -  norm CXR immed after abnormal tree-in-bud finding c/w infection/inflammation seen on abd CT, norm CXR again today. Cbc has been normal throughout. Pt initially had partial response to course of levaquin  3. Tachycardia - per pt worsening but not bothering her, asymptomatic, on chart review she has consistently been tachy 90-120 for several yrs prior.  Check outside office and consider cardiology referral  4. Polypharmacy   5. Strain of thoracic paraspinal muscles excluding T1 and T2 levels, initial encounter - liely due to cough -> heat and gentle stretching, zanaflex  6. Back muscle spasm - has not been able to refill flexeril due to ins formulary change - try zanaflex  7. Chronic cluster headache, not intractable - pt requests neurology referral - had complete w/u prev in [place] and was told she would need injections to relieve - HAs worsening so would like to consider injections  8. Cluster headache, not intractable, unspecified chronicity pattern   9. Actinic keratosis due to exposure to sunlight - pt is sure she has skin cancer and h/o sig sun exposure so req referral to Dr. Allyson Sabal for further eval  10. Vaginal bleeding - pt has been off her vaginal estradiol for > yr so is having severe vaginal dryness which she thinks is causing some spotting - refer to gyn for further eval  11. H/O hysterectomy with oophorectomy - pt unsure if she has her cervix    Orders Placed This Encounter  Procedures  . DG Chest 2 View    Standing Status: Future     Number of Occurrences: 1     Standing Expiration Date: 03/15/2016    Order Specific Question:  Reason for Exam (SYMPTOM  OR DIAGNOSIS REQUIRED)    Answer:  persistent cough, h/o LLL pneunomina    Order Specific Question:  Preferred imaging location?    Answer:  [facility]  . CT Chest Wo Contrast    Standing Status: Future     Number of Occurrences:      Standing Expiration Date: 05/15/2016    Scheduling Instructions:     Pt going to be gone on vacation from  around 8/6-8/13 so before would be great but  after if needed    Order Specific Question:  Reason for Exam (SYMPTOM  OR DIAGNOSIS REQUIRED)    Answer:  f/u lung abnml seen on abd CT with cont cough x 4 mos    Order Specific Question:  Preferred imaging location?    Answer:  [facility]  . Comprehensive metabolic panel  . TSH  . C-reactive protein  . Ambulatory referral to Neurology    Referral Priority:  Routine    Referral Type:  Consultation    Referral Reason:  Specialty Services Required    Requested Specialty:  Neurology    Number of Visits Requested:  1  . Ambulatory referral to Gynecology    Referral Priority:  Routine    Referral Type:  Consultation    Referral Reason:  Specialty Services Required    Requested Specialty:  Gynecology    Number of Visits Requested:  1  . Ambulatory referral to Dermatology    Referral Priority:  Routine    Referral Type:  Consultation    Referral Reason:  Specialty Services Required    Requested Specialty:  Dermatology    Number of Visits Requested:  1  . POCT CBC  . POCT SEDIMENTATION RATE  . EKG 12-Lead    Meds ordered this encounter  Medications  . ipratropium (ATROVENT) nebulizer solution 0.5 mg    Sig:   . levalbuterol (XOPENEX) nebulizer solution 0.63 mg    Sig:   . tiZANidine (ZANAFLEX) 4 MG capsule    Sig: Take 1 capsule (4 mg total) by mouth 3 (three) times daily as needed for muscle spasms.    Dispense:  60 capsule    Refill:  2  . azithromycin (ZITHROMAX) 500 MG tablet    Sig: Take 1 tablet (500 mg total) by mouth daily.    Dispense:  3 tablet    Refill:  0  . SUMAtriptan (IMITREX) 100 MG tablet    Sig: TAKE 1 TABLET BY MOUTH EVERY 2 HOURS AS NEEDED FOR MIGRAINE    Dispense:  10 tablet    Refill:  11  . Dextromethorphan-Guaifenesin (MUCINEX DM MAXIMUM STRENGTH) 60-1200 MG TB12    Sig: Take 1 tablet by mouth every 12 (twelve) hours.    Dispense:  28 each    Refill:  0  . benzonatate (TESSALON) 100 MG capsule      Sig: Take 1-2 capsules (100-200 mg total) by mouth 3 (three) times daily as needed for cough.    Dispense:  40 capsule    Refill:  0   Over 40 min spent in face-to-face evaluation of and consultation with patient and coordination of care.  Over 50% of this time was spent counseling this patient.   I personally performed the services described in this documentation, which was scribed in my presence. The recorded information has been reviewed and considered, and addended by me as needed.  [name], MD MPH

## 2015-03-17 ENCOUNTER — Telehealth: Payer: Self-pay

## 2015-03-17 LAB — COMPREHENSIVE METABOLIC PANEL
ALBUMIN: 4 g/dL (ref 3.6–5.1)
ALT: 28 U/L (ref 6–29)
AST: 24 U/L (ref 10–35)
Alkaline Phosphatase: 140 U/L — ABNORMAL HIGH (ref 33–130)
BUN: 13 mg/dL (ref 7–25)
CO2: 24 mmol/L (ref 20–31)
CREATININE: 0.86 mg/dL (ref 0.50–0.99)
Calcium: 9.1 mg/dL (ref 8.6–10.4)
Chloride: 104 mmol/L (ref 98–110)
Glucose, Bld: 139 mg/dL — ABNORMAL HIGH (ref 65–99)
POTASSIUM: 3.7 mmol/L (ref 3.5–5.3)
Sodium: 142 mmol/L (ref 135–146)
Total Bilirubin: 0.4 mg/dL (ref 0.2–1.2)
Total Protein: 6.6 g/dL (ref 6.1–8.1)

## 2015-03-17 LAB — C-REACTIVE PROTEIN: CRP: 0.5 mg/dL (ref <0.60)

## 2015-03-17 NOTE — Telephone Encounter (Signed)
-----   Message from Shawnee Knapp, MD sent at 03/16/2015  3:21 PM EDT ----- Pt stated she needs a letter for medicare stating she has a medical necessity for imitrex. She suffers from cluster HAs and has failed many things - I have referred her to neurology for further eval of her HAs - was last seen in [place], [place] sev yrs ago and unsure of what she has failed. States that she has her prior HA recs sent to [place] sev yrs ago.  Can you help me with this? Thanks, Harmon Pier

## 2015-03-17 NOTE — Telephone Encounter (Signed)
Thanks! Pamala Hurry saves the day again!

## 2015-03-17 NOTE — Telephone Encounter (Signed)
I called pharm because sumatriptan is normally covered, it is usually a matter of # of tablets allowed. Many ins only cover #9 / 30 days and the script was written for #10. I called pharm to have them try running for less and they advised that pt p/up yesterday, ins covered the #10 w/out a problem. Doesn't look like it is a problem after all.

## 2015-03-25 ENCOUNTER — Ambulatory Visit: Payer: Medicare Other | Admitting: Family Medicine

## 2015-03-28 ENCOUNTER — Other Ambulatory Visit: Payer: Self-pay | Admitting: Physician Assistant

## 2015-03-28 ENCOUNTER — Other Ambulatory Visit: Payer: Self-pay | Admitting: Family Medicine

## 2015-03-29 NOTE — Telephone Encounter (Signed)
Dr Brigitte Pulse, you just saw pt for check up for many issues, but don't see this med discussed. OK to RF?

## 2015-03-29 NOTE — Telephone Encounter (Signed)
Dr Brigitte Pulse, you just saw pt for check up for many issues, but don't see these meds discussed. OK to RF?

## 2015-03-31 ENCOUNTER — Ambulatory Visit
Admission: RE | Admit: 2015-03-31 | Discharge: 2015-03-31 | Disposition: A | Discharge status: Home / Self Care | Payer: Medicare Other | Source: Ambulatory Visit | Attending: Family Medicine | Admitting: Family Medicine

## 2015-03-31 DIAGNOSIS — R918 Other nonspecific abnormal finding of lung field: Secondary | ICD-10-CM | POA: Diagnosis not present

## 2015-03-31 DIAGNOSIS — R05 Cough: Secondary | ICD-10-CM

## 2015-03-31 DIAGNOSIS — R059 Cough, unspecified: Secondary | ICD-10-CM

## 2015-03-31 DIAGNOSIS — R000 Tachycardia, unspecified: Secondary | ICD-10-CM

## 2015-03-31 NOTE — Telephone Encounter (Signed)
Pt has appt w/ me on 9/1 so will refill to get to that pt - pt has not had a uric acid level checked so needs before further refills.

## 2015-03-31 NOTE — Telephone Encounter (Signed)
Pt has appt w/ me 9/1 so will refill x 1 to give pt time to get to visit

## 2015-04-04 ENCOUNTER — Other Ambulatory Visit: Payer: Self-pay | Admitting: Family Medicine

## 2015-04-05 NOTE — Telephone Encounter (Signed)
Pt has appt sch for 04/14/15. Dr Brigitte Pulse, do you want to give pt RF, or does pt need re-check?

## 2015-04-06 ENCOUNTER — Telehealth: Payer: Self-pay

## 2015-04-06 DIAGNOSIS — R059 Cough, unspecified: Secondary | ICD-10-CM

## 2015-04-06 DIAGNOSIS — R05 Cough: Secondary | ICD-10-CM

## 2015-04-06 MED ORDER — BENZONATATE 100 MG PO CAPS: 100.0000 mg | ORAL_CAPSULE | Freq: Three times a day (TID) | ORAL | Status: DC | PRN

## 2015-04-06 NOTE — Telephone Encounter (Signed)
Called patient and discussed CT scan findings. She reports she continues to smoke. Encouraged her to stop smoking. She continues to cough. Cough not interfering with her sleep. She gets relief with tessalon pearls and I agreed to send in more for her. She has an inhaler at home that she will try. She has a follow up appointment with Dr. Brigitte Pulse next week and will discuss further follow up.

## 2015-04-06 NOTE — Telephone Encounter (Signed)
Patient called requesting CT Chest results.  Will forward request to Tor Netters, [place] per request.

## 2015-04-06 NOTE — Telephone Encounter (Signed)
Attempted to call patient to discuss CT results. No answer. Will try again later.

## 2015-04-10 NOTE — Telephone Encounter (Signed)
Great, CT scan also sent to pt over mychart with a note from me. She has an appt with me in 4d to discuss further.

## 2015-04-14 ENCOUNTER — Ambulatory Visit (INDEPENDENT_AMBULATORY_CARE_PROVIDER_SITE_OTHER): Payer: Medicare Other | Admitting: Family Medicine

## 2015-04-14 ENCOUNTER — Encounter: Payer: Self-pay | Admitting: Family Medicine

## 2015-04-14 VITALS — BP 130/81 | HR 91 | Temp 98.9°F | Resp 16 | Ht 63.0 in | Wt 192.0 lb

## 2015-04-14 DIAGNOSIS — R059 Cough, unspecified: Secondary | ICD-10-CM

## 2015-04-14 DIAGNOSIS — F411 Generalized anxiety disorder: Secondary | ICD-10-CM | POA: Diagnosis not present

## 2015-04-14 DIAGNOSIS — G4700 Insomnia, unspecified: Secondary | ICD-10-CM | POA: Diagnosis not present

## 2015-04-14 DIAGNOSIS — R05 Cough: Secondary | ICD-10-CM

## 2015-04-14 DIAGNOSIS — M799 Soft tissue disorder, unspecified: Secondary | ICD-10-CM | POA: Diagnosis not present

## 2015-04-14 DIAGNOSIS — M109 Gout, unspecified: Secondary | ICD-10-CM | POA: Diagnosis not present

## 2015-04-14 DIAGNOSIS — R911 Solitary pulmonary nodule: Secondary | ICD-10-CM

## 2015-04-14 DIAGNOSIS — E785 Hyperlipidemia, unspecified: Secondary | ICD-10-CM | POA: Diagnosis not present

## 2015-04-14 DIAGNOSIS — Z720 Tobacco use: Secondary | ICD-10-CM

## 2015-04-14 DIAGNOSIS — R000 Tachycardia, unspecified: Secondary | ICD-10-CM

## 2015-04-14 DIAGNOSIS — I10 Essential (primary) hypertension: Secondary | ICD-10-CM

## 2015-04-14 DIAGNOSIS — IMO0001 Reserved for inherently not codable concepts without codable children: Secondary | ICD-10-CM

## 2015-04-14 DIAGNOSIS — M7989 Other specified soft tissue disorders: Secondary | ICD-10-CM

## 2015-04-14 DIAGNOSIS — Z23 Encounter for immunization: Secondary | ICD-10-CM

## 2015-04-14 MED ORDER — ALLOPURINOL 300 MG PO TABS: 300.0000 mg | ORAL_TABLET | Freq: Every day | ORAL | Status: DC

## 2015-04-14 MED ORDER — ZOLPIDEM TARTRATE 10 MG PO TABS: 10.0000 mg | ORAL_TABLET | Freq: Every evening | ORAL | Status: DC | PRN

## 2015-04-14 MED ORDER — BENZONATATE 100 MG PO CAPS: 100.0000 mg | ORAL_CAPSULE | Freq: Three times a day (TID) | ORAL | Status: DC | PRN

## 2015-04-14 MED ORDER — TRIAMCINOLONE ACETONIDE 55 MCG/ACT NA AERO: INHALATION_SPRAY | NASAL | Status: DC

## 2015-04-14 MED ORDER — CYCLOBENZAPRINE HCL 10 MG PO TABS: 10.0000 mg | ORAL_TABLET | Freq: Three times a day (TID) | ORAL | Status: DC | PRN

## 2015-04-14 MED ORDER — ALPRAZOLAM 0.5 MG PO TABS: ORAL_TABLET | ORAL | Status: DC

## 2015-04-14 MED ORDER — VALSARTAN-HYDROCHLOROTHIAZIDE 160-25 MG PO TABS: 1.0000 | ORAL_TABLET | Freq: Every day | ORAL | Status: DC

## 2015-04-14 MED ORDER — ESOMEPRAZOLE MAGNESIUM 20 MG PO CPDR: 20.0000 mg | DELAYED_RELEASE_CAPSULE | Freq: Two times a day (BID) | ORAL | Status: DC

## 2015-04-14 MED ORDER — ZOSTER VACCINE LIVE 19400 UNT/0.65ML ~~LOC~~ SOLR: 0.6500 mL | Freq: Once | SUBCUTANEOUS | Status: DC

## 2015-04-14 MED ORDER — DICLOFENAC SODIUM 75 MG PO TBEC: DELAYED_RELEASE_TABLET | ORAL | Status: DC

## 2015-04-14 NOTE — Progress Notes (Signed)
Subjective:    Patient ID: Melinda Peterson, female    DOB: 07-13-1951, 64 y.o.   MRN: [id number]  Chief Complaint  Patient presents with  . Follow-up  . Hypertension    HPI Had a fall so hit left medial ankle and left first metatarsal - she hit a pole and fell back - has been wearing brace, icing, and has had some improvement but did have swelling.  Is ok to wait for another 2 wks   Pt had a severe back pain from coughing but both the back pain and the cough have slowly gotten better since her visit sev wks ago.  zanaflex is helping but not as much   Has not been checking HR outside office as she was requested to do.  Requests cardiology referral for chronic tachycardia.  Has an appt with neurology to review her chronic HAs   Past Medical History  Diagnosis Date  . Allergy   . Anxiety   . Eczema   . Irritable bowel syndrome   . Osteopenia   . Hyperlipidemia   . Arthritis   . Depression   . Neuromuscular disorder   . Osteoporosis   . Chronic headaches   . Colon polyps   . Diverticulosis   . GERD (gastroesophageal reflux disease)   . HTN (hypertension)   . Cataract    Current Outpatient Prescriptions on File Prior to Visit  Medication Sig Dispense Refill  . albuterol (PROVENTIL HFA;VENTOLIN HFA) 108 (90 BASE) MCG/ACT inhaler Inhale 2 puffs into the lungs every 4 (four) hours as needed for wheezing or shortness of breath (cough, shortness of breath or wheezing.). 1 Inhaler 1  . aspirin 81 MG tablet Take 81 mg by mouth daily.    Marland Kitchen atorvastatin (LIPITOR) 20 MG tablet TAKE 1 TABLET BY MOUTH AT BEDTIME 30 tablet 5  . Dextromethorphan-Guaifenesin (MUCINEX DM MAXIMUM STRENGTH) 60-1200 MG TB12 Take 1 tablet by mouth every 12 (twelve) hours. 28 each 0  . FLUoxetine (PROZAC) 40 MG capsule TAKE 1 CAPSULE(40 MG) BY MOUTH DAILY 30 capsule 5  . ipratropium (ATROVENT HFA) 17 MCG/ACT inhaler Inhale 2 puffs into the lungs every 6 (six) hours. 1 Inhaler 5  . Melatonin 10 MG CAPS Take 10 mg  by mouth at bedtime.    . Multiple Vitamin (MULTIVITAMIN) capsule Take 1 capsule by mouth daily.    Marland Kitchen OVER THE COUNTER MEDICATION OTC Vitamin D 3 2000 mg daily    . Spacer/Aero-Holding Chambers (AEROCHAMBER PLUS FLO-VU MEDIUM) MISC 1 each by Other route once. 1 each 0  . SUMAtriptan (IMITREX) 100 MG tablet TAKE 1 TABLET BY MOUTH EVERY 2 HOURS AS NEEDED FOR MIGRAINE 10 tablet 11  . triamcinolone cream (KENALOG) 0.1 % Apply 1 application topically 2 (two) times daily. 30 g 0  . Blood Pressure Monitoring (BLOOD PRESSURE MONITOR/WRIST) DEVI 1 Device by Does not apply route daily. (Patient not taking: Reported on 04/14/2015) 1 Device 0   No current facility-administered medications on file prior to visit.   Allergies  Allergen Reactions  . Biaxin [Clarithromycin]   . Clarithromycin Hives  . Lincomycin Hcl Hives  . Lisinopril Swelling     Depression screen [facility] 2/9 04/14/2015 03/16/2015 12/31/2014 01/13/2014  Decreased Interest 0 0 0 0  Down, Depressed, Hopeless 0 0 0 0  PHQ - 2 Score 0 0 0 0      Review of Systems  Constitutional: Positive for fatigue. Negative for fever, chills, activity change, appetite change and unexpected weight  change.  Respiratory: Positive for cough and chest tightness. Negative for shortness of breath and wheezing.   Cardiovascular: Positive for chest pain. Negative for palpitations.  Genitourinary: Positive for vaginal bleeding, genital sores and vaginal pain. Negative for dysuria, hematuria, vaginal discharge, difficulty urinating, menstrual problem and pelvic pain.  Musculoskeletal: Positive for myalgias, back pain, joint swelling, arthralgias and gait problem.  Skin: Positive for rash.  Neurological: Positive for headaches.  Psychiatric/Behavioral: Negative for dysphoric mood. The patient is not nervous/anxious.        Objective:   Physical Exam  Constitutional: She is oriented to person, place, and time. She appears well-developed and well-nourished. No  distress.  HENT:  Head: Normocephalic and atraumatic.  Right Ear: External ear normal.  Left Ear: External ear normal.  Eyes: Conjunctivae are normal. No scleral icterus.  Neck: Normal range of motion. Neck supple. No thyromegaly present.  Cardiovascular: Normal rate, regular rhythm, normal heart sounds and intact distal pulses.   Pulmonary/Chest: Effort normal and breath sounds normal. No respiratory distress.  Musculoskeletal: She exhibits no edema.  Lymphadenopathy:    She has no cervical adenopathy.  Neurological: She is alert and oriented to person, place, and time.  Skin: Skin is warm and dry. She is not diaphoretic. No erythema.  Psychiatric: She has a normal mood and affect. Her behavior is normal.          Assessment & Plan:   1. Need for shingles vaccine   2. Cough - finally improving  3. Tachycardia - refer to cardiology for this per pt req - has had longstanding sinus tach whenever in office so could be white coat - reminded pt to check HR at home  4. Hyperlipidemia, mild   5. Tobacco user - pt contemplative for quitting - rec call [phone]  6. Gout of right foot, unspecified cause, unspecified chronicity   7. Essential hypertension   8. Soft tissue masses - suspect lipoma but could be cyst - watchful waiting and if enlarging or painful can cons [place] vs MRI for diagnosis.  9. Insomnia   10. Anxiety state   11.    Lung nodule - small but + tob use so recheck chest ct scan in 1 yr  Orders Placed This Encounter  Procedures  . Ambulatory referral to Cardiology    Referral Priority:  Routine    Referral Type:  Consultation    Referral Reason:  Specialty Services Required    Requested Specialty:  Cardiology    Number of Visits Requested:  1    Meds ordered this encounter  Medications  . zoster vaccine live, PF, (ZOSTAVAX) 92330 UNT/0.65ML injection    Sig: Inject 19,400 Units into the skin once.    Dispense:  1 each    Refill:  0  . cyclobenzaprine (FLEXERIL)  10 MG tablet    Sig: Take 1 tablet (10 mg total) by mouth 3 (three) times daily as needed for muscle spasms.    Dispense:  30 tablet    Refill:  3  . benzonatate (TESSALON) 100 MG capsule    Sig: Take 1-2 capsules (100-200 mg total) by mouth 3 (three) times daily as needed for cough.    Dispense:  60 capsule    Refill:  0  . ALPRAZolam (XANAX) 0.5 MG tablet    Sig: Take 1 tablet by mouth 3 times day prn anxiety.    Dispense:  90 tablet    Refill:  3    Not to exceed 3  additional fills before 01/14/2015  . allopurinol (ZYLOPRIM) 300 MG tablet    Sig: Take 1 tablet (300 mg total) by mouth daily.    Dispense:  90 tablet    Refill:  1  . diclofenac (VOLTAREN) 75 MG EC tablet    Sig: TAKE 1 TABLET (75 MG TOTAL) BY MOUTH 2 (TWO) TIMES DAILY. TAKE WITH FOOD.    Dispense:  60 tablet    Refill:  3  . esomeprazole (NEXIUM) 20 MG capsule    Sig: Take 1 capsule (20 mg total) by mouth 2 (two) times daily before a meal.    Dispense:  180 capsule    Refill:  1  . triamcinolone (NASACORT) 55 MCG/ACT AERO nasal inhaler    Sig: PLACE 2 SPRAYS INTO THE NOSE DAILY.    Dispense:  1 Inhaler    Refill:  11  . valsartan-hydrochlorothiazide (DIOVAN-HCT) 160-25 MG per tablet    Sig: Take 1 tablet by mouth daily.    Dispense:  90 tablet    Refill:  3  . zolpidem (AMBIEN) 10 MG tablet    Sig: Take 1 tablet (10 mg total) by mouth at bedtime as needed. for sleep    Dispense:  30 tablet    Refill:  3   Over 40 min spent in face-to-face evaluation of and consultation with patient and coordination of care.  Over 50% of this time was spent counseling this patient.   [name], MD MPH

## 2015-04-14 NOTE — Patient Instructions (Signed)
Smoking Cessation, Tips for Success  If you are ready to quit smoking, congratulations! You have chosen to help yourself be healthier. Cigarettes bring nicotine, tar, carbon monoxide, and other irritants into your body. Your lungs, heart, and blood vessels will be able to work better without these poisons. There are many different ways to quit smoking. Nicotine gum, nicotine patches, a nicotine inhaler, or nicotine nasal spray can help with physical craving. Hypnosis, support groups, and medicines help break the habit of smoking.  WHAT THINGS CAN I DO TO MAKE QUITTING EASIER?   Here are some tips to help you quit for good:  · Pick a date when you will quit smoking completely. Tell all of your friends and family about your plan to quit on that date.  · Do not try to slowly cut down on the number of cigarettes you are smoking. Pick a quit date and quit smoking completely starting on that day.  · Throw away all cigarettes.    · Clean and remove all ashtrays from your home, work, and car.  · On a card, write down your reasons for quitting. Carry the card with you and read it when you get the urge to smoke.  · Cleanse your body of nicotine. Drink enough water and fluids to keep your urine clear or pale yellow. Do this after quitting to flush the nicotine from your body.  · Learn to predict your moods. Do not let a bad situation be your excuse to have a cigarette. Some situations in your life might tempt you into wanting a cigarette.  · Never have "just one" cigarette. It leads to wanting another and another. Remind yourself of your decision to quit.  · Change habits associated with smoking. If you smoked while driving or when feeling stressed, try other activities to replace smoking. Stand up when drinking your coffee. Brush your teeth after eating. Sit in a different chair when you read the paper. Avoid alcohol while trying to quit, and try to drink fewer caffeinated beverages. Alcohol and caffeine may urge you to  smoke.  · Avoid foods and drinks that can trigger a desire to smoke, such as sugary or spicy foods and alcohol.  · Ask people who smoke not to smoke around you.  · Have something planned to do right after eating or having a cup of coffee. For example, plan to take a walk or exercise.  · Try a relaxation exercise to calm you down and decrease your stress. Remember, you may be tense and nervous for the first 2 weeks after you quit, but this will pass.  · Find new activities to keep your hands busy. Play with a pen, coin, or rubber band. Doodle or draw things on paper.  · Brush your teeth right after eating. This will help cut down on the craving for the taste of tobacco after meals. You can also try mouthwash.    · Use oral substitutes in place of cigarettes. Try using lemon drops, carrots, cinnamon sticks, or chewing gum. Keep them handy so they are available when you have the urge to smoke.  · When you have the urge to smoke, try deep breathing.  · Designate your home as a nonsmoking area.  · If you are a heavy smoker, ask your health care provider about a prescription for nicotine chewing gum. It can ease your withdrawal from nicotine.  · Reward yourself. Set aside the cigarette money you save and buy yourself something nice.  · Look for   support from others. Join a support group or smoking cessation program. Ask someone at home or at work to help you with your plan to quit smoking.  · Always ask yourself, "Do I need this cigarette or is this just a reflex?" Tell yourself, "Today, I choose not to smoke," or "I do not want to smoke." You are reminding yourself of your decision to quit.  · Do not replace cigarette smoking with electronic cigarettes (commonly called e-cigarettes). The safety of e-cigarettes is unknown, and some may contain harmful chemicals.  · If you relapse, do not give up! Plan ahead and think about what you will do the next time you get the urge to smoke.  HOW WILL I FEEL WHEN I QUIT SMOKING?  You  may have symptoms of withdrawal because your body is used to nicotine (the addictive substance in cigarettes). You may crave cigarettes, be irritable, feel very hungry, cough often, get headaches, or have difficulty concentrating. The withdrawal symptoms are only temporary. They are strongest when you first quit but will go away within 10-14 days. When withdrawal symptoms occur, stay in control. Think about your reasons for quitting. Remind yourself that these are signs that your body is healing and getting used to being without cigarettes. Remember that withdrawal symptoms are easier to treat than the major diseases that smoking can cause.   Even after the withdrawal is over, expect periodic urges to smoke. However, these cravings are generally short lived and will go away whether you smoke or not. Do not smoke!  WHAT RESOURCES ARE AVAILABLE TO HELP ME QUIT SMOKING?  Your health care provider can direct you to community resources or hospitals for support, which may include:  · Group support.  · Education.  · Hypnosis.  · Therapy.  Document Released: 04/27/2004 Document Revised: 12/14/2013 Document Reviewed: 01/15/2013  Exit[employer]® Patient Information ©2015 [employer]. This information is not intended to replace advice given to you by your health care provider. Make sure you discuss any questions you have with your health care provider.

## 2015-04-15 ENCOUNTER — Ambulatory Visit: Payer: Medicare Other | Admitting: Family Medicine

## 2015-04-27 ENCOUNTER — Other Ambulatory Visit: Payer: Self-pay | Admitting: Family Medicine

## 2015-04-28 ENCOUNTER — Ambulatory Visit: Payer: Medicare Other | Admitting: Neurology

## 2015-05-19 ENCOUNTER — Other Ambulatory Visit: Payer: Self-pay | Admitting: Physician Assistant

## 2015-05-30 ENCOUNTER — Encounter: Payer: Self-pay | Admitting: Cardiology

## 2015-05-30 ENCOUNTER — Ambulatory Visit (INDEPENDENT_AMBULATORY_CARE_PROVIDER_SITE_OTHER): Payer: Medicare Other | Admitting: Cardiology

## 2015-05-30 VITALS — BP 132/78 | HR 113 | Ht 64.0 in | Wt 191.0 lb

## 2015-05-30 DIAGNOSIS — F419 Anxiety disorder, unspecified: Secondary | ICD-10-CM

## 2015-05-30 DIAGNOSIS — R9431 Abnormal electrocardiogram [ECG] [EKG]: Secondary | ICD-10-CM

## 2015-05-30 DIAGNOSIS — Z720 Tobacco use: Secondary | ICD-10-CM | POA: Diagnosis not present

## 2015-05-30 DIAGNOSIS — R000 Tachycardia, unspecified: Secondary | ICD-10-CM | POA: Diagnosis not present

## 2015-05-30 DIAGNOSIS — I10 Essential (primary) hypertension: Secondary | ICD-10-CM
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
## 2015-05-30 NOTE — Patient Instructions (Signed)
Medication Instructions:  The current medical regimen is effective;  continue present plan and medications.  Testing/Procedures: Your physician has recommended that you wear a holter monitor. Holter monitors are medical devices that record the heart's electrical activity. Doctors most often use these monitors to diagnose arrhythmias. Arrhythmias are problems with the speed or rhythm of the heartbeat. The monitor is a small, portable device. You can wear one while you do your normal daily activities. This is usually used to diagnose what is causing palpitations/syncope (passing out).  Your physician has requested that you have an echocardiogram. Echocardiography is a painless test that uses sound waves to create images of your heart. It provides your doctor with information about the size and shape of your heart and how well your heart's chambers and valves are working. This procedure takes approximately one hour. There are no restrictions for this procedure.  Follow-Up: Follow up as needed after testing.  Thank you for choosing [facility]!!

## 2015-06-03 ENCOUNTER — Ambulatory Visit (INDEPENDENT_AMBULATORY_CARE_PROVIDER_SITE_OTHER): Payer: Medicare Other

## 2015-06-03 ENCOUNTER — Other Ambulatory Visit: Payer: Self-pay

## 2015-06-03 ENCOUNTER — Ambulatory Visit (HOSPITAL_COMMUNITY): Payer: Medicare Other | Attending: Cardiovascular Disease

## 2015-06-03 DIAGNOSIS — E785 Hyperlipidemia, unspecified: Secondary | ICD-10-CM | POA: Diagnosis not present

## 2015-06-03 DIAGNOSIS — R9431 Abnormal electrocardiogram [ECG] [EKG]: Secondary | ICD-10-CM | POA: Diagnosis not present

## 2015-06-03 DIAGNOSIS — I517 Cardiomegaly: Secondary | ICD-10-CM | POA: Diagnosis not present

## 2015-06-03 DIAGNOSIS — Z8249 Family history of ischemic heart disease and other diseases of the circulatory system: Secondary | ICD-10-CM | POA: Diagnosis not present

## 2015-06-03 DIAGNOSIS — Z87891 Personal history of nicotine dependence: Secondary | ICD-10-CM | POA: Diagnosis not present

## 2015-06-03 DIAGNOSIS — R000 Tachycardia, unspecified: Secondary | ICD-10-CM

## 2015-06-03 DIAGNOSIS — I10 Essential (primary) hypertension: Secondary | ICD-10-CM | POA: Diagnosis not present

## 2015-06-11 ENCOUNTER — Telehealth: Payer: Self-pay | Admitting: Family Medicine

## 2015-06-11 NOTE — Telephone Encounter (Signed)
lmom to call and reschedule her appt that she had with Brigitte Pulse on 08/18/15 For a CPE

## 2015-06-27 ENCOUNTER — Other Ambulatory Visit: Payer: Self-pay

## 2015-06-27 MED ORDER — DICLOFENAC SODIUM 75 MG PO TBEC: DELAYED_RELEASE_TABLET | ORAL | Status: DC

## 2015-07-19 ENCOUNTER — Other Ambulatory Visit: Payer: Self-pay | Admitting: Family Medicine

## 2015-07-23 ENCOUNTER — Other Ambulatory Visit: Payer: Self-pay | Admitting: Physician Assistant

## 2015-07-25 ENCOUNTER — Other Ambulatory Visit: Payer: Self-pay | Admitting: Physician Assistant

## 2015-08-18 ENCOUNTER — Encounter: Payer: Medicare Other | Admitting: Family Medicine

## 2015-08-25 ENCOUNTER — Other Ambulatory Visit: Payer: Self-pay | Admitting: Family Medicine

## 2015-08-28 ENCOUNTER — Other Ambulatory Visit: Payer: Self-pay | Admitting: Family Medicine

## 2015-09-01 NOTE — Telephone Encounter (Signed)
Called in Rx and asked them to notify pt when ready.

## 2015-09-01 NOTE — Telephone Encounter (Signed)
PT req refill for zolpidem (AMBIEN) 10 MG tablet [id number] and ALPRAZolam (XANAX) 0.5 MG tablet [[id number]]/// She states she had an OV scheduled on January 2017 but [facility] canceled/rescheduled to 09/22/15 @ 2:15pm// pt states she has be out of rx and phrm. Has been req. This for over a week.  Please call to advise

## 2015-09-22 ENCOUNTER — Ambulatory Visit (INDEPENDENT_AMBULATORY_CARE_PROVIDER_SITE_OTHER): Payer: Medicare Other | Admitting: Family Medicine

## 2015-09-22 ENCOUNTER — Encounter: Payer: Self-pay | Admitting: Family Medicine

## 2015-09-22 VITALS — BP 119/81 | HR 103 | Temp 98.2°F | Resp 18 | Ht 64.0 in | Wt 189.0 lb

## 2015-09-22 DIAGNOSIS — Z1212 Encounter for screening for malignant neoplasm of rectum: Secondary | ICD-10-CM

## 2015-09-22 DIAGNOSIS — Z1239 Encounter for other screening for malignant neoplasm of breast: Secondary | ICD-10-CM | POA: Diagnosis not present

## 2015-09-22 DIAGNOSIS — Z1389 Encounter for screening for other disorder: Secondary | ICD-10-CM | POA: Diagnosis not present

## 2015-09-22 DIAGNOSIS — Z0000 Encounter for general adult medical examination without abnormal findings: Secondary | ICD-10-CM | POA: Diagnosis not present

## 2015-09-22 DIAGNOSIS — Z113 Encounter for screening for infections with a predominantly sexual mode of transmission: Secondary | ICD-10-CM

## 2015-09-22 DIAGNOSIS — Z136 Encounter for screening for cardiovascular disorders: Secondary | ICD-10-CM

## 2015-09-22 DIAGNOSIS — Z1211 Encounter for screening for malignant neoplasm of colon: Secondary | ICD-10-CM

## 2015-09-22 DIAGNOSIS — Z23 Encounter for immunization: Secondary | ICD-10-CM | POA: Diagnosis not present

## 2015-09-22 DIAGNOSIS — D239 Other benign neoplasm of skin, unspecified: Secondary | ICD-10-CM

## 2015-09-22 DIAGNOSIS — E785 Hyperlipidemia, unspecified: Secondary | ICD-10-CM | POA: Diagnosis not present

## 2015-09-22 DIAGNOSIS — Z1383 Encounter for screening for respiratory disorder NEC: Secondary | ICD-10-CM

## 2015-09-22 DIAGNOSIS — Z1329 Encounter for screening for other suspected endocrine disorder: Secondary | ICD-10-CM | POA: Diagnosis not present

## 2015-09-22 DIAGNOSIS — Z130 Encounter for screening for diseases of the blood and blood-forming organs and certain disorders involving the immune mechanism: Secondary | ICD-10-CM

## 2015-09-22 DIAGNOSIS — E669 Obesity, unspecified: Secondary | ICD-10-CM

## 2015-09-22 DIAGNOSIS — R7309 Other abnormal glucose: Secondary | ICD-10-CM

## 2015-09-22 DIAGNOSIS — I10 Essential (primary) hypertension: Secondary | ICD-10-CM

## 2015-09-22 DIAGNOSIS — D229 Melanocytic nevi, unspecified: Secondary | ICD-10-CM

## 2015-09-22 LAB — POCT URINALYSIS DIP (MANUAL ENTRY)
BILIRUBIN UA: NEGATIVE
GLUCOSE UA: NEGATIVE
Ketones, POC UA: NEGATIVE
LEUKOCYTES UA: NEGATIVE
NITRITE UA: NEGATIVE
Protein Ur, POC: NEGATIVE
RBC UA: NEGATIVE
Spec Grav, UA: 1.025
Urobilinogen, UA: 0.2
pH, UA: 6

## 2015-09-22 MED ORDER — ESTROGENS, CONJUGATED 0.625 MG/GM VA CREA: 1.0000 | TOPICAL_CREAM | Freq: Every day | VAGINAL | Status: DC

## 2015-09-22 MED ORDER — ALPRAZOLAM 0.5 MG PO TABS: 0.5000 mg | ORAL_TABLET | Freq: Three times a day (TID) | ORAL | Status: DC | PRN

## 2015-09-22 MED ORDER — MOMETASONE FUROATE 50 MCG/ACT NA SUSP: 2.0000 | Freq: Every day | NASAL | Status: DC

## 2015-09-22 MED ORDER — ZOLPIDEM TARTRATE 10 MG PO TABS: ORAL_TABLET | ORAL | Status: DC
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
## 2015-09-22 NOTE — Patient Instructions (Addendum)
[facility] is now offering annual lung cancer screening by low-dose CT scan.  This is covered for qualifying patients and your insurance will be checked before the procedure.  Call the lung cancer screening nurse navigators at [phone] to learn more about this and get scheduled.  Remember that you will need to come back in 6 months for refills on your xanax and ambien. [facility] Policy for Prescribing Controlled Substances (Revised 06/2012) 1. Prescriptions for controlled substances will be filled by ONE provider at [facility] with whom you have established and developed a plan for your care, including follow-up. 2. You are encouraged to schedule an appointment with your prescriber at our appointment center for follow-up visits whenever possible. 3. If you request a prescription for the controlled substance while at [facility] for an acute problem (with someone other than your regular prescriber), you MAY be given a ONE-TIME prescription for a 30-day supply of the controlled substance, to allow time for you to return to see your regular prescriber for additional prescriptions.   Melinda Peterson , Thank you for taking time to come for your Medicare Wellness Visit. I appreciate your ongoing commitment to your health goals. Please review the following plan we discussed and let me know if I can assist you in the future.   These are the goals we discussed: Goals    None      This is a list of the screening recommended for you and due dates:  Health Maintenance  Topic Date Due  . Mammogram  10/05/1999  . DEXA scan (bone density measurement)  10/04/2014  . Pneumonia vaccines (1 of 2 - PCV13) 10/04/2014  . Pap Smear  05/01/2015  . Colon Cancer Screening  09/16/2015  . Flu Shot  11/11/2015*  . Tetanus Vaccine  01/14/2024  . Shingles Vaccine  Completed  .  Hepatitis C: One time screening is recommended by Center for Disease Control  (CDC) for  adults born from [birth year] through 1966.   Completed  . HIV Screening   Completed  *Topic was postponed. The date shown is not the original due date.   Menopause is a normal process in which your reproductive ability comes to an end. This process happens gradually over a span of months to years, usually between the ages of 74 and 98. Menopause is complete when you have missed 12 consecutive menstrual periods. It is important to talk with your health care provider about some of the most common conditions that affect postmenopausal women, such as heart disease, cancer, and bone loss (osteoporosis). Adopting a healthy lifestyle and getting preventive care can help to promote your health and wellness. Those actions can also lower your chances of developing some of these common conditions. WHAT SHOULD I KNOW ABOUT MENOPAUSE? During menopause, you may experience a number of symptoms, such as:  Moderate-to-severe hot flashes.  Night sweats.  Decrease in sex drive.  Mood swings.  Headaches.  Tiredness.  Irritability.  Memory problems.  Insomnia. Choosing to treat or not to treat menopausal changes is an individual decision that you make with your health care provider. WHAT SHOULD I KNOW ABOUT HORMONE REPLACEMENT THERAPY AND SUPPLEMENTS? Hormone therapy products are effective for treating symptoms that are associated with menopause, such as hot flashes and night sweats. Hormone replacement carries certain risks, especially as you become older. If you are thinking about using estrogen or estrogen with progestin treatments, discuss the benefits and risks with your health care provider. WHAT SHOULD I KNOW ABOUT HEART DISEASE  AND STROKE? Heart disease, heart attack, and stroke become more likely as you age. This may be due, in part, to the hormonal changes that your body experiences during menopause. These can affect how your body processes dietary fats, triglycerides, and cholesterol. Heart attack and stroke are both medical emergencies. There are many things that you  can do to help prevent heart disease and stroke:  Have your blood pressure checked at least every 1-2 years. High blood pressure causes heart disease and increases the risk of stroke.  If you are 70-76 years old, ask your health care provider if you should take aspirin to prevent a heart attack or a stroke.  Do not use any tobacco products, including cigarettes, chewing tobacco, or electronic cigarettes. If you need help quitting, ask your health care provider.  It is important to eat a healthy diet and maintain a healthy weight.  Be sure to include plenty of vegetables, fruits, low-fat dairy products, and lean protein.  Avoid eating foods that are high in solid fats, added sugars, or salt (sodium).  Get regular exercise. This is one of the most important things that you can do for your health.  Try to exercise for at least 150 minutes each week. The type of exercise that you do should increase your heart rate and make you sweat. This is known as moderate-intensity exercise.  Try to do strengthening exercises at least twice each week. Do these in addition to the moderate-intensity exercise.  Know your numbers.Ask your health care provider to check your cholesterol and your blood glucose. Continue to have your blood tested as directed by your health care provider. WHAT SHOULD I KNOW ABOUT CANCER SCREENING? There are several types of cancer. Take the following steps to reduce your risk and to catch any cancer development as early as possible. Breast Cancer  Practice breast self-awareness.  This means understanding how your breasts normally appear and feel.  It also means doing regular breast self-exams. Let your health care provider know about any changes, no matter how small.  If you are 4 or older, have a clinician do a breast exam (clinical breast exam or CBE) every year. Depending on your age, family history, and medical history, it may be recommended that you also have a yearly  breast X-ray (mammogram).  If you have a family history of breast cancer, talk with your health care provider about genetic screening.  If you are at high risk for breast cancer, talk with your health care provider about having an MRI and a mammogram every year.  Breast cancer (BRCA) gene test is recommended for women who have family members with BRCA-related cancers. Results of the assessment will determine the need for genetic counseling and BRCA1 and for BRCA2 testing. BRCA-related cancers include these types:  Breast. This occurs in males or females.  Ovarian.  Tubal. This may also be called fallopian tube cancer.  Cancer of the abdominal or pelvic lining (peritoneal cancer).  Prostate.  Pancreatic. Cervical, Uterine, and Ovarian Cancer Your health care provider may recommend that you be screened regularly for cancer of the pelvic organs. These include your ovaries, uterus, and vagina. This screening involves a pelvic exam, which includes checking for microscopic changes to the surface of your cervix (Pap test).  For women ages 21-65, health care providers may recommend a pelvic exam and a Pap test every three years. For women ages 26-65, they may recommend the Pap test and pelvic exam, combined with testing for human papilloma  virus (HPV), every five years. Some types of HPV increase your risk of cervical cancer. Testing for HPV may also be done on women of any age who have unclear Pap test results.  Other health care providers may not recommend any screening for nonpregnant women who are considered low risk for pelvic cancer and have no symptoms. Ask your health care provider if a screening pelvic exam is right for you.  If you have had past treatment for cervical cancer or a condition that could lead to cancer, you need Pap tests and screening for cancer for at least 20 years after your treatment. If Pap tests have been discontinued for you, your risk factors (such as having a new  sexual partner) need to be reassessed to determine if you should start having screenings again. Some women have medical problems that increase the chance of getting cervical cancer. In these cases, your health care provider may recommend that you have screening and Pap tests more often.  If you have a family history of uterine cancer or ovarian cancer, talk with your health care provider about genetic screening.  If you have vaginal bleeding after reaching menopause, tell your health care provider.  There are currently no reliable tests available to screen for ovarian cancer. Lung Cancer Lung cancer screening is recommended for adults 11-42 years old who are at high risk for lung cancer because of a history of smoking. A yearly low-dose CT scan of the lungs is recommended if you:  Currently smoke.  Have a history of at least 30 pack-years of smoking and you currently smoke or have quit within the past 15 years. A pack-year is smoking an average of one pack of cigarettes per day for one year. Yearly screening should:  Continue until it has been 15 years since you quit.  Stop if you develop a health problem that would prevent you from having lung cancer treatment. Colorectal Cancer  This type of cancer can be detected and can often be prevented.  Routine colorectal cancer screening usually begins at age 17 and continues through age 81.  If you have risk factors for colon cancer, your health care provider may recommend that you be screened at an earlier age.  If you have a family history of colorectal cancer, talk with your health care provider about genetic screening.  Your health care provider may also recommend using home test kits to check for hidden blood in your stool.  A small camera at the end of a tube can be used to examine your colon directly (sigmoidoscopy or colonoscopy). This is done to check for the earliest forms of colorectal cancer.  Direct examination of the colon  should be repeated every 5-10 years until age 49. However, if early forms of precancerous polyps or small growths are found or if you have a family history or genetic risk for colorectal cancer, you may need to be screened more often. Skin Cancer  Check your skin from head to toe regularly.  Monitor any moles. Be sure to tell your health care provider:  About any new moles or changes in moles, especially if there is a change in a mole's shape or color.  If you have a mole that is larger than the size of a pencil eraser.  If any of your family members has a history of skin cancer, especially at a young age, talk with your health care provider about genetic screening.  Always use sunscreen. Apply sunscreen liberally and repeatedly throughout  the day.  Whenever you are outside, protect yourself by wearing long sleeves, pants, a wide-brimmed hat, and sunglasses. WHAT SHOULD I KNOW ABOUT OSTEOPOROSIS? Osteoporosis is a condition in which bone destruction happens more quickly than new bone creation. After menopause, you may be at an increased risk for osteoporosis. To help prevent osteoporosis or the bone fractures that can happen because of osteoporosis, the following is recommended:  If you are 92-15 years old, get at least 1,000 mg of calcium and at least 600 mg of vitamin D per day.  If you are older than age 49 but younger than age 48, get at least 1,200 mg of calcium and at least 600 mg of vitamin D per day.  If you are older than age 40, get at least 1,200 mg of calcium and at least 800 mg of vitamin D per day. Smoking and excessive alcohol intake increase the risk of osteoporosis. Eat foods that are rich in calcium and vitamin D, and do weight-bearing exercises several times each week as directed by your health care provider. WHAT SHOULD I KNOW ABOUT HOW MENOPAUSE AFFECTS [place]? Depression may occur at any age, but it is more common as you become older. Common symptoms of  depression include:  Low or sad mood.  Changes in sleep patterns.  Changes in appetite or eating patterns.  Feeling an overall lack of motivation or enjoyment of activities that you previously enjoyed.  Frequent crying spells. Talk with your health care provider if you think that you are experiencing depression. WHAT SHOULD I KNOW ABOUT IMMUNIZATIONS? It is important that you get and maintain your immunizations. These include:  Tetanus, diphtheria, and pertussis (Tdap) booster vaccine.  Influenza every year before the flu season begins.  Pneumonia vaccine.  Shingles vaccine. Your health care provider may also recommend other immunizations.   This information is not intended to replace advice given to you by your health care provider. Make sure you discuss any questions you have with your health care provider.   Document Released: 09/21/2005 Document Revised: 08/20/2014 Document Reviewed: 04/01/2014 Elsevier Interactive Patient Education [employer].

## 2015-09-23 ENCOUNTER — Encounter: Payer: Self-pay | Admitting: Family Medicine

## 2015-09-23 DIAGNOSIS — E8881 Metabolic syndrome: Secondary | ICD-10-CM | POA: Insufficient documentation

## 2015-09-23 DIAGNOSIS — R7303 Prediabetes: Secondary | ICD-10-CM | POA: Insufficient documentation

## 2015-09-23 LAB — COMPREHENSIVE METABOLIC PANEL
ALBUMIN: 4 g/dL (ref 3.6–5.1)
ALT: 26 U/L (ref 6–29)
AST: 20 U/L (ref 10–35)
Alkaline Phosphatase: 129 U/L (ref 33–130)
BILIRUBIN TOTAL: 0.6 mg/dL (ref 0.2–1.2)
BUN: 16 mg/dL (ref 7–25)
CO2: 24 mmol/L (ref 20–31)
CREATININE: 0.73 mg/dL (ref 0.50–0.99)
Calcium: 9.3 mg/dL (ref 8.6–10.4)
Chloride: 102 mmol/L (ref 98–110)
GLUCOSE: 99 mg/dL (ref 65–99)
Potassium: 3.2 mmol/L — ABNORMAL LOW (ref 3.5–5.3)
SODIUM: 140 mmol/L (ref 135–146)
Total Protein: 6.7 g/dL (ref 6.1–8.1)

## 2015-09-23 LAB — LIPID PANEL
Cholesterol: 222 mg/dL — ABNORMAL HIGH (ref 125–200)
HDL: 47 mg/dL (ref >=46)
TRIGLYCERIDES: 434 mg/dL — AB (ref <150)
Total CHOL/HDL Ratio: 4.7 Ratio (ref <=5.0)

## 2015-09-23 LAB — HEMOGLOBIN A1C
HEMOGLOBIN A1C: 5.9 % — AB (ref <5.7)
MEAN PLASMA GLUCOSE: 123 mg/dL — AB (ref <117)

## 2015-09-23 LAB — HEPATITIS C ANTIBODY: HCV AB: NEGATIVE

## 2015-09-28 ENCOUNTER — Telehealth: Payer: Self-pay | Admitting: Gastroenterology

## 2015-09-28 NOTE — Telephone Encounter (Signed)
Dr. Danis reviewed records and has accepted patient. Ok to schedule Direct Colon. Left message for patient to return my call.  °

## 2015-10-10 NOTE — Addendum Note (Signed)
Addended by: [name] on: 10/10/2015 10:58 PM   Modules accepted: Orders, Medications, SmartSet

## 2015-10-19 IMAGING — CR DG CHEST 2V
2 series · 2 of 2 positions shown · non-contrast
Comparison: January 03, 2015

CLINICAL DATA: Chest pain

EXAM:
CHEST  2 VIEW

[PA]
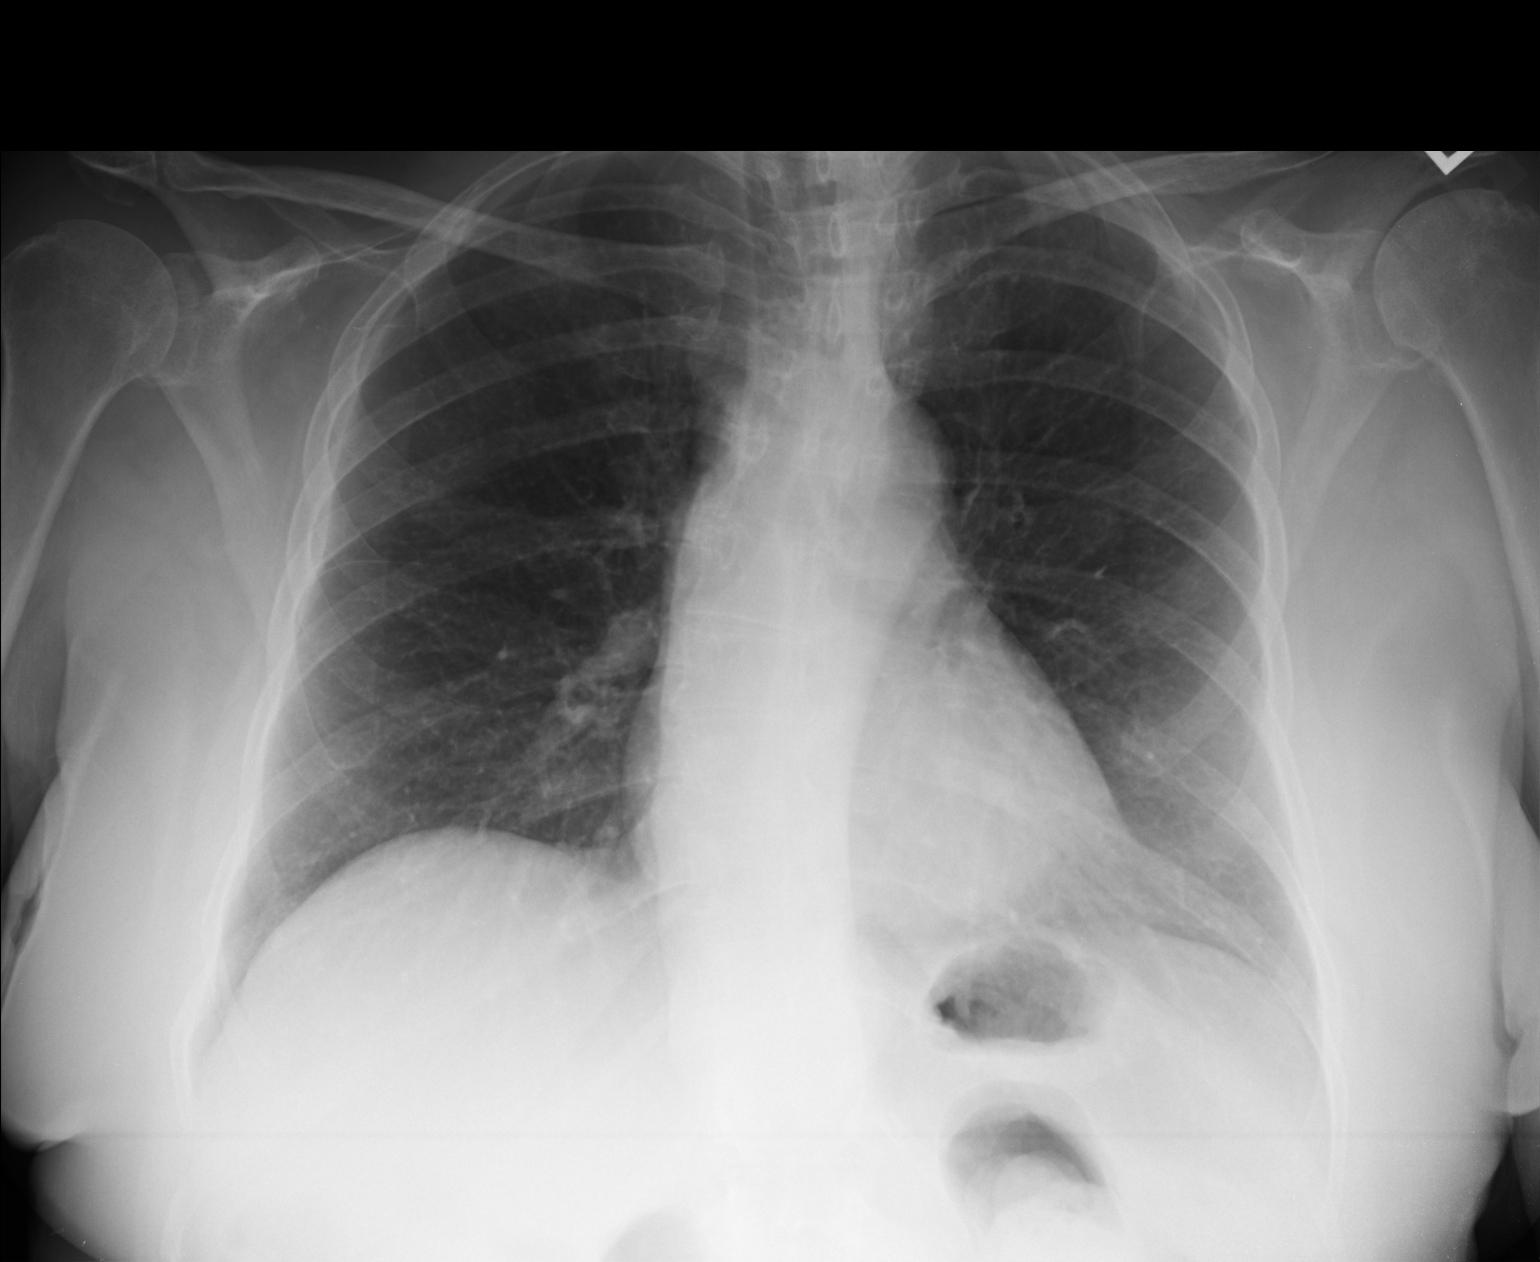

[lateral]
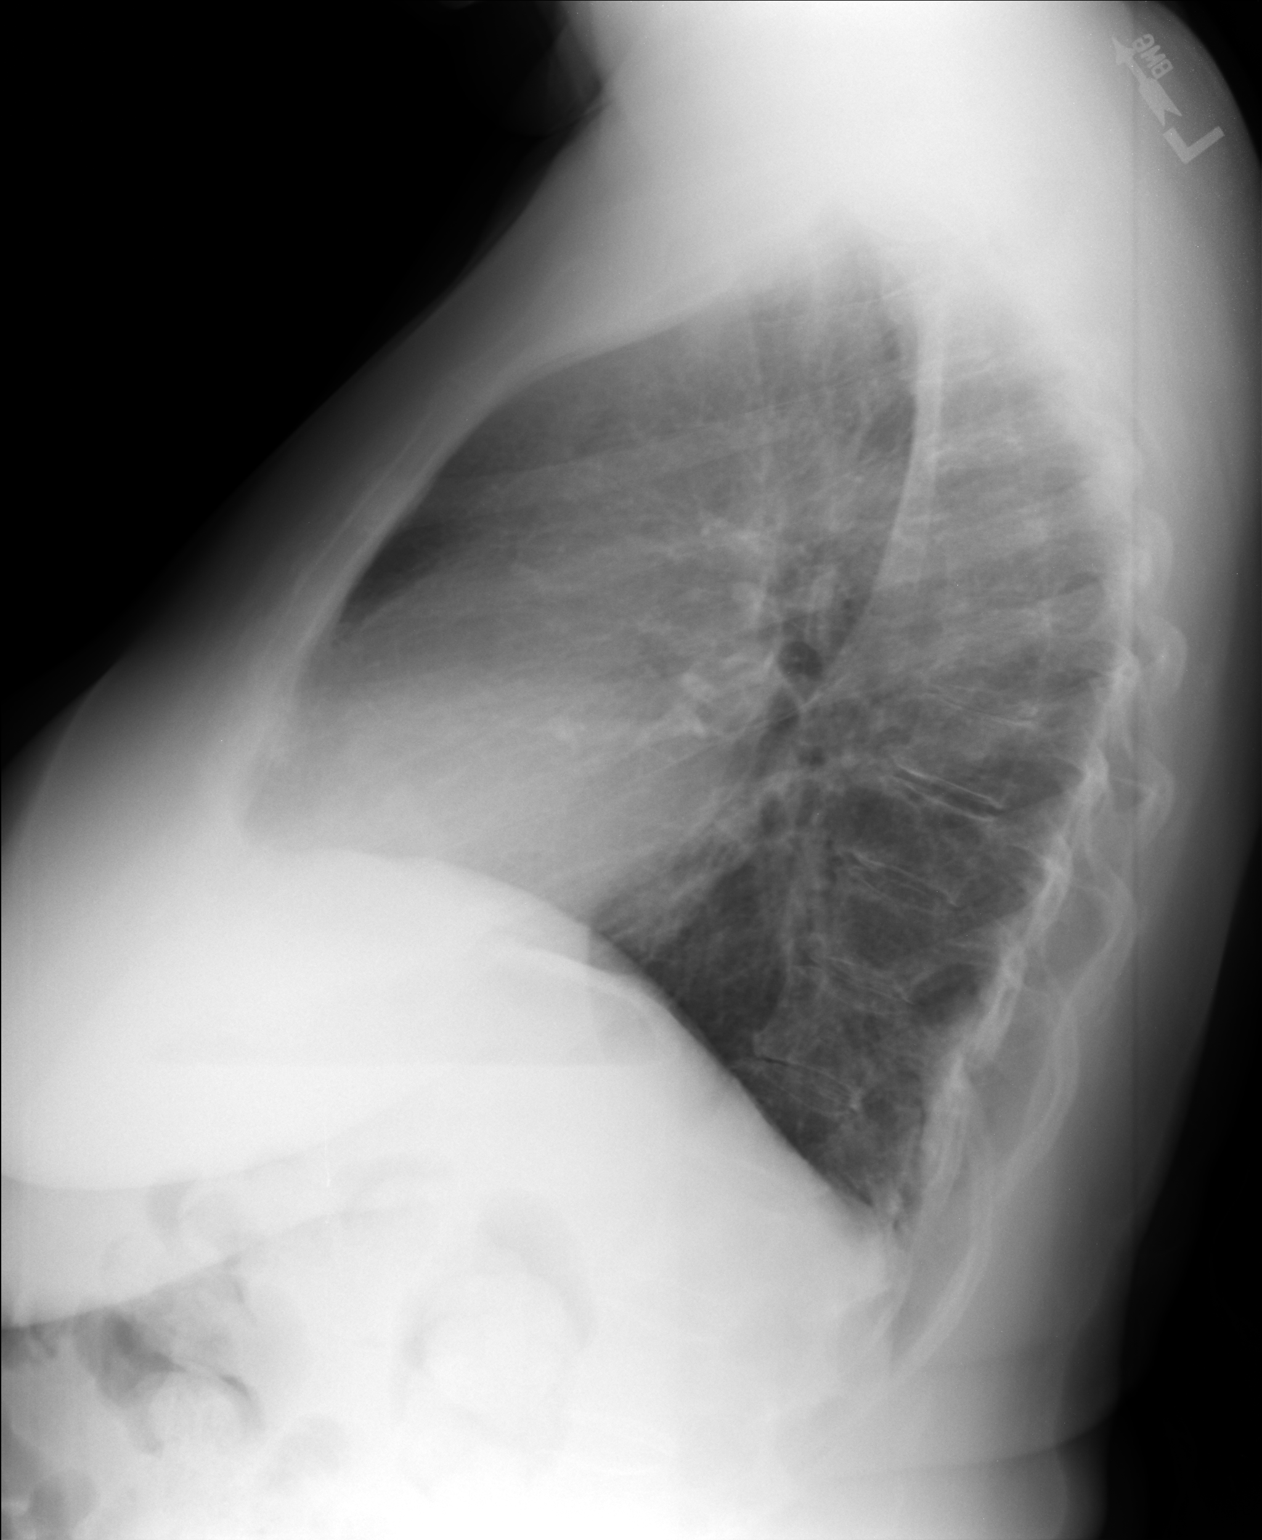

[2 of 2 positions shown; findings below may reference images not displayed]

FINDINGS: Lungs are clear. Heart size and pulmonary vascularity are normal. No
adenopathy. There is upper thoracic levoscoliosis. There is
degenerative change in the thoracic spine. There is evidence of old
trauma involving the lateral left clavicle, stable.
IMPRESSION: No edema or consolidation.

## 2015-10-27 ENCOUNTER — Other Ambulatory Visit: Payer: Self-pay | Admitting: Family Medicine

## 2015-11-08 NOTE — Addendum Note (Signed)
Addended by: Wyatt Haste on: 11/08/2015 01:30 PM   Modules accepted: Miquel Dunn

## 2015-11-15 ENCOUNTER — Ambulatory Visit (INDEPENDENT_AMBULATORY_CARE_PROVIDER_SITE_OTHER): Payer: Medicare Other | Admitting: Family Medicine

## 2015-11-15 ENCOUNTER — Encounter: Payer: Self-pay | Admitting: Family Medicine

## 2015-11-15 ENCOUNTER — Ambulatory Visit (INDEPENDENT_AMBULATORY_CARE_PROVIDER_SITE_OTHER)
Admission: RE | Admit: 2015-11-15 | Discharge: 2015-11-15 | Disposition: A | Discharge status: Home / Self Care | Payer: Medicare Other | Source: Ambulatory Visit | Attending: Family Medicine | Admitting: Family Medicine

## 2015-11-15 VITALS — BP 140/86 | HR 103 | Ht 64.0 in | Wt 190.0 lb

## 2015-11-15 DIAGNOSIS — S92253G Displaced fracture of navicular [scaphoid] of unspecified foot, subsequent encounter for fracture with delayed healing: Secondary | ICD-10-CM | POA: Insufficient documentation

## 2015-11-15 DIAGNOSIS — M75102 Unspecified rotator cuff tear or rupture of left shoulder, not specified as traumatic: Secondary | ICD-10-CM

## 2015-11-15 DIAGNOSIS — M1990 Unspecified osteoarthritis, unspecified site: Secondary | ICD-10-CM | POA: Diagnosis not present

## 2015-11-15 DIAGNOSIS — S92252G Displaced fracture of navicular [scaphoid] of left foot, subsequent encounter for fracture with delayed healing: Secondary | ICD-10-CM | POA: Diagnosis not present

## 2015-11-15 DIAGNOSIS — M25572 Pain in left ankle and joints of left foot: Secondary | ICD-10-CM

## 2015-11-15 DIAGNOSIS — M66879 Spontaneous rupture of other tendons, unspecified ankle and foot: Secondary | ICD-10-CM | POA: Insufficient documentation

## 2015-11-15 DIAGNOSIS — M66872 Spontaneous rupture of other tendons, left ankle and foot: Secondary | ICD-10-CM

## 2015-11-15 DIAGNOSIS — M19049 Primary osteoarthritis, unspecified hand: Secondary | ICD-10-CM

## 2015-11-15 DIAGNOSIS — M7989 Other specified soft tissue disorders: Secondary | ICD-10-CM | POA: Diagnosis not present

## 2015-11-15 MED ORDER — VITAMIN D (ERGOCALCIFEROL) 1.25 MG (50000 UNIT) PO CAPS: 50000.0000 [IU] | ORAL_CAPSULE | ORAL | Status: DC

## 2015-11-15 NOTE — Assessment & Plan Note (Signed)
Left-sided arthritis with positive grind test. Do not think anything else would be very beneficial at this time except for the vitamin D. We discussed ergonomics and lifting procedures. Patient will try to make these changes and come back and see me again in 3 weeks. If worsening symptoms injections would be helpful.

## 2015-11-15 NOTE — Assessment & Plan Note (Signed)
Patient does have what appears to be a navicular ankle fracture. Seems to be delayed healing. Patient also has a posterior tibialis tendon tear. This is causing collection of the longitudinal arch. Discussed with patient about different treatment options and patient elected to do the Cam Walker. We will do this for 3 weeks. Vitamin D supplementation also given. We discussed icing regimen. Patient will try over-the-counter medications. Patient and will come back and see me again in 3 weeks. At that time we will re-ultrasound. We may also need advanced imaging if worsening symptoms or worsening swelling.

## 2015-11-15 NOTE — Assessment & Plan Note (Signed)
Patient does have more of a rotator cuff syndrome. No significant weakness noted but positive impingement. We discussed with patient if worsening symptoms injections could be beneficial. Patient has had a have a history of acromioplasty previously on the shoulder. Likely some arthritic changes as well. We will hold on any further imaging today. Patient come back and see me again in 3 weeks.

## 2015-11-15 NOTE — Patient Instructions (Signed)
Good to meet you  Ice 20 minutes 2 times daily. Usually after activity and before bed. Wear boot for next 3 weeks Vitamin D once weekly for 12 weeks Xray downstairs today to look at the navicular bone Tylenol 650mg  3 times daily  Turmeric 500mg  1-2 times daily for inflammation Tart cherry extract any dose at night For the foot Good shoes with rigid bottom.  Jalene Mullet, Merrell or New balance greater then 700 once we get you back in shoes.  [employer] orthotics "total support" online would be great  The thumb is arthritis as well.  The shoulder lets see how much of this is compensation and we will make sure you are doing better in 3 weeks.

## 2015-11-15 NOTE — Progress Notes (Signed)
Pre visit review using our clinic review tool, if applicable. No additional management support is needed unless otherwise documented below in the visit note. 

## 2015-11-15 NOTE — Progress Notes (Signed)
[facility] [address], [place] [address] Phone: [phone] Subjective:    I'm seeing this patient by the request  of:  SHAW,EVA, MD   CC: Left ankle pain, left thumb pain, left shoulder pain  [id number] Melinda Peterson is a 65 y.o. female coming in with complaint of multiple complaints Patient states 7 months ago she fell. Causing an injury to her ankle. Had significant bruising immediately. Had difficult he walking for proximal only 3 days. Now has swelling whenever she does more walking on cement. Patient states that it has a soreness. Seems to be on the medial aspect of the ankle. Sometimes can even sore to touch and even at night. Rates the severity of 8 out of 10. Feels better when she is in an ankle brace and is taking Tylenol regularly.  Patient is also complaining of left thumb pain. She think she may have Sherren Mocha herself 7 months ago since then has pain with certain range of motion. Mild weakness compared to the contralateral side. Seems to have more of a popping sensation as well. No swelling of joint. Rates the severity pain as 5 out of 10  Patient is also complaining of left shoulder pain. States that it is about 6 out of 10 and come sometimes can wake her up at night. Patient thinks was given more compensating for her ankle over the course of time. Does have a past medical history significant for acromioplasty on the shoulder multiple years ago. States that she did very well with rehabilitation. States that it is not as severe as her ankle and her thumb. Rates the severity of pain a 3 out of 10      Past Medical History  Diagnosis Date  . Allergy   . Anxiety   . Eczema   . Irritable bowel syndrome   . Osteopenia   . Hyperlipidemia   . Arthritis   . Depression   . Neuromuscular disorder ([place])   . Osteoporosis   . Chronic headaches   . Colon polyps   . Diverticulosis   . GERD (gastroesophageal reflux disease)   . HTN  (hypertension)   . Cataract    Past Surgical History  Procedure Laterality Date  . Total abdominal hysterectomy    . Acromioclavicularplasty    . Colonoscopy w/ polypectomy  2009, 2012  . Breast surgery Left   . Tubal ligation    . Skin cancer excision      lower back  . Cataract extraction, bilateral  2016   Social History   Social History  . Marital Status: Married    Spouse Name: N/A  . Number of Children: 1  . Years of Education: N/A   Occupational History  . retired    Social History Main Topics  . Smoking status: Former Smoker -- 0.30 packs/day    Types: Cigarettes  . Smokeless tobacco: Never Used  . Alcohol Use: Yes     Comment: social  . Drug Use: No  . Sexual Activity:    Partners: Male   Other Topics Concern  . 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   Allergies  Allergen Reactions  . Biaxin [Clarithromycin]   . Clarithromycin Hives  . Lincomycin Hcl Hives  . Lisinopril Swelling   Family History  Problem Relation Age of Onset  . Colon polyps Father   . Colon polyps Brother   . Heart  disease Mother   . Stroke Mother   . Mental illness Sister     Personality disorder  . Depression Sister   . Valvular heart disease Sister     Past medical history, social, surgical and family history all reviewed in electronic medical record.  No pertanent information unless stated regarding to the chief complaint.   Review of Systems: No headache, visual changes, nausea, vomiting, diarrhea, constipation, dizziness, abdominal pain, skin rash, fevers, chills, night sweats, weight loss, swollen lymph nodes, body aches, , muscle aches, chest pain, shortness of breath, mood changes.   Objective Blood pressure 140/86, pulse 103, height 5\' 4"  (1.626 m), weight 190 lb (86.183 kg), SpO2 97 %.  General: No apparent distress alert and oriented x3 mood and affect normal, dressed appropriately.  HEENT: Pupils  equal, extraocular movements intact  Respiratory: Patient's speak in full sentences and does not appear short of breath  Cardiovascular: No lower extremity edema, non tender, no erythema  Skin: Warm dry intact with no signs of infection or rash on extremities or on axial skeleton.  Abdomen: Soft nontender  Neuro: Cranial nerves II through XII are intact, neurovascularly intact in all extremities with 2+ DTRs and 2+ pulses.  Lymph: No lymphadenopathy of posterior or anterior cervical chain or axillae bilaterally.  Gait normal with good balance and coordination.  MSK:  Non tender with full range of motion and good stability and symmetric strength and tone of  elbows, wrist, hip, knee and bilaterally.  Ankle: Left Trace swelling over the navicular area at the insertion of the posterior tendon Foot exam shows the patient does have breakdown of the longitudinal arch on the left side compared to the right side. Patient does have a rigid midfoot. Over pronation of the hindfoot noted. Splaying between the first and second toes. Mild limitation in range of motion lacking the last 5 of dorsiflexion, inversion and eversion Strength is 5/5 in all directions. Stable lateral and medial ligaments; squeeze test and kleiger test unremarkable; Talar dome moderately tender No pain at base of 5th MT; No tenderness over cuboid; Moderate to severe tenderness over the navicular bone Pain over the posterior tibialis tendon No sign of peroneal tendon subluxations or tenderness to palpation Negative tarsal tunnel tinel's Able to walk 4 steps. Contralateral ankle has some mild limitation of range of motion but no pain Shoulder: Left Inspection reveals no abnormalities, atrophy or asymmetry. Positive discomfort over the acromial clavicular joint. ROM is full in all planes. Rotator cuff strength normal throughout. Positive impingement with Neer and Hawkins Speeds and Yergason's tests normal. No labral pathology  noted with negative Obrien's, negative clunk and good stability. Normal scapular function observed. No painful arc and no drop arm sign. No apprehension sign Contralateral shoulder unremarkable  Wrist: Left  Mild atrophy of the thenar eminence ROM smooth and normal with good flexion and extension and ulnar/radial deviation that is symmetrical with opposite wrist. Tender over the [facility] joint with a positive grind test No snuffbox tenderness. No tenderness over Canal of [place]. Strength 5/5 in all directions without pain. Negative Finkelstein, tinel's and phalens. Negative Watson's test. Contralateral wrist has some mild crepitus of the CMC joint but no pain.  MSK US performed of: Left ankle This study was ordered, performed, and interpreted by Charlann Boxer D.O.  Foot/Ankle:  Left All structures visualized.   Talar dome shows narrowing Ankle mortise without effusion. Peroneus longus and brevis tendons unremarkable on long and transverse views without sheath effusions. Posterior tibialis tendon  does have degenerative changes noted. Possible tear appreciated. Difficult to assess secondary to the soft tissue swelling in the area. Flexor hallucis longus, and flexor digitorum longus tendons unremarkable on long and transverse views without sheath effusions. Navicular bone in this area does have what appears to be a nonhealing avulsion fracture. Seems to be right on the navicular prominence. Hypoechoic changes and increasing Doppler flow noted. Picture saved in internalhard drive  IMPRESSION:  Nonhealing navicular stress fracture with midfoot arthritis and likely posterior tibialis tear      Impression and Recommendations:     This case required medical decision making of moderate complexity.      Note: This dictation was prepared with Dragon dictation along with smaller phrase technology. Any transcriptional errors that result from this process are unintentional.

## 2015-12-01 NOTE — Telephone Encounter (Signed)
I have called patient and left messages several times to schedule Colonoscopy. Records will be in "records reviewed" folder.

## 2015-12-06 ENCOUNTER — Encounter: Payer: Self-pay | Admitting: Family Medicine

## 2015-12-06 ENCOUNTER — Ambulatory Visit (INDEPENDENT_AMBULATORY_CARE_PROVIDER_SITE_OTHER): Payer: Medicare Other | Admitting: Family Medicine

## 2015-12-06 ENCOUNTER — Ambulatory Visit (INDEPENDENT_AMBULATORY_CARE_PROVIDER_SITE_OTHER)
Admission: RE | Admit: 2015-12-06 | Discharge: 2015-12-06 | Disposition: A | Discharge status: Home / Self Care | Payer: Medicare Other | Source: Ambulatory Visit | Attending: Family Medicine | Admitting: Family Medicine

## 2015-12-06 ENCOUNTER — Other Ambulatory Visit (INDEPENDENT_AMBULATORY_CARE_PROVIDER_SITE_OTHER): Payer: Medicare Other

## 2015-12-06 VITALS — BP 134/82 | HR 88

## 2015-12-06 DIAGNOSIS — M79642 Pain in left hand: Secondary | ICD-10-CM | POA: Diagnosis not present

## 2015-12-06 DIAGNOSIS — M1990 Unspecified osteoarthritis, unspecified site: Secondary | ICD-10-CM | POA: Diagnosis not present

## 2015-12-06 DIAGNOSIS — S92252G Displaced fracture of navicular [scaphoid] of left foot, subsequent encounter for fracture with delayed healing: Secondary | ICD-10-CM | POA: Diagnosis not present

## 2015-12-06 DIAGNOSIS — M66872 Spontaneous rupture of other tendons, left ankle and foot: Secondary | ICD-10-CM

## 2015-12-06 DIAGNOSIS — M7989 Other specified soft tissue disorders: Secondary | ICD-10-CM | POA: Diagnosis not present

## 2015-12-06 DIAGNOSIS — M19049 Primary osteoarthritis, unspecified hand: Secondary | ICD-10-CM

## 2015-12-06 DIAGNOSIS — S6992XA Unspecified injury of left wrist, hand and finger(s), initial encounter: Secondary | ICD-10-CM | POA: Diagnosis not present

## 2015-12-06 DIAGNOSIS — M75102 Unspecified rotator cuff tear or rupture of left shoulder, not specified as traumatic: Secondary | ICD-10-CM

## 2015-12-06 NOTE — Patient Instructions (Signed)
Good to see you  I am so glad you are doing better On Monday start wearing the shoe in the house the whole time After 2 weeks then even wear the shoe out of the house.  I like the grey shoe for you better Ice is good Continue the vitamin D Wear the brace on the wrist and pennsaid pinkie amount topically 2 times daily as needed.  See me again in 3 weeks to make sure foot is doing well and if the thumb still hurts we will consider injection.

## 2015-12-06 NOTE — Assessment & Plan Note (Signed)
Overall improving. Patient does have some reabsorption of the bone. I do not see any significant defect at this time. Continue with the vitamin D supplementation. We will have her start to wean out of the boot. Follow-up again in 3 weeks with pre-imaging.

## 2015-12-06 NOTE — Assessment & Plan Note (Signed)
Appears to be completely healed at this time. Very fortunate with very mild hypoechoic changes. I do think the patient will do well. Encourage her to continue with the range of motion. We'll start likely formal physical therapy potentially in 3 weeks.

## 2015-12-06 NOTE — Assessment & Plan Note (Signed)
Patient continued have pain of the [facility] joint. Likely just arthritic changes that likely had the fall and is having worsening symptoms. X-rays no ordered today rule out any type of fracture. Put in a thumb spica splint. We'll reevaluate again in 3 weeks. At that time if worsening symptoms we'll consider injection.

## 2015-12-06 NOTE — Progress Notes (Signed)
[facility] [address] [place], [place] [address] Phone: [phone] Subjective:    I'm seeing this patient by the request  of:  SHAW,EVA, MD   CC: Left ankle pain, left thumb pain, left shoulder pain follow-up  [id number] Melinda Peterson is a 65 y.o. female coming in with complaint of multiple complaints Patient states 7 months ago she fell. Causing an injury to her ankle. Had significant bruising immediately. Patient was seen previously and did have a navicular stress fracture that does seem to be nonhealing. Has been in a Cam Walker for the last 3 weeks. Feels that the swelling has improved and states that the pain is approximately 50% better as well. Not constant anymore and is sleeping more comfortably at night.  Patient is also complaining of left thumb pain. Patient was found to have arthritic changes of the [facility] joint. Continues to have pain in this area. Patient states that it seems to be getting weaker than the contralateral side. Concerned that there potentially is a fracture. States certain things such as opening a cane has become much more difficult.  Patient is also complaining of left shoulder pain. Found to have more of a rotator cuff contusion. Patient states that this is improving. Less painful than the other ones. States that she can do most daily activities. Still gives her some mild discomfort with any lifting or laying on it for a long amount of time.      Past Medical History  Diagnosis Date  . Allergy   . Anxiety   . Eczema   . Irritable bowel syndrome   . Osteopenia   . Hyperlipidemia   . Arthritis   . Depression   . Neuromuscular disorder ([place])   . Osteoporosis   . Chronic headaches   . Colon polyps   . Diverticulosis   . GERD (gastroesophageal reflux disease)   . HTN (hypertension)   . Cataract    Past Surgical History  Procedure Laterality Date  . Total abdominal hysterectomy    . Acromioclavicularplasty    .  Colonoscopy w/ polypectomy  2009, 2012  . Breast surgery Left   . Tubal ligation    . Skin cancer excision      lower back  . Cataract extraction, bilateral  2016   Social History   Social History  . Marital Status: Married    Spouse Name: N/A  . Number of Children: 1  . Years of Education: N/A   Occupational History  . retired    Social History Main Topics  . Smoking status: Former Smoker -- 0.30 packs/day    Types: Cigarettes  . Smokeless tobacco: Never Used  . Alcohol Use: Yes     Comment: social  . Drug Use: No  . Sexual Activity:    Partners: Male   Other Topics Concern  . 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   Allergies  Allergen Reactions  . Biaxin [Clarithromycin]   . Clarithromycin Hives  . Lincomycin Hcl Hives  . Lisinopril Swelling   Family History  Problem Relation Age of Onset  . Colon polyps Father   . Colon polyps Brother   . Heart disease Mother   . Stroke Mother   . Mental illness Sister     Personality disorder  . Depression Sister   . Valvular heart disease Sister     Past medical history, social, surgical and  family history all reviewed in electronic medical record.  No pertanent information unless stated regarding to the chief complaint.   Review of Systems: No headache, visual changes, nausea, vomiting, diarrhea, constipation, dizziness, abdominal pain, skin rash, fevers, chills, night sweats, weight loss, swollen lymph nodes, body aches, , muscle aches, chest pain, shortness of breath, mood changes.   Objective Blood pressure 134/82, pulse 88.  General: No apparent distress alert and oriented x3 mood and affect normal, dressed appropriately.  HEENT: Pupils equal, extraocular movements intact  Respiratory: Patient's speak in full sentences and does not appear short of breath  Cardiovascular: No lower extremity edema, non tender, no erythema  Skin: Warm dry intact with no  signs of infection or rash on extremities or on axial skeleton.  Abdomen: Soft nontender  Neuro: Cranial nerves II through XII are intact, neurovascularly intact in all extremities with 2+ DTRs and 2+ pulses.  Lymph: No lymphadenopathy of posterior or anterior cervical chain or axillae bilaterally.  Gait normal with good balance and coordination.  MSK:  Non tender with full range of motion and good stability and symmetric strength and tone of  elbows, wrist, hip, knee and bilaterally.  Ankle: Left Trace swelling over the navicular area at the insertion of the posterior tendon Foot exam shows the patient does have breakdown of the longitudinal arch on the left side compared to the right side. Patient does have a rigid midfoot. Over pronation of the hindfoot noted. Splaying between the first and second toes. Mild limitation in range of motion lacking the last 5 of dorsiflexion, inversion and eversion Strength is 5/5 in all directions. Stable lateral and medial ligaments; squeeze test and kleiger test unremarkable; Talar dome moderately tender No pain at base of 5th MT; No tenderness over cuboid; Moderate to severe tenderness over the navicular bone Pain over the posterior tibialis tendon No sign of peroneal tendon subluxations or tenderness to palpation Negative tarsal tunnel tinel's Able to walk 4 steps. Contralateral ankle has some mild limitation of range of motion but no pain   Wrist: Left  Mild atrophy of the thenar eminence ROM smooth and normal with good flexion and extension and ulnar/radial deviation that is symmetrical with opposite wrist. Tender over the [facility] joint with a positive grind test worsening from previous exam No snuffbox tenderness. No tenderness over Canal of [place]. Strength 5/5 in all directions without pain. Negative Finkelstein, tinel's and phalens. Negative Watson's test. Contralateral wrist has some mild crepitus of the CMC joint but no pain.  MSK US  performed of: Left ankle This study was ordered, performed, and interpreted by Charlann Boxer D.O.  Foot/Ankle:  Left All structures visualized.   Talar dome shows narrowing Ankle mortise without effusion. Peroneus longus and brevis tendons unremarkable on long and transverse views without sheath effusions. Posterior tibialis tendon does have degenerative changes noted. No tear appreciated significant decrease in hypoechoic changes from previous exam. Flexor hallucis longus, and flexor digitorum longus tendons unremarkable on long and transverse views without sheath effusions. Navicular bone in this area does have what appears to be a callus formation that was not seen previously. No longer any displacement of the avulsion of is noted. Decrease in hypoechoic changes surrounding the area.  IMPRESSION:  Healing navicular stress fracture as well as posterior tibialis tear      Impression and Recommendations:     This case required medical decision making of moderate complexity.      Note: This dictation was prepared with Dragon dictation  along with smaller phrase technology. Any transcriptional errors that result from this process are unintentional.

## 2015-12-06 NOTE — Assessment & Plan Note (Signed)
Overall seems to be doing relatively well. We'll continue to monitor. If worsening symptoms we'll consider injection.

## 2015-12-06 NOTE — Progress Notes (Signed)
Pre visit review using our clinic review tool, if applicable. No additional management support is needed unless otherwise documented below in the visit note. 

## 2015-12-22 ENCOUNTER — Ambulatory Visit: Payer: Medicare Other | Admitting: Family Medicine

## 2015-12-27 ENCOUNTER — Other Ambulatory Visit: Payer: Self-pay

## 2015-12-27 ENCOUNTER — Encounter: Payer: Self-pay | Admitting: Family Medicine

## 2015-12-27 ENCOUNTER — Ambulatory Visit (INDEPENDENT_AMBULATORY_CARE_PROVIDER_SITE_OTHER): Payer: Medicare Other | Admitting: Family Medicine

## 2015-12-27 VITALS — BP 122/82 | HR 105 | Ht 64.0 in | Wt 190.0 lb

## 2015-12-27 DIAGNOSIS — M19049 Primary osteoarthritis, unspecified hand: Secondary | ICD-10-CM

## 2015-12-27 DIAGNOSIS — M1990 Unspecified osteoarthritis, unspecified site: Secondary | ICD-10-CM | POA: Diagnosis not present

## 2015-12-27 DIAGNOSIS — M66872 Spontaneous rupture of other tendons, left ankle and foot: Secondary | ICD-10-CM

## 2015-12-27 DIAGNOSIS — S92252G Displaced fracture of navicular [scaphoid] of left foot, subsequent encounter for fracture with delayed healing: Secondary | ICD-10-CM

## 2015-12-27 NOTE — Progress Notes (Signed)
[facility] [place], [place] [address] Phone: [phone] Subjective:    I'm seeing this patient by the request  of:  SHAW,EVA, MD   CC: Left ankle pain, left thumb pain, left shoulder pain follow-up  [id number] Melinda Peterson is a 65 y.o. female coming in with complaint of multiple complaints Ppatient's left ankle had more of a navicular stress fracture as well as posterior tibialis tendinitis. Patient states that it is feeling better. Has not been wearing the boot on a regular basis. Denies she'll more. Not doing exercises regularly. Overall think she is doing relatively well. Has made progress. But states that she's 30% better. Has had significant stress in her life that is contributing.  Patient is also complaining of left thumb pain. Patient was found to have arthritic changes of the [facility] joint. Seems stable.  Left shoulder seems to be doing relatively well.      Past Medical History  Diagnosis Date  . Allergy   . Anxiety   . Eczema   . Irritable bowel syndrome   . Osteopenia   . Hyperlipidemia   . Arthritis   . Depression   . Neuromuscular disorder ([place])   . Osteoporosis   . Chronic headaches   . Colon polyps   . Diverticulosis   . GERD (gastroesophageal reflux disease)   . HTN (hypertension)   . Cataract    Past Surgical History  Procedure Laterality Date  . Total abdominal hysterectomy    . Acromioclavicularplasty    . Colonoscopy w/ polypectomy  2009, 2012  . Breast surgery Left   . Tubal ligation    . Skin cancer excision      lower back  . Cataract extraction, bilateral  2016   Social History   Social History  . Marital Status: Married    Spouse Name: N/A  . Number of Children: 1  . Years of Education: N/A   Occupational History  . retired    Social History Main Topics  . Smoking status: Former Smoker -- 0.30 packs/day    Types: Cigarettes  . Smokeless tobacco: Never Used  . Alcohol Use: Yes   Comment: social  . Drug Use: No  . Sexual Activity:    Partners: Male   Other Topics Concern  . 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   Allergies  Allergen Reactions  . Biaxin [Clarithromycin]   . Clarithromycin Hives  . Lincomycin Hcl Hives  . Lisinopril Swelling   Family History  Problem Relation Age of Onset  . Colon polyps Father   . Colon polyps Brother   . Heart disease Mother   . Stroke Mother   . Mental illness Sister     Personality disorder  . Depression Sister   . Valvular heart disease Sister     Past medical history, social, surgical and family history all reviewed in electronic medical record.  No pertanent information unless stated regarding to the chief complaint.   Review of Systems: No headache, visual changes, nausea, vomiting, diarrhea, constipation, dizziness, abdominal pain, skin rash, fevers, chills, night sweats, weight loss, swollen lymph nodes, body aches, , muscle aches, chest pain, shortness of breath, mood changes.   Objective Blood pressure 122/82, pulse 105, height 5\' 4"  (1.626 m), weight 190 lb (86.183 kg), SpO2 99 %.  General: No apparent distress alert and oriented x3 mood and affect normal, dressed appropriately.  HEENT: Pupils equal, extraocular movements intact  Respiratory: Patient's speak in full sentences and does not appear short of breath  Cardiovascular: No lower extremity edema, non tender, no erythema  Skin: Warm dry intact with no signs of infection or rash on extremities or on axial skeleton.  Abdomen: Soft nontender  Neuro: Cranial nerves II through XII are intact, neurovascularly intact in all extremities with 2+ DTRs and 2+ pulses.  Lymph: No lymphadenopathy of posterior or anterior cervical chain or axillae bilaterally.  Gait normal with good balance and coordination.  MSK:  Non tender with full range of motion and good stability and symmetric strength and tone  of  elbows, wrist, hip, knee and bilaterally.  Ankle: Left Trace swelling over the navicular area at the insertion of the posterior tendon Foot exam shows the patient does have breakdown of the longitudinal arch on the left side compared to the right side. Patient does have a rigid midfoot. Over pronation of the hindfoot noted. Splaying between the first and second toes. Mild limitation in range of motion lacking the last 5 of dorsiflexion, inversion and eversion Strength is 5/5 in all directions. Stable lateral and medial ligaments; squeeze test and kleiger test unremarkable; Talar dome less tender than previous exam No pain at base of 5th MT; No tenderness over cuboid; Moderate to severe tenderness over the navicular bone No pain over the posterior tibialis tendon No sign of peroneal tendon subluxations or tenderness to palpation Negative tarsal tunnel tinel's Able to walk 4 steps. Contralateral ankle has some mild limitation of range of motion but no pain   Wrist: Left  Mild atrophy of the thenar eminence ROM smooth and normal with good flexion and extension and ulnar/radial deviation that is symmetrical with opposite wrist. Tender over the [facility] joint with a positive grind test worsening from previous exam No snuffbox tenderness. No tenderness over Canal of [place]. Strength 5/5 in all directions without pain. Negative Finkelstein, tinel's and phalens. Negative Watson's test. Contralateral wrist has some mild crepitus of the [facility] joint but no pain. No change from previous exam  MSK US performed of: Left ankle This study was ordered, performed, and interpreted by Charlann Boxer D.O.  Foot/Ankle:  Left All structures visualized.   Talar dome shows narrowing Ankle mortise without effusion. Peroneus longus and brevis tendons unremarkable on long and transverse views without sheath effusions. Posterior tibialis tendon does have degenerative changes noted. No tear appreciated significant  minimal hypoechoic changes within the tendon sheath Flexor hallucis longus, and flexor digitorum longus tendons unremarkable on long and transverse views without sheath effusions. Navicular bone in this area does have what appears to be a callus formation only mild improvement from previous exam.  IMPRESSION:  Healing posterior tibialis tear but patient does have what appears to be nonhealing navicular stress fracture still.      Impression and Recommendations:     This case required medical decision making of moderate complexity.      Note: This dictation was prepared with Dragon dictation along with smaller phrase technology. Any transcriptional errors that result from this process are unintentional.

## 2015-12-27 NOTE — Assessment & Plan Note (Signed)
Overall tendon seems to be doing well. Degenerative changes noted. Navicular bone is what is more concerning with patient having what seems to be a resting healing. Continue conservative therapy. Significant amount distress will be out of the state for the next couple weeks. Discuss using the Cam Walker on a more regular basis. Continue vitamin D supplementation. Follow-up in 6 weeks.

## 2015-12-27 NOTE — Patient Instructions (Signed)
Good to see you Ice is your friend Keep wearing the good shoes I hope the weekend goes well and take boot to be safe Lets do one more time in 6-8 weeks and make sure you are almost healed!

## 2015-12-27 NOTE — Progress Notes (Signed)
Pre visit review using our clinic review tool, if applicable. No additional management support is needed unless otherwise documented below in the visit note. 

## 2015-12-27 NOTE — Assessment & Plan Note (Signed)
Patient is had some noncompliance at this time. Discussed with patient to go back to the [facility] as well as continue the vitamin D supple mentation. Patient has had a lot of stress is likely contribute in. Discussed that possible advance imaging would be warranted but then surgical intervention may be necessary which patient wants to avoid. Discussed though that if she continues to have difficult he doing the exercises in the conservative therapy this will likely take much longer to heal. Patient understands and will try to be more religious on the conservative therapy. Follow-up again in 6 weeks.

## 2015-12-27 NOTE — Assessment & Plan Note (Signed)
Stable at the moment. 

## 2016-01-26 ENCOUNTER — Encounter: Payer: Self-pay | Admitting: Family Medicine

## 2016-01-26 ENCOUNTER — Ambulatory Visit (INDEPENDENT_AMBULATORY_CARE_PROVIDER_SITE_OTHER): Payer: Medicare Other | Admitting: Family Medicine

## 2016-01-26 VITALS — BP 120/75 | HR 97 | Temp 98.4°F | Resp 16 | Ht 64.0 in | Wt 185.0 lb

## 2016-01-26 DIAGNOSIS — R7303 Prediabetes: Secondary | ICD-10-CM | POA: Diagnosis not present

## 2016-01-26 DIAGNOSIS — R05 Cough: Secondary | ICD-10-CM | POA: Diagnosis not present

## 2016-01-26 DIAGNOSIS — E8881 Metabolic syndrome: Secondary | ICD-10-CM

## 2016-01-26 DIAGNOSIS — M899 Disorder of bone, unspecified: Secondary | ICD-10-CM | POA: Diagnosis not present

## 2016-01-26 DIAGNOSIS — F39 Unspecified mood [affective] disorder: Secondary | ICD-10-CM | POA: Diagnosis not present

## 2016-01-26 DIAGNOSIS — E785 Hyperlipidemia, unspecified: Secondary | ICD-10-CM

## 2016-01-26 DIAGNOSIS — E669 Obesity, unspecified: Secondary | ICD-10-CM

## 2016-01-26 DIAGNOSIS — R9431 Abnormal electrocardiogram [ECG] [EKG]: Secondary | ICD-10-CM

## 2016-01-26 DIAGNOSIS — S92252G Displaced fracture of navicular [scaphoid] of left foot, subsequent encounter for fracture with delayed healing: Secondary | ICD-10-CM

## 2016-01-26 DIAGNOSIS — R059 Cough, unspecified: Secondary | ICD-10-CM

## 2016-01-26 DIAGNOSIS — I10 Essential (primary) hypertension: Secondary | ICD-10-CM | POA: Diagnosis not present

## 2016-01-26 MED ORDER — ALLOPURINOL 300 MG PO TABS: 300.0000 mg | ORAL_TABLET | Freq: Every day | ORAL | Status: DC

## 2016-01-26 MED ORDER — VALSARTAN-HYDROCHLOROTHIAZIDE 160-25 MG PO TABS: 1.0000 | ORAL_TABLET | Freq: Every day | ORAL | Status: DC

## 2016-01-26 MED ORDER — AMOXICILLIN 500 MG PO CAPS: 1000.0000 mg | ORAL_CAPSULE | Freq: Two times a day (BID) | ORAL | Status: DC

## 2016-01-26 MED ORDER — CYCLOBENZAPRINE HCL 10 MG PO TABS: 10.0000 mg | ORAL_TABLET | Freq: Three times a day (TID) | ORAL | Status: DC | PRN

## 2016-01-26 MED ORDER — ALPRAZOLAM 0.5 MG PO TABS: 0.5000 mg | ORAL_TABLET | Freq: Three times a day (TID) | ORAL | Status: DC | PRN

## 2016-01-26 MED ORDER — ZOLPIDEM TARTRATE 10 MG PO TABS: ORAL_TABLET | ORAL | Status: DC

## 2016-01-26 MED ORDER — ATORVASTATIN CALCIUM 20 MG PO TABS: 20.0000 mg | ORAL_TABLET | Freq: Every day | ORAL | Status: DC

## 2016-01-26 MED ORDER — DICLOFENAC SODIUM 75 MG PO TBEC: 75.0000 mg | DELAYED_RELEASE_TABLET | Freq: Two times a day (BID) | ORAL | Status: DC | PRN

## 2016-01-26 MED ORDER — SUMATRIPTAN SUCCINATE 100 MG PO TABS: ORAL_TABLET | ORAL | Status: DC

## 2016-01-26 MED ORDER — BENZONATATE 100 MG PO CAPS: 100.0000 mg | ORAL_CAPSULE | Freq: Three times a day (TID) | ORAL | Status: DC | PRN

## 2016-01-26 MED ORDER — FLUOXETINE HCL 40 MG PO CAPS: 80.0000 mg | ORAL_CAPSULE | Freq: Every day | ORAL | Status: DC

## 2016-01-26 MED ORDER — ESOMEPRAZOLE MAGNESIUM 20 MG PO CPDR: 20.0000 mg | DELAYED_RELEASE_CAPSULE | Freq: Two times a day (BID) | ORAL | Status: DC

## 2016-01-26 NOTE — Patient Instructions (Addendum)
[place] is now offering annual lung cancer screening by low-dose CT scan.  This is covered for qualifying patients and your insurance will be checked before the procedure.  Call the lung cancer screening nurse navigators at [phone] to learn more about this and get scheduled.   You need to schedule you mammogram and your bone scan.  Please call the breast center at [facility] at [phone] to schedule.  Remember to be fasting in 4 months at your next visit so we can repeat your cholesterol and diabetes test.   IF you received an x-ray today, you will receive an invoice from [facility]. Please contact [facility] at [phone] with questions or concerns regarding your invoice.   IF you received labwork today, you will receive an invoice from [employer]. Please contact [facility] at [phone] with questions or concerns regarding your invoice.   Our billing staff will not be able to assist you with questions regarding bills from these companies.  You will be contacted with the lab results as soon as they are available. The fastest way to get your results is to activate your My Chart account. Instructions are located on the last page of this paperwork. If you have not heard from [place] regarding the results in 2 weeks, please contact this office.

## 2016-01-26 NOTE — Progress Notes (Signed)
Subjective:    Patient ID: Melinda Peterson, female    DOB: 04-Apr-1951, 65 y.o.   MRN: [id number] Chief Complaint  Patient presents with  . Medication Refill  . Dizziness    states she got dizzy in shower today    HPI  Melinda Peterson is a pleasant 65 yo woman here today to follow-up on her chronic medical conditions.  I last saw her for a CPE 4 mos prior.  Chronic tachycardia and elev BP - white coat. Did see cardiology 05/2015 Dr. Marlou Porch.  Poss h/o CVA?: On asa 81mg   DDD: Requests increase in the flexeril.  She is taking the diclofenac 1-2x/d when neck flair.  Tobacco use: Wants to quite - has cut down to a few/d. Recommended to pt that she obtain screening lung CT.  She did increase over the winter with the large amount of stress but now is cutting back down again. Down to 10 cigs/d and is going tot ry to cont to cut down  Mood d/o: occ panic/anxiety. Has been on alprazolam 0.5mg  tid and zolpidem 10mg  qhs for years. Has had to go up on   MSK: numerous joint injuries and pain so recently started seeing Dr. Tamala Julian with sports medicine. She was found to have a nonhealing navicular stress fracture which has been causing pain for almost 9 mos now and preventing exercises - likely also had a posterior tibialis tear on top of arthritis. Pt was put in a CAM walker x 3 wks. After sev wks, was tried to wean off of it but healing stopped so had to go back to complete 3 mos of CAM boot. Started on vit D supp- she is doing the once weekly vit D - has 2 more weeks.  Still on tumeric and tart cherry as well as calcium.  Metabolic syndrome: [id number] 5.9 4 mos prior so advised to start a low carb diet.  Trig were 434 to LDL unreadable.  Pt reports that her cheesecake consumption has dropped dramatically since then.  Pt's husband Fritz Pickerel is also a pt of mine.  Moved here sev yrs ago from [place] where she lived most of her life. They have 1 son who is married.   She has had a sinus infection for 6-8 wks with HAs.  She did have some dizziness this a.m. reminiscient of an ear infection prior. Has been taking mucinex DM to help.   Lots of stressors.  Has not been having gastritis or GERD.  Gets acid reflux immed when coming off nexium - she plans to get in to GI for a colnoscopy and will address with GI when she scheduled later this yr.  Past Medical History  Diagnosis Date  . Allergy   . Anxiety   . Eczema   . Irritable bowel syndrome   . Osteopenia   . Hyperlipidemia   . Arthritis   . Depression   . Neuromuscular disorder ([place])   . Osteoporosis   . Chronic headaches   . Colon polyps   . Diverticulosis   . GERD (gastroesophageal reflux disease)   . HTN (hypertension)   . Cataract    Past Surgical History  Procedure Laterality Date  . Total abdominal hysterectomy    . Acromioclavicularplasty    . Colonoscopy w/ polypectomy  2009, 2012  . Breast surgery Left   . Tubal ligation    . Skin cancer excision      lower back  . Cataract extraction, bilateral  2016  Current Outpatient Prescriptions on File Prior to Visit  Medication Sig Dispense Refill  . aspirin 81 MG tablet Take 81 mg by mouth daily.    Marland Kitchen Dextromethorphan-Guaifenesin (MUCINEX DM MAXIMUM STRENGTH) 60-1200 MG TB12 Take 1 tablet by mouth every 12 (twelve) hours. 28 each 0  . Melatonin 10 MG CAPS Take 10 mg by mouth at bedtime.    . mometasone (NASONEX) 50 MCG/ACT nasal spray Place 2 sprays into the nose at bedtime. 17 g 1  . Multiple Vitamin (MULTIVITAMIN) capsule Take 1 capsule by mouth daily.    Marland Kitchen OVER THE COUNTER MEDICATION OTC Vitamin D 3 2000 mg daily    . triamcinolone cream (KENALOG) 0.1 % Apply 1 application topically 2 (two) times daily. 30 g 0  . Vitamin D, Ergocalciferol, (DRISDOL) 50000 units CAPS capsule Take 1 capsule (50,000 Units total) by mouth every 7 (seven) days. 12 capsule 0  . albuterol (PROVENTIL HFA;VENTOLIN HFA) 108 (90 BASE) MCG/ACT inhaler Inhale 2 puffs into the lungs every 4 (four) hours as needed  for wheezing or shortness of breath (cough, shortness of breath or wheezing.). (Patient not taking: Reported on 01/26/2016) 1 Inhaler 1  . ipratropium (ATROVENT HFA) 17 MCG/ACT inhaler Inhale 2 puffs into the lungs every 6 (six) hours. (Patient not taking: Reported on 01/26/2016) 1 Inhaler 5   No current facility-administered medications on file prior to visit.   Allergies  Allergen Reactions  . Biaxin [Clarithromycin]   . Clarithromycin Hives  . Lincomycin Hcl Hives  . Lisinopril Swelling   Family History  Problem Relation Age of Onset  . Colon polyps Father   . Colon polyps Brother   . Heart disease Mother   . Stroke Mother   . Mental illness Sister     Personality disorder  . Depression Sister   . Valvular heart disease Sister    Social History   Social History  . Marital Status: Married    Spouse Name: N/A  . Number of Children: 1  . Years of Education: N/A   Occupational History  . retired    Social History Main Topics  . Smoking status: Former Smoker -- 0.30 packs/day    Types: Cigarettes  . Smokeless tobacco: Never Used  . Alcohol Use: Yes     Comment: social  . Drug Use: No  . Sexual Activity:    Partners: Male   Other Topics Concern  . 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    Review of Systems See hpi    Objective:  BP 143/87 mmHg  Pulse 123  Temp(Src) 98.4 F (36.9 C) (Oral)  Resp 16  Ht 5\' 4"  (1.626 m)  Wt 185 lb (83.915 kg)  BMI 31.74 kg/m2  Physical Exam  Constitutional: She is oriented to person, place, and time. She appears well-developed and well-nourished. No distress.  HENT:  Head: Normocephalic and atraumatic.  Right Ear: External ear normal.  Left Ear: External ear normal.  Eyes: Conjunctivae are normal. No scleral icterus.  Neck: Normal range of motion. Neck supple. No thyromegaly present.  Cardiovascular: Regular rhythm, normal heart sounds and intact distal pulses.   Tachycardia present.   No murmur heard. Pulmonary/Chest: Effort normal and breath sounds normal. No respiratory distress.  Musculoskeletal: She exhibits no edema.  Lymphadenopathy:    She has no cervical adenopathy.  Neurological: She is alert and oriented to person, place, and time.  Skin: Skin is warm and  dry. She is not diaphoretic. No erythema.  Psychiatric: She has a normal mood and affect. Her behavior is normal.    [facility] reading (PRIMARY) by  Dr. Brigitte Pulse. EKG: sinus tachycardia, no acute ischemic change    Assessment & Plan:  Needs Initial AWV 09/2016 Was not able to get the EKG at her [facility] exam as machine was broken - do today but make sure pt does not get charged Vitamin D was nml 3 yrs ago Needs refills on xanax and ambien Requests to go up on her prozac.  RTC for flp, cmp, a1c, vit D in the future. Ua. Uric acid.  1. Essential hypertension   2. Navicular fracture of ankle with delayed healing, left   3. Metabolic syndrome X   4. Prediabetes   5. Hyperlipidemia, mild   6. Obesity (BMI 30.0-34.9)   7. Mood disorder ([place])   8. Bone disorder   9. Nonspecific abnormal electrocardiogram (ECG) (EKG)   10. Cough     Orders Placed This Encounter  Procedures  . EKG 12-Lead    Meds ordered this encounter  Medications  . zolpidem (AMBIEN) 10 MG tablet    Sig: TAKE 1 TABLET BY MOUTH EVERY DAY AT BEDTIME AS NEEDED FOR SLEEP    Dispense:  30 tablet    Refill:  5  . SUMAtriptan (IMITREX) 100 MG tablet    Sig: TAKE 1 TABLET BY MOUTH EVERY 2 HOURS AS NEEDED FOR MIGRAINE    Dispense:  10 tablet    Refill:  11  . FLUoxetine (PROZAC) 40 MG capsule    Sig: Take 2 capsules (80 mg total) by mouth daily.    Dispense:  180 capsule    Refill:  3  . atorvastatin (LIPITOR) 20 MG tablet    Sig: Take 1 tablet (20 mg total) by mouth at bedtime.    Dispense:  90 tablet    Refill:  1    **Patient requests 90 days supply**  . diclofenac (VOLTAREN) 75 MG EC tablet    Sig: Take 1 tablet  (75 mg total) by mouth 2 (two) times daily as needed for moderate pain.    Dispense:  60 tablet    Refill:  3  . esomeprazole (NEXIUM) 20 MG capsule    Sig: Take 1 capsule (20 mg total) by mouth 2 (two) times daily before a meal. As needed for acid reflux and indigestion    Dispense:  180 capsule    Refill:  1  . valsartan-hydrochlorothiazide (DIOVAN-HCT) 160-25 MG tablet    Sig: Take 1 tablet by mouth daily.    Dispense:  90 tablet    Refill:  3  . allopurinol (ZYLOPRIM) 300 MG tablet    Sig: Take 1 tablet (300 mg total) by mouth daily.    Dispense:  90 tablet    Refill:  1  . cyclobenzaprine (FLEXERIL) 10 MG tablet    Sig: Take 1 tablet (10 mg total) by mouth 3 (three) times daily as needed for muscle spasms.    Dispense:  60 tablet    Refill:  3  . amoxicillin (AMOXIL) 500 MG capsule    Sig: Take 2 capsules (1,000 mg total) by mouth 2 (two) times daily.    Dispense:  40 capsule    Refill:  0  . benzonatate (TESSALON) 100 MG capsule    Sig: Take 1-2 capsules (100-200 mg total) by mouth 3 (three) times daily as needed for cough.    Dispense:  60  capsule    Refill:  0  . ALPRAZolam (XANAX) 0.5 MG tablet    Sig: Take 1 tablet (0.5 mg total) by mouth 3 (three) times daily as needed. for anxiety    Dispense:  90 tablet    Refill:  4     [name], M.D.  [facility] [address] [place], [place] [address] [phone] phone [phone] fax  02/03/2016 4:03 PM

## 2016-02-21 ENCOUNTER — Ambulatory Visit: Payer: Medicare Other | Admitting: Family Medicine

## 2016-04-26 ENCOUNTER — Telehealth: Payer: Self-pay

## 2016-04-26 DIAGNOSIS — R911 Solitary pulmonary nodule: Secondary | ICD-10-CM

## 2016-04-26 NOTE — Telephone Encounter (Signed)
Dr Brigitte Pulse placed a future order last year for a CT Chest Without Contrast.  I sent the order to [facility] since the scan is due to be scheduled, but they said that the order expired on 04/18/16.  Please enter in a new order for CT Chest Without Contrast.  Thank you.  CB # for [facility]: [phone]

## 2016-04-27 NOTE — Telephone Encounter (Signed)
Yep, fine to reorder thanks.

## 2016-05-29 ENCOUNTER — Telehealth: Payer: Self-pay

## 2016-05-29 NOTE — Telephone Encounter (Signed)
Dr. Brigitte Pulse placed a future order for CT chest w/o contrast. [facility] has left multiple VM's with patient to schedule. She has an upcoming apt with Dr Brigitte Pulse on 05/31/16. Possible to determine at this visit if patient is willing to schedule?   Thanks!

## 2016-05-31 ENCOUNTER — Ambulatory Visit (INDEPENDENT_AMBULATORY_CARE_PROVIDER_SITE_OTHER): Payer: Medicare Other | Admitting: Family Medicine

## 2016-05-31 ENCOUNTER — Encounter: Payer: Self-pay | Admitting: Family Medicine

## 2016-05-31 VITALS — BP 126/82 | HR 120 | Temp 97.6°F | Ht 64.0 in | Wt 190.0 lb

## 2016-05-31 DIAGNOSIS — R7303 Prediabetes: Secondary | ICD-10-CM | POA: Diagnosis not present

## 2016-05-31 DIAGNOSIS — G4700 Insomnia, unspecified: Secondary | ICD-10-CM | POA: Diagnosis not present

## 2016-05-31 DIAGNOSIS — E8881 Metabolic syndrome: Secondary | ICD-10-CM | POA: Diagnosis not present

## 2016-05-31 DIAGNOSIS — F39 Unspecified mood [affective] disorder: Secondary | ICD-10-CM | POA: Diagnosis not present

## 2016-05-31 DIAGNOSIS — E669 Obesity, unspecified: Secondary | ICD-10-CM

## 2016-05-31 DIAGNOSIS — J329 Chronic sinusitis, unspecified: Secondary | ICD-10-CM

## 2016-05-31 DIAGNOSIS — I10 Essential (primary) hypertension: Secondary | ICD-10-CM

## 2016-05-31 DIAGNOSIS — E785 Hyperlipidemia, unspecified: Secondary | ICD-10-CM

## 2016-05-31 DIAGNOSIS — E66811 Obesity, class 1: Secondary | ICD-10-CM

## 2016-05-31 DIAGNOSIS — F411 Generalized anxiety disorder: Secondary | ICD-10-CM | POA: Diagnosis not present

## 2016-05-31 DIAGNOSIS — R911 Solitary pulmonary nodule: Secondary | ICD-10-CM

## 2016-05-31 DIAGNOSIS — Z720 Tobacco use: Secondary | ICD-10-CM

## 2016-05-31 LAB — COMPREHENSIVE METABOLIC PANEL
ALBUMIN: 4.1 g/dL (ref 3.6–5.1)
ALT: 27 U/L (ref 6–29)
AST: 22 U/L (ref 10–35)
Alkaline Phosphatase: 123 U/L (ref 33–130)
BUN: 19 mg/dL (ref 7–25)
CALCIUM: 9.1 mg/dL (ref 8.6–10.4)
CHLORIDE: 102 mmol/L (ref 98–110)
CO2: 25 mmol/L (ref 20–31)
CREATININE: 0.86 mg/dL (ref 0.50–0.99)
Glucose, Bld: 90 mg/dL (ref 65–99)
POTASSIUM: 3.5 mmol/L (ref 3.5–5.3)
Sodium: 139 mmol/L (ref 135–146)
TOTAL PROTEIN: 6.8 g/dL (ref 6.1–8.1)
Total Bilirubin: 0.6 mg/dL (ref 0.2–1.2)

## 2016-05-31 LAB — LIPID PANEL
CHOL/HDL RATIO: 4.6 ratio (ref <=5.0)
CHOLESTEROL: 219 mg/dL — AB (ref 125–200)
HDL: 48 mg/dL (ref >=46)
TRIGLYCERIDES: 424 mg/dL — AB (ref <150)

## 2016-05-31 MED ORDER — ALPRAZOLAM 0.5 MG PO TABS: 0.5000 mg | ORAL_TABLET | Freq: Three times a day (TID) | ORAL | 4 refills | Status: DC | PRN

## 2016-05-31 MED ORDER — FLUOXETINE HCL 40 MG PO CAPS: 40.0000 mg | ORAL_CAPSULE | Freq: Every day | ORAL | 1 refills | Status: DC

## 2016-05-31 MED ORDER — TRAZODONE HCL 50 MG PO TABS: 50.0000 mg | ORAL_TABLET | Freq: Every evening | ORAL | 4 refills | Status: DC | PRN

## 2016-05-31 MED ORDER — FLUOXETINE HCL 20 MG PO CAPS: 20.0000 mg | ORAL_CAPSULE | Freq: Every day | ORAL | 1 refills | Status: DC

## 2016-05-31 NOTE — Patient Instructions (Addendum)
  You need to schedule you mammogram and your bone scan.  Please call the breast center at [place] at [phone] to schedule    IF you received an x-ray today, you will receive an invoice from [facility]. Please contact [facility] at [phone] with questions or concerns regarding your invoice.   IF you received labwork today, you will receive an invoice from [employer]. Please contact [facility] at [phone] with questions or concerns regarding your invoice.   Our billing staff will not be able to assist you with questions regarding bills from these companies.  You will be contacted with the lab results as soon as they are available. The fastest way to get your results is to activate your My Chart account. Instructions are located on the last page of this paperwork. If you have not heard from [place] regarding the results in 2 weeks, please contact this office.

## 2016-05-31 NOTE — Progress Notes (Signed)
Subjective:    Patient ID: Melinda Peterson, female    DOB: 1950/12/14, 65 y.o.   MRN: [id number] No chief complaint on file. she had a chest CT without contrast but pt has not called them back to scheduled - to f/u a long nodule seen on a CT 03/2015   Chief Complaint  Patient presents with  . Follow-up    BP and lab work    HPI  Melinda Peterson is a pleasant 65 yo woman here today to follow-up on her chronic medical conditions.  I last saw her for a CPE 4 mos prior.  She is having more allergies and has noticed a little more wheezing lately.  She has had intermittent pain in her LL lobe ever since her undiagnosed pneumonia and is very tender to palpation. She has been out on wedding  She is c/o left lumbar pain and hand arthritis.  Has seen ortho. [facility]  We had increased h er prozac to 80 at her last visit but she did miss a week of it.  Her sister started trazodone and has much more mental health problems than pt.     HERE IS FROM VISIT 01/2016: Chronic tachycardia and elev BP - white coat. Did see cardiology 05/2015 Dr. Marlou Porch.  Poss h/o CVA?: On asa 81mg   DDD: Requests increase in the flexeril.  She is taking the diclofenac 1-2x/d when neck flair.  Tobacco use: Wants to quite - has cut down to a few/d. Recommended to pt that she obtain screening lung CT.  She did increase over the winter with the large amount of stress but now is cutting back down again. Down to 10 cigs/d and is going tot ry to cont to cut down  Mood d/o: occ panic/anxiety. Has been on alprazolam 0.5mg  tid and zolpidem 10mg  qhs for years. Has had to go up on this.  MSK: numerous joint injuries and pain so recently started seeing Dr. Tamala Julian with sports medicine. She was found to have a nonhealing navicular stress fracture which has been causing pain for almost 9 mos now and preventing exercises - likely also had a posterior tibialis tear on top of arthritis. Pt was put in a CAM walker x 3  wks. After sev wks, was tried to wean off of it but healing stopped so had to go back to complete 3 mos of CAM boot. Started on vit D supp- she is doing the once weekly vit D - has 2 more weeks.  Still on tumeric and tart cherry as well as calcium.  Metabolic syndrome: [id number] 5.9 4 mos prior so advised to start a low carb diet.  Trig were 434 to LDL unreadable.  Pt reports that her cheesecake consumption has dropped dramatically since then.  Pt's husband Fritz Pickerel is also a pt of mine.  Moved here sev yrs ago from [facility] where she lived most of her life. They have 1 son who is married.   She has had a sinus infection for 6-8 wks with HAs. She did have some dizziness this a.m. reminiscient of an ear infection prior. Has been taking mucinex DM to help.   Lots of stressors.  Has not been having gastritis or GERD.  Gets acid reflux immed when coming off nexium - she plans to get in to GI for a colnoscopy and will address with GI when she scheduled later this yr.  Past Medical History:  Diagnosis Date  . Allergy   .  Anxiety   . Arthritis   . Cataract   . Chronic headaches   . Colon polyps   . Depression   . Diverticulosis   . Eczema   . GERD (gastroesophageal reflux disease)   . HTN (hypertension)   . Hyperlipidemia   . Irritable bowel syndrome   . Neuromuscular disorder ([place])   . Osteopenia   . Osteoporosis    Past Surgical History:  Procedure Laterality Date  . acromioclavicularplasty    . BREAST SURGERY Left   . CATARACT EXTRACTION, BILATERAL  2016  . COLONOSCOPY W/ POLYPECTOMY  2009, 2012  . SKIN CANCER EXCISION     lower back  . TOTAL ABDOMINAL HYSTERECTOMY    . TUBAL LIGATION     Current Outpatient Prescriptions on File Prior to Visit  Medication Sig Dispense Refill  . albuterol (PROVENTIL HFA;VENTOLIN HFA) 108 (90 BASE) MCG/ACT inhaler Inhale 2 puffs into the lungs every 4 (four) hours as needed for wheezing or shortness of breath (cough, shortness of breath or  wheezing.). (Patient not taking: Reported on 01/26/2016) 1 Inhaler 1  . allopurinol (ZYLOPRIM) 300 MG tablet Take 1 tablet (300 mg total) by mouth daily. 90 tablet 1  . ALPRAZolam (XANAX) 0.5 MG tablet Take 1 tablet (0.5 mg total) by mouth 3 (three) times daily as needed. for anxiety 90 tablet 4  . amoxicillin (AMOXIL) 500 MG capsule Take 2 capsules (1,000 mg total) by mouth 2 (two) times daily. 40 capsule 0  . aspirin 81 MG tablet Take 81 mg by mouth daily.    Marland Kitchen atorvastatin (LIPITOR) 20 MG tablet Take 1 tablet (20 mg total) by mouth at bedtime. 90 tablet 1  . benzonatate (TESSALON) 100 MG capsule Take 1-2 capsules (100-200 mg total) by mouth 3 (three) times daily as needed for cough. 60 capsule 0  . cyclobenzaprine (FLEXERIL) 10 MG tablet Take 1 tablet (10 mg total) by mouth 3 (three) times daily as needed for muscle spasms. 60 tablet 3  . Dextromethorphan-Guaifenesin (MUCINEX DM MAXIMUM STRENGTH) 60-1200 MG TB12 Take 1 tablet by mouth every 12 (twelve) hours. 28 each 0  . diclofenac (VOLTAREN) 75 MG EC tablet Take 1 tablet (75 mg total) by mouth 2 (two) times daily as needed for moderate pain. 60 tablet 3  . esomeprazole (NEXIUM) 20 MG capsule Take 1 capsule (20 mg total) by mouth 2 (two) times daily before a meal. As needed for acid reflux and indigestion 180 capsule 1  . FLUoxetine (PROZAC) 40 MG capsule Take 2 capsules (80 mg total) by mouth daily. 180 capsule 3  . ipratropium (ATROVENT HFA) 17 MCG/ACT inhaler Inhale 2 puffs into the lungs every 6 (six) hours. (Patient not taking: Reported on 01/26/2016) 1 Inhaler 5  . Melatonin 10 MG CAPS Take 10 mg by mouth at bedtime.    . mometasone (NASONEX) 50 MCG/ACT nasal spray Place 2 sprays into the nose at bedtime. 17 g 1  . Multiple Vitamin (MULTIVITAMIN) capsule Take 1 capsule by mouth daily.    Marland Kitchen OVER THE COUNTER MEDICATION OTC Vitamin D 3 2000 mg daily    . SUMAtriptan (IMITREX) 100 MG tablet TAKE 1 TABLET BY MOUTH EVERY 2 HOURS AS NEEDED FOR  MIGRAINE 10 tablet 11  . triamcinolone cream (KENALOG) 0.1 % Apply 1 application topically 2 (two) times daily. 30 g 0  . valsartan-hydrochlorothiazide (DIOVAN-HCT) 160-25 MG tablet Take 1 tablet by mouth daily. 90 tablet 3  . Vitamin D, Ergocalciferol, (DRISDOL) 50000 units CAPS capsule Take  1 capsule (50,000 Units total) by mouth every 7 (seven) days. 12 capsule 0  . zolpidem (AMBIEN) 10 MG tablet TAKE 1 TABLET BY MOUTH EVERY DAY AT BEDTIME AS NEEDED FOR SLEEP 30 tablet 5   No current facility-administered medications on file prior to visit.    Allergies  Allergen Reactions  . Biaxin [Clarithromycin]   . Clarithromycin Hives  . Lincomycin Hcl Hives  . Lisinopril Swelling   Family History  Problem Relation Age of Onset  . Colon polyps Father   . Colon polyps Brother   . Heart disease Mother   . Stroke Mother   . Mental illness Sister     Personality disorder  . Depression Sister   . Valvular heart disease Sister    Social History   Social History  . Marital status: Married    Spouse name: N/A  . Number of children: 1  . Years of education: N/A   Occupational History  . retired    Social History Main Topics  . Smoking status: Former Smoker    Packs/day: 0.30    Types: Cigarettes  . Smokeless tobacco: Never Used  . Alcohol use Yes     Comment: social  . Drug use: No  . Sexual activity: Yes    Partners: Male   Other Topics Concern  . 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 She does not exercise.    Review of Systems See hpi    Objective:  BP 126/82   Pulse (!) 120   Temp 97.6 F (36.4 C) (Oral)   Ht 5\' 4"  (1.626 m)   Wt 190 lb (86.2 kg)   BMI 32.61 kg/m   Physical Exam  Constitutional: She is oriented to person, place, and time. She appears well-developed and well-nourished. No distress.  HENT:  Head: Normocephalic and atraumatic.  Right Ear: External ear normal.  Left Ear: External ear normal.  Eyes:  Conjunctivae are normal. No scleral icterus.  Neck: Normal range of motion. Neck supple. No thyromegaly present.  Cardiovascular: Normal rate, regular rhythm, normal heart sounds and intact distal pulses.   Pulmonary/Chest: Effort normal and breath sounds normal. No respiratory distress.  Musculoskeletal: She exhibits no edema.  Lymphadenopathy:    She has no cervical adenopathy.  Neurological: She is alert and oriented to person, place, and time.  Skin: Skin is warm and dry. She is not diaphoretic. No erythema.  Psychiatric: She has a normal mood and affect. Her behavior is normal.        Assessment & Plan:   Needs Initial AWV 09/2016 Vitamin D was nml 3 yrs ago Needs refills on xanax and ambien Requests to go up on her prozac.  F/u - recheck on prior abnmo flp, cmp, a1c today. Had vit D 41 in 2013.   1. Essential hypertension   2. Chronic recurrent sinusitis   3. Hyperlipidemia, mild   4. Metabolic syndrome X   5. Obesity (BMI 30.0-34.9)   6. Prediabetes   7. Tobacco use   8. Lung nodule   9. Insomnia, unspecified type   10. Anxiety state   11. Episodic mood disorder ([place])     Orders Placed This Encounter  Procedures  . Lipid panel    Order Specific Question:   Has the patient fasted?    Answer:   Yes  . Comprehensive metabolic panel    Order Specific Question:   Has the patient fasted?  Answer:   Yes  . Hemoglobin A1c    Meds ordered this encounter  Medications  . Turmeric 500 MG TABS    Sig: Take by mouth.  . traZODone (DESYREL) 50 MG tablet    Sig: Take 1-2 tablets (50-100 mg total) by mouth at bedtime as needed for sleep.    Dispense:  60 tablet    Refill:  4  . FLUoxetine (PROZAC) 40 MG capsule    Sig: Take 1 capsule (40 mg total) by mouth daily. Take with the 20mg  to make 60mg     Dispense:  90 capsule    Refill:  1  . FLUoxetine (PROZAC) 20 MG capsule    Sig: Take 1 capsule (20 mg total) by mouth daily. To take with the prozac 40mg  to make 60mg   daily    Dispense:  90 capsule    Refill:  1  . ALPRAZolam (XANAX) 0.5 MG tablet    Sig: Take 1 tablet (0.5 mg total) by mouth 3 (three) times daily as needed. for anxiety    Dispense:  90 tablet    Refill:  4    [name], M.D.  [facility] [address] [place],  [address] [phone] phone [phone] fax  06/12/16 10:26 PM

## 2016-06-01 ENCOUNTER — Telehealth: Payer: Self-pay | Admitting: Emergency Medicine

## 2016-06-01 LAB — HEMOGLOBIN A1C
HEMOGLOBIN A1C: 5.3 % (ref <5.7)
MEAN PLASMA GLUCOSE: 105 mg/dL

## 2016-06-01 NOTE — Telephone Encounter (Signed)
Left message to find out if patient scheduled CT scan with [place]

## 2016-08-01 ENCOUNTER — Other Ambulatory Visit: Payer: Self-pay | Admitting: Family Medicine

## 2016-08-07 NOTE — Telephone Encounter (Signed)
05/2018 last ov and labs

## 2016-09-26 DIAGNOSIS — R911 Solitary pulmonary nodule: Secondary | ICD-10-CM | POA: Insufficient documentation

## 2016-09-26 DIAGNOSIS — F419 Anxiety disorder, unspecified: Secondary | ICD-10-CM | POA: Insufficient documentation

## 2016-09-26 DIAGNOSIS — M810 Age-related osteoporosis without current pathological fracture: Secondary | ICD-10-CM | POA: Insufficient documentation

## 2016-09-26 DIAGNOSIS — IMO0001 Reserved for inherently not codable concepts without codable children: Secondary | ICD-10-CM | POA: Insufficient documentation

## 2016-09-26 DIAGNOSIS — G4700 Insomnia, unspecified: Secondary | ICD-10-CM | POA: Insufficient documentation

## 2016-09-26 DIAGNOSIS — M1990 Unspecified osteoarthritis, unspecified site: Secondary | ICD-10-CM | POA: Insufficient documentation

## 2016-09-26 NOTE — Progress Notes (Signed)
Subjective:    Patient ID: Melinda Peterson, female    DOB: 11/05/1950, 66 y.o.   MRN: [id number] Chief Complaint  Patient presents with  . Follow-up    discuss depression medication  . Sinusitis    x 1 wk    HPI  Melinda Peterson is a 66 yo woman who is here to discuss medications as well as having some sinus issues.  She was last seen 4 mos ago for review of her chronic medical conditions.   Metabolic syndrome:  HTN: Chronic tachycardia and elev BP - white coat. Did see cardiology 05/2015 Dr. Marlou Porch. On valsartan-hctz 160-25  Poss h/o CVA????: On asa 81mg   RESOLVED Pre-DM: hgba1c 5.9 yr prior -> 5.3 10/17 which she cut down on her cheesecake intake.   HLD: Total chol 222 and Trig 434 so LDL not calc 1 yr prior. Pt worked on diet. And 4 mos prior lipid panel unchanged with total chol 219 and trig 424 w/ LDL not calc.  On lipitor 20 for years - increased from 10 in 2014.  Obesity: BMI 32.61 ->1    Seasonal/Environmental allergies with h/o LLL PNA in the last yr. She does get chronic recurrent sinus infxns/ear infxns every few months. On nasonex qhs with prn atrovent nasal spray? Prn albuterol  Arthritis: numerous joint injuries and pain; lumbar DDD worse on left - on chronic flexeril, cervical DDD pain - uses diclofenac 1-2x/d when flared, hand (Lft [facility]). Seen ortho and Dr. Tamala Julian, [facility]. Had a rough time last year due to left posterior tibialis tear healed and chronic nonhealing navicular stress fracture that kept her in a CAM boot for months and prevented exercise.  H/o gout: On allopurinol 300mg  for years (prior to 2013) but no prior uric acid level  Osteoporosis: No prior DEXA seen in chart Vitamin D def: completed once wkly high dose course. Vit D Level nml 3 yrs prior (41 in 2013). Takes calcium supp (as well as tumeric and tart cherry supps)  Mood d/o w/ depression/anxiety/insomnia: Increased Prozac 40 to 80 past June but then decreased to 60 at last visit 4 mos prior since was  starting trazodone 50-100mg  qhs for sleep. Has been on alprazolam 0.5mg  tid and zolpidem 10mg  qhs for years but sxs sig worsened with increased stress.  6 mo supply of zolpidem 10 has been out for 2 mos.  She is taking trazodone and zolpidem 5mg .  She goes to bed at 2-3 a.m. And wakes up noon.  She takes med at 10 pm to noon.  She feels paralyzed and just sits there. She can't get anything done on her list.  She is having some panic attacks but still somewhat less anxious.  Is having a lot of depression from family loss. She has cut back on how much she is buying off of [employer] but will go for weeks w/o showering.  Her grandson had an emergency appendectomy last wkend.  She has only had a few nights that she has not slept.   Tobacco use: has been trying to cut down to quit but smokes more with stress. Last summer she reported down to "a few" and in Oct was at 1/2 ppd. Don't think she ever called about screening lung CT?  03/2015 chest CT showed Small sub 3 mm upper lobe pulmonary nodules and rec repeat chest CT in 1 year which was ordered but pt never scheduled.   GERD/IBS: dependent on nexium 20 bid but she is only taking qd and  it does not work very well, sometimes she will  Take twice a day and this does work better for her. Pt planned to see GI for screening colonoscopy and to discuss at that time. She knows she needs to make an appt for GI.   Eczema: prn TAC - does not need  Migraines: prn imitrex   Pt's husband Melinda Peterson is also a pt of mine.  Moved here sev yrs ago from [facility] where she lived most of her life. They have 1 son who is married.   Depression screen [facility] 2/9 09/27/2016 05/31/2016 01/26/2016 09/22/2015 04/14/2015  Decreased Interest 0 0 0 0 0  Down, Depressed, Hopeless 0 0 0 0 0  PHQ - 2 Score 0 0 0 0 0     Past Medical History:  Diagnosis Date  . Allergy   . Anxiety   . Arthritis   . Cataract   . Chronic headaches   . Colon polyps   . Depression   . Diverticulosis   .  Eczema   . GERD (gastroesophageal reflux disease)   . HTN (hypertension)   . Hyperlipidemia   . Irritable bowel syndrome   . Neuromuscular disorder ([place])   . Osteopenia   . Osteoporosis    Past Surgical History:  Procedure Laterality Date  . acromioclavicularplasty    . BREAST SURGERY Left   . CATARACT EXTRACTION, BILATERAL  2016  . COLONOSCOPY W/ POLYPECTOMY  2009, 2012  . SKIN CANCER EXCISION     lower back  . TOTAL ABDOMINAL HYSTERECTOMY    . TUBAL LIGATION     Current Outpatient Prescriptions on File Prior to Visit  Medication Sig Dispense Refill  . albuterol (PROVENTIL HFA;VENTOLIN HFA) 108 (90 BASE) MCG/ACT inhaler Inhale 2 puffs into the lungs every 4 (four) hours as needed for wheezing or shortness of breath (cough, shortness of breath or wheezing.). 1 Inhaler 1  . allopurinol (ZYLOPRIM) 300 MG tablet TAKE 1 TABLET(300 MG) BY MOUTH DAILY 90 tablet 0  . ALPRAZolam (XANAX) 0.5 MG tablet Take 1 tablet (0.5 mg total) by mouth 3 (three) times daily as needed. for anxiety 90 tablet 4  . aspirin 81 MG tablet Take 81 mg by mouth daily.    Marland Kitchen atorvastatin (LIPITOR) 20 MG tablet TAKE 1 TABLET BY MOUTH AT BEDTIME 90 tablet 0  . cyclobenzaprine (FLEXERIL) 10 MG tablet Take 1 tablet (10 mg total) by mouth 3 (three) times daily as needed for muscle spasms. 60 tablet 3  . diclofenac (VOLTAREN) 75 MG EC tablet Take 1 tablet (75 mg total) by mouth 2 (two) times daily as needed for moderate pain. 60 tablet 3  . esomeprazole (NEXIUM) 20 MG capsule TAKE ONE CAPSULE BY MOUTH TWICE DAILY BEFORE A MEAL AS NEEDED FOR ACID REFLUX AND INDIGESTION 180 capsule 0  . FLUoxetine (PROZAC) 20 MG capsule Take 1 capsule (20 mg total) by mouth daily. To take with the prozac 40mg  to make 60mg  daily 90 capsule 1  . FLUoxetine (PROZAC) 40 MG capsule Take 2 capsules (80 mg total) by mouth daily. 180 capsule 3  . FLUoxetine (PROZAC) 40 MG capsule Take 1 capsule (40 mg total) by mouth daily. Take with the 20mg  to make  60mg  90 capsule 1  . ipratropium (ATROVENT HFA) 17 MCG/ACT inhaler Inhale 2 puffs into the lungs every 6 (six) hours. 1 Inhaler 5  . Melatonin 10 MG CAPS Take 10 mg by mouth at bedtime.    . mometasone (NASONEX) 50  MCG/ACT nasal spray Place 2 sprays into the nose at bedtime. 17 g 1  . Multiple Vitamin (MULTIVITAMIN) capsule Take 1 capsule by mouth daily.    Marland Kitchen OVER THE COUNTER MEDICATION OTC Vitamin D 3 2000 mg daily    . SUMAtriptan (IMITREX) 100 MG tablet TAKE 1 TABLET BY MOUTH EVERY 2 HOURS AS NEEDED FOR MIGRAINE 10 tablet 11  . traZODone (DESYREL) 50 MG tablet Take 1-2 tablets (50-100 mg total) by mouth at bedtime as needed for sleep. 60 tablet 4  . triamcinolone cream (KENALOG) 0.1 % Apply 1 application topically 2 (two) times daily. 30 g 0  . Turmeric 500 MG TABS Take by mouth.    . valsartan-hydrochlorothiazide (DIOVAN-HCT) 160-25 MG tablet Take 1 tablet by mouth daily. 90 tablet 3  . zolpidem (AMBIEN) 10 MG tablet TAKE 1 TABLET BY MOUTH EVERY DAY AT BEDTIME AS NEEDED FOR SLEEP 30 tablet 5   No current facility-administered medications on file prior to visit.    Allergies  Allergen Reactions  . Biaxin [Clarithromycin]   . Clarithromycin Hives  . Lincomycin Hcl Hives  . Lisinopril Swelling   Family History  Problem Relation Age of Onset  . Colon polyps Father   . Colon polyps Brother   . Heart disease Mother   . Stroke Mother   . Mental illness Sister     Personality disorder  . Depression Sister   . Valvular heart disease Sister    Social History   Social History  . Marital status: Married    Spouse name: N/A  . Number of children: 1  . Years of education: N/A   Occupational History  . retired    Social History Main Topics  . Smoking status: Former Smoker    Packs/day: 0.30    Types: Cigarettes  . Smokeless tobacco: Never Used  . Alcohol use Yes     Comment: social  . Drug use: No  . Sexual activity: Yes    Partners: Male   Other Topics Concern  . None    Social History Narrative   Married. Patient has a Financial risk analyst. She does not exercise.    Review of Systems  Constitutional: Positive for activity change, appetite change, chills, diaphoresis and fatigue. Negative for fever and unexpected weight change.  HENT: Positive for congestion, postnasal drip, sinus pressure and sneezing. Negative for ear pain, facial swelling, sinus pain, trouble swallowing and voice change.   Eyes: Negative for visual disturbance.  Respiratory: Positive for cough. Negative for chest tightness, shortness of breath and wheezing.   Cardiovascular: Negative for chest pain, palpitations and leg swelling.  Musculoskeletal: Positive for arthralgias, back pain, myalgias and neck stiffness. Negative for gait problem and joint swelling.  Neurological: Positive for headaches. Negative for weakness and numbness.  Psychiatric/Behavioral: Positive for behavioral problems, decreased concentration, dysphoric mood and sleep disturbance. Negative for agitation, confusion, hallucinations, self-injury and suicidal ideas. The patient is not nervous/anxious and is not hyperactive.        Objective:   Physical Exam  Constitutional: She is oriented to person, place, and time. She appears well-developed and well-nourished. She appears lethargic. She appears ill. No distress.  HENT:  Head: Normocephalic and atraumatic.  Right Ear: External ear and ear canal normal. Tympanic membrane is retracted. A middle ear effusion is present.  Left Ear: External ear and ear canal normal. Tympanic membrane is retracted. A middle ear effusion is present.  Nose: Mucosal edema and rhinorrhea present. Right sinus exhibits maxillary sinus  tenderness. Left sinus exhibits maxillary sinus tenderness.  Mouth/Throat: Uvula is midline and mucous membranes are normal. Posterior oropharyngeal erythema present. No oropharyngeal exudate, posterior oropharyngeal edema or tonsillar abscesses.  Eyes: Conjunctivae  are normal. Right eye exhibits no discharge. Left eye exhibits no discharge. No scleral icterus.  Neck: Normal range of motion. Neck supple.  Cardiovascular: Normal rate, regular rhythm, normal heart sounds and intact distal pulses.   Pulmonary/Chest: Effort normal and breath sounds normal.  Lymphadenopathy:       Head (right side): Submandibular adenopathy present. No preauricular and no posterior auricular adenopathy present.       Head (left side): Submandibular adenopathy present. No preauricular and no posterior auricular adenopathy present.    She has no cervical adenopathy.       Right: No supraclavicular adenopathy present.       Left: No supraclavicular adenopathy present.  Neurological: She is oriented to person, place, and time. She appears lethargic.  Skin: Skin is warm and dry. She is not diaphoretic. No erythema.  Psychiatric: She has a normal mood and affect. Her behavior is normal.         BP 122/80   Pulse 95   Temp 97.9 F (36.6 C) (Oral)   Resp 16   Ht 5\' 4"  (1.626 m)   Wt 188 lb (85.3 kg)   SpO2 95%   BMI 32.27 kg/m   Assessment & Plan:  Scheduled for Initial AWV on 2/26 (10d) -   Refill zolpidem,? (using trazodone instead?),  Trazodone?, nasonex?, Prozac (needs refill by 4/19), nexium, diclofenac- not needed, flexeril - not needed,  Refill allopurinol (needs by 3/26) and xanax (needs by 3/19) for your next visit. Need to increase lipitor since trig still >400 likely. F/u 4 mos for additional med refills Uric acid with next labs. Repeat labs at AWV 1. Chronic recurrent sinusitis - treat with below  2. Osteoporosis with current pathological fracture with nonunion, unspecified osteoporosis type, subsequent encounter   3. Tobacco use   4. Metabolic syndrome X   5. Obesity (BMI 30.0-34.9)   6. Hyperlipidemia, mild - will need lipitor refill in 1 mo but pt will come in for fasting labs prior. -anticipate need to increase dose from 20 to 40 at that time but  will wait for repeat lab.  7. Gout of right foot, unspecified cause, unspecified chronicity   8. Arthritis   9. Recurrent major depressive disorder, in partial remission ([place]) - depression is getting worse, she is having severe trouble with motivation. Don't want to increase back up on prozac since on trazodone which does seem to be helping sleep some but may be worsening morning sedation. Start ability, cont prozac, Take trazodone and all nighttime meds no later than 8 pm and don't push through the fatigue/sedation after - go to bed.  10. Anxiety   11. Insomnia, unspecified type - refilled zolpidem 5mg  (taking 1/2 tab of the 10mg ) and trazodone 100mg  qhs - take 2 hours BEFORE desired bed time on an empty stomach to minimize a.m. sedation  12. Medication monitoring encounter   13. Lung nodule < 6cm on CT sched reg chest CT to f/u 68mm UL pulm nodules seen on CT 03/2015 (likely to small to follow on low-dose rad screening Ct) - pt's repeat chest CT scan is scheduled for 10/08/16    Meds ordered this encounter  Medications  . ARIPiprazole (ABILIFY) 5 MG tablet    Sig: Take 1 tablet (5 mg total) by mouth daily.  Dispense:  30 tablet    Refill:  5  . ranitidine (ZANTAC) 150 MG tablet    Sig: Take 1 tablet (150 mg total) by mouth 2 (two) times daily. Prn heartburn    Dispense:  60 tablet    Refill:  0  . ipratropium (ATROVENT) 0.03 % nasal spray    Sig: Place 2 sprays into the nose 4 (four) times daily.    Dispense:  30 mL    Refill:  1  . cetirizine (ZYRTEC) 10 MG tablet    Sig: Take 1 tablet (10 mg total) by mouth at bedtime.    Dispense:  30 tablet    Refill:  11  . amoxicillin (AMOXIL) 500 MG capsule    Sig: Take 1 capsule (500 mg total) by mouth 3 (three) times daily.    Dispense:  30 capsule    Refill:  0  . albuterol (PROVENTIL HFA;VENTOLIN HFA) 108 (90 Base) MCG/ACT inhaler    Sig: Inhale 2 puffs into the lungs every 4 (four) hours as needed for wheezing or shortness of breath  (cough, shortness of breath or wheezing.).    Dispense:  1 Inhaler    Refill:  1  . mometasone (NASONEX) 50 MCG/ACT nasal spray    Sig: Place 2 sprays into the nose at bedtime.    Dispense:  17 g    Refill:  1  . benzonatate (TESSALON) 100 MG capsule    Sig: Take 1-2 capsules (100-200 mg total) by mouth 3 (three) times daily as needed for cough.    Dispense:  60 capsule    Refill:  1   Over 40 min spent in face-to-face evaluation of and consultation with patient and coordination of care.  Over 50% of this time was spent counseling this patient.   [name], M.D.  Primary Care at [facility] [address] [place], [place] [address] [phone] phone [phone] fax  10/05/16 11:10 AM

## 2016-09-27 ENCOUNTER — Ambulatory Visit (INDEPENDENT_AMBULATORY_CARE_PROVIDER_SITE_OTHER): Payer: Medicare Other | Admitting: Family Medicine

## 2016-09-27 ENCOUNTER — Encounter: Payer: Self-pay | Admitting: Family Medicine

## 2016-09-27 ENCOUNTER — Encounter: Payer: Medicare Other | Admitting: Family Medicine

## 2016-09-27 VITALS — BP 122/80 | HR 95 | Temp 97.9°F | Resp 16 | Ht 64.0 in | Wt 188.0 lb

## 2016-09-27 DIAGNOSIS — Z5181 Encounter for therapeutic drug level monitoring: Secondary | ICD-10-CM

## 2016-09-27 DIAGNOSIS — F419 Anxiety disorder, unspecified: Secondary | ICD-10-CM

## 2016-09-27 DIAGNOSIS — F3341 Major depressive disorder, recurrent, in partial remission: Secondary | ICD-10-CM | POA: Diagnosis not present

## 2016-09-27 DIAGNOSIS — M8000XK Age-related osteoporosis with current pathological fracture, unspecified site, subsequent encounter for fracture with nonunion: Secondary | ICD-10-CM

## 2016-09-27 DIAGNOSIS — E785 Hyperlipidemia, unspecified: Secondary | ICD-10-CM

## 2016-09-27 DIAGNOSIS — E8881 Metabolic syndrome: Secondary | ICD-10-CM | POA: Diagnosis not present

## 2016-09-27 DIAGNOSIS — J329 Chronic sinusitis, unspecified: Secondary | ICD-10-CM

## 2016-09-27 DIAGNOSIS — R911 Solitary pulmonary nodule: Secondary | ICD-10-CM

## 2016-09-27 DIAGNOSIS — G4700 Insomnia, unspecified: Secondary | ICD-10-CM

## 2016-09-27 DIAGNOSIS — M109 Gout, unspecified: Secondary | ICD-10-CM

## 2016-09-27 DIAGNOSIS — M1990 Unspecified osteoarthritis, unspecified site: Secondary | ICD-10-CM | POA: Diagnosis not present

## 2016-09-27 DIAGNOSIS — Z720 Tobacco use: Secondary | ICD-10-CM

## 2016-09-27 DIAGNOSIS — E669 Obesity, unspecified: Secondary | ICD-10-CM | POA: Diagnosis not present

## 2016-09-27 DIAGNOSIS — IMO0001 Reserved for inherently not codable concepts without codable children: Secondary | ICD-10-CM

## 2016-09-27 MED ORDER — ALBUTEROL SULFATE HFA 108 (90 BASE) MCG/ACT IN AERS
2.0000 | INHALATION_SPRAY | RESPIRATORY_TRACT | 1 refills | Status: DC | PRN
Start: 2016-09-27 — End: 2017-12-23

## 2016-09-27 MED ORDER — AMOXICILLIN 500 MG PO CAPS: 500.0000 mg | ORAL_CAPSULE | Freq: Three times a day (TID) | ORAL | 0 refills | Status: DC

## 2016-09-27 MED ORDER — ARIPIPRAZOLE 5 MG PO TABS: 5.0000 mg | ORAL_TABLET | Freq: Every day | ORAL | 5 refills | Status: DC

## 2016-09-27 MED ORDER — MOMETASONE FUROATE 50 MCG/ACT NA SUSP: 2.0000 | Freq: Every day | NASAL | 1 refills | Status: DC

## 2016-09-27 MED ORDER — IPRATROPIUM BROMIDE 0.03 % NA SOLN: 2.0000 | Freq: Four times a day (QID) | NASAL | 1 refills | Status: DC

## 2016-09-27 MED ORDER — BENZONATATE 100 MG PO CAPS: 100.0000 mg | ORAL_CAPSULE | Freq: Three times a day (TID) | ORAL | 1 refills | Status: DC | PRN

## 2016-09-27 MED ORDER — RANITIDINE HCL 150 MG PO TABS: 150.0000 mg | ORAL_TABLET | Freq: Two times a day (BID) | ORAL | 0 refills | Status: DC

## 2016-09-27 MED ORDER — CETIRIZINE HCL 10 MG PO TABS: 10.0000 mg | ORAL_TABLET | Freq: Every day | ORAL | 11 refills | Status: DC

## 2016-09-27 NOTE — Patient Instructions (Addendum)
Results for orders placed or performed in visit on 05/31/16  Lipid panel  Result Value Ref Range   Cholesterol 219 (H) 125 - 200 mg/dL   Triglycerides 424 (H) <150 mg/dL   HDL 48 >=46 mg/dL   Total CHOL/HDL Ratio 4.6 <=5.0 Ratio   VLDL NOT CALC <30 mg/dL   LDL Cholesterol NOT CALC <130 mg/dL  Comprehensive metabolic panel  Result Value Ref Range   Sodium 139 135 - 146 mmol/L   Potassium 3.5 3.5 - 5.3 mmol/L   Chloride 102 98 - 110 mmol/L   CO2 25 20 - 31 mmol/L   Glucose, Bld 90 65 - 99 mg/dL   BUN 19 7 - 25 mg/dL   Creat 0.86 0.50 - 0.99 mg/dL   Total Bilirubin 0.6 0.2 - 1.2 mg/dL   Alkaline Phosphatase 123 33 - 130 U/L   AST 22 10 - 35 U/L   ALT 27 6 - 29 U/L   Total Protein 6.8 6.1 - 8.1 g/dL   Albumin 4.1 3.6 - 5.1 g/dL   Calcium 9.1 8.6 - 10.4 mg/dL  Hemoglobin A1c  Result Value Ref Range   Hgb A1c MFr Bld 5.3 <5.7 %   Mean Plasma Glucose 105 mg/dL    Take 2 tabs of your lipitor a day.  We can refill all of your chronic medications at your next visit. Make sure you are taking the ability and the trazodone shortly after dinner. Continue on the prozac 60mg . Use the atrovent nasal spray 4 times a day.   Meds ordered this encounter  Medications  . ARIPiprazole (ABILIFY) 5 MG tablet    Sig: Take 1 tablet (5 mg total) by mouth daily.    Dispense:  30 tablet    Refill:  5  . ranitidine (ZANTAC) 150 MG tablet    Sig: Take 1 tablet (150 mg total) by mouth 2 (two) times daily. Prn heartburn    Dispense:  60 tablet    Refill:  0  . ipratropium (ATROVENT) 0.03 % nasal spray    Sig: Place 2 sprays into the nose 4 (four) times daily.    Dispense:  30 mL    Refill:  1  . cetirizine (ZYRTEC) 10 MG tablet    Sig: Take 1 tablet (10 mg total) by mouth at bedtime.    Dispense:  30 tablet    Refill:  11  . amoxicillin (AMOXIL) 500 MG capsule    Sig: Take 1 capsule (500 mg total) by mouth 3 (three) times daily.    Dispense:  30 capsule    Refill:  0       IF you received  an x-ray today, you will receive an invoice from [facility]. Please contact [facility] at [phone] with questions or concerns regarding your invoice.   IF you received labwork today, you will receive an invoice from [facility]. Please contact [facility] at [phone] with questions or concerns regarding your invoice.   Our billing staff will not be able to assist you with questions regarding bills from these companies.  You will be contacted with the lab results as soon as they are available. The fastest way to get your results is to activate your My Chart account. Instructions are located on the last page of this paperwork. If you have not heard from [place] regarding the results in 2 weeks, please contact this office.

## 2016-10-03 ENCOUNTER — Other Ambulatory Visit: Payer: Self-pay | Admitting: Family Medicine

## 2016-10-03 DIAGNOSIS — IMO0001 Reserved for inherently not codable concepts without codable children: Secondary | ICD-10-CM

## 2016-10-03 DIAGNOSIS — R911 Solitary pulmonary nodule: Secondary | ICD-10-CM

## 2016-10-05 MED ORDER — TRAZODONE HCL 100 MG PO TABS: 100.0000 mg | ORAL_TABLET | Freq: Every evening | ORAL | 1 refills | Status: DC | PRN

## 2016-10-05 NOTE — Telephone Encounter (Signed)
Called to [facility] 

## 2016-10-07 NOTE — Progress Notes (Signed)
   Subjective:    Patient ID: Melinda Peterson, female    DOB: [birth year], 66 y.o.   MRN: [id number] Chief Complaint  Patient presents with  . Follow-up    from last visit, pt wants to discuss labs and dicuss medication    HPI  Melinda Peterson is a 66 yo woman who is here to follow-up on recent testing and med changes. She hs a chest CT scheduled for today at 1 pm to recheck on small lung nodule.  Insomnia: She is taking the zolpidem 5 mg (she is cutting the 10mg  in half), trazodone 100mg , and melatonin at night along with some other medication.She has not slept, she has developed dreams of horrific nightmares 1-2x which is highly unusual for her.  She has been waking up frequently every hour through the night.  She goes to bed at midnight at the latest when she takes her pills around 9:30 pm.   Mood is about the same, maybe a smidge better.   Father-in-law 4 yo and is dying right now in renal failure in [place].  GERD: the zantac did not work at all.  Allergic rhinitis: doing the nasonex and atrovent with minimal benefit. On zyretc - alost not working  HLD: She increased her atorvastatin to 40mg  2 wks ago when she was last seen.   HTN: Chronic tachycardia and elev BP - white coat. Did see cardiology 05/2015 Dr. Marlou Porch. On valsartan-hctz 160-25  Having more back pain. Needs to go to a neurologist for her neck.  Osteoporosis 5/16 non-healing navicular stress fracture Review of Systems     Objective:   Physical Exam    BP 119/67   Pulse (!) 110   Temp 97.9 F (36.6 C) (Oral)   Resp 18   Ht 5\' 4"  (1.626 m)   Wt 187 lb (84.8 kg)   SpO2 99%   BMI 32.10 kg/m      Assessment & Plan:  Has AWV scheduled 10/15/16 Flp, uric acid, cmp Cbc? Tsh? She will need a refill of her xanax at next visit. Ok to increase the zolpidem up to 10mg .  No diagnosis found.  No orders of the defined types were placed in this encounter.   Meds ordered this encounter  Medications  . atorvastatin (LIPITOR) 40 MG  tablet    Sig: Take 1 tablet (40 mg total) by mouth daily at 6 PM.    Dispense:  30 tablet    Refill:  5     [name], M.D.  Primary Care at [facility] [address] [place], [place] [address] [phone] phone [phone] fax  10/15/16 10:12 AM

## 2016-10-08 ENCOUNTER — Ambulatory Visit (INDEPENDENT_AMBULATORY_CARE_PROVIDER_SITE_OTHER): Payer: Medicare Other | Admitting: Family Medicine

## 2016-10-08 ENCOUNTER — Encounter: Payer: Self-pay | Admitting: Family Medicine

## 2016-10-08 ENCOUNTER — Ambulatory Visit
Admission: RE | Admit: 2016-10-08 | Discharge: 2016-10-08 | Disposition: A | Discharge status: Home / Self Care | Payer: Medicare Other | Source: Ambulatory Visit | Attending: Family Medicine | Admitting: Family Medicine

## 2016-10-08 VITALS — BP 119/67 | HR 110 | Temp 97.9°F | Resp 18 | Ht 64.0 in | Wt 187.0 lb

## 2016-10-08 DIAGNOSIS — G4700 Insomnia, unspecified: Secondary | ICD-10-CM | POA: Diagnosis not present

## 2016-10-08 DIAGNOSIS — E785 Hyperlipidemia, unspecified: Secondary | ICD-10-CM

## 2016-10-08 DIAGNOSIS — F3341 Major depressive disorder, recurrent, in partial remission: Secondary | ICD-10-CM | POA: Diagnosis not present

## 2016-10-08 DIAGNOSIS — F419 Anxiety disorder, unspecified: Secondary | ICD-10-CM

## 2016-10-08 DIAGNOSIS — R918 Other nonspecific abnormal finding of lung field: Secondary | ICD-10-CM | POA: Diagnosis not present

## 2016-10-08 DIAGNOSIS — IMO0001 Reserved for inherently not codable concepts without codable children: Secondary | ICD-10-CM

## 2016-10-08 DIAGNOSIS — R911 Solitary pulmonary nodule: Secondary | ICD-10-CM

## 2016-10-08 MED ORDER — ATORVASTATIN CALCIUM 40 MG PO TABS: 40.0000 mg | ORAL_TABLET | Freq: Every day | ORAL | 5 refills | Status: DC

## 2016-10-08 NOTE — Patient Instructions (Addendum)
Change the aripiprazole (abilify) 5mg  at night instead of during the day.  If this isn't working for mood or sleep at your next visit, we will change it to seroquel. You can continue the cetirizine (zyrtec) at night for allergies but it won't help you sleep - is supposed to be non-drowsyl    IF you received an x-ray today, you will receive an invoice from [facility]. Please contact [facility] at [phone] with questions or concerns regarding your invoice.   IF you received labwork today, you will receive an invoice from [facility]. Please contact [facility] at [phone] with questions or concerns regarding your invoice.   Our billing staff will not be able to assist you with questions regarding bills from these companies.  You will be contacted with the lab results as soon as they are available. The fastest way to get your results is to activate your My Chart account. Instructions are located on the last page of this paperwork. If you have not heard from [place] regarding the results in 2 weeks, please contact this office.

## 2016-10-10 ENCOUNTER — Telehealth: Payer: Self-pay

## 2016-10-10 NOTE — Telephone Encounter (Signed)
Pt called back to say never mind, she was able to locate her Abilify.

## 2016-10-10 NOTE — Telephone Encounter (Signed)
Pt states that she misplace her Abilify medication and would like to see if Dr. Brigitte Pulse can call her in another Rx until her next appointment 10/15/16.  Pt contact phone number [phone]

## 2016-10-15 ENCOUNTER — Ambulatory Visit (INDEPENDENT_AMBULATORY_CARE_PROVIDER_SITE_OTHER): Payer: Medicare Other | Admitting: Family Medicine

## 2016-10-15 VITALS — BP 119/63 | HR 108 | Temp 97.6°F | Resp 16 | Ht 64.0 in | Wt 185.0 lb

## 2016-10-15 DIAGNOSIS — Z1212 Encounter for screening for malignant neoplasm of rectum: Secondary | ICD-10-CM

## 2016-10-15 DIAGNOSIS — N6320 Unspecified lump in the left breast, unspecified quadrant: Secondary | ICD-10-CM

## 2016-10-15 DIAGNOSIS — Z136 Encounter for screening for cardiovascular disorders: Secondary | ICD-10-CM | POA: Diagnosis not present

## 2016-10-15 DIAGNOSIS — Z0000 Encounter for general adult medical examination without abnormal findings: Secondary | ICD-10-CM

## 2016-10-15 DIAGNOSIS — M109 Gout, unspecified: Secondary | ICD-10-CM

## 2016-10-15 DIAGNOSIS — E785 Hyperlipidemia, unspecified: Secondary | ICD-10-CM | POA: Diagnosis not present

## 2016-10-15 DIAGNOSIS — I10 Essential (primary) hypertension: Secondary | ICD-10-CM

## 2016-10-15 DIAGNOSIS — Z1389 Encounter for screening for other disorder: Secondary | ICD-10-CM | POA: Diagnosis not present

## 2016-10-15 DIAGNOSIS — J329 Chronic sinusitis, unspecified: Secondary | ICD-10-CM | POA: Diagnosis not present

## 2016-10-15 DIAGNOSIS — R739 Hyperglycemia, unspecified: Secondary | ICD-10-CM | POA: Diagnosis not present

## 2016-10-15 DIAGNOSIS — G4700 Insomnia, unspecified: Secondary | ICD-10-CM

## 2016-10-15 DIAGNOSIS — Z1383 Encounter for screening for respiratory disorder NEC: Secondary | ICD-10-CM

## 2016-10-15 DIAGNOSIS — K582 Mixed irritable bowel syndrome: Secondary | ICD-10-CM | POA: Diagnosis not present

## 2016-10-15 DIAGNOSIS — E669 Obesity, unspecified: Secondary | ICD-10-CM | POA: Diagnosis not present

## 2016-10-15 DIAGNOSIS — E8881 Metabolic syndrome: Secondary | ICD-10-CM | POA: Diagnosis not present

## 2016-10-15 DIAGNOSIS — Z1211 Encounter for screening for malignant neoplasm of colon: Secondary | ICD-10-CM | POA: Diagnosis not present

## 2016-10-15 DIAGNOSIS — K219 Gastro-esophageal reflux disease without esophagitis: Secondary | ICD-10-CM | POA: Diagnosis not present

## 2016-10-15 DIAGNOSIS — Z1329 Encounter for screening for other suspected endocrine disorder: Secondary | ICD-10-CM

## 2016-10-15 DIAGNOSIS — M8000XK Age-related osteoporosis with current pathological fracture, unspecified site, subsequent encounter for fracture with nonunion: Secondary | ICD-10-CM

## 2016-10-15 DIAGNOSIS — M5030 Other cervical disc degeneration, unspecified cervical region: Secondary | ICD-10-CM | POA: Diagnosis not present

## 2016-10-15 DIAGNOSIS — Z1231 Encounter for screening mammogram for malignant neoplasm of breast: Secondary | ICD-10-CM

## 2016-10-15 DIAGNOSIS — E2839 Other primary ovarian failure: Secondary | ICD-10-CM

## 2016-10-15 LAB — POCT URINALYSIS DIP (MANUAL ENTRY)
BILIRUBIN UA: NEGATIVE
Blood, UA: NEGATIVE
GLUCOSE UA: NEGATIVE
Ketones, POC UA: NEGATIVE
LEUKOCYTES UA: NEGATIVE
NITRITE UA: NEGATIVE
Protein Ur, POC: NEGATIVE
Spec Grav, UA: 1.015
Urobilinogen, UA: 0.2
pH, UA: 7

## 2016-10-15 MED ORDER — ZOLPIDEM TARTRATE 10 MG PO TABS: ORAL_TABLET | ORAL | 5 refills | Status: DC

## 2016-10-15 MED ORDER — FLUOXETINE HCL 20 MG PO CAPS: 20.0000 mg | ORAL_CAPSULE | Freq: Every day | ORAL | 1 refills | Status: DC

## 2016-10-15 MED ORDER — ALPRAZOLAM 0.5 MG PO TABS: 0.5000 mg | ORAL_TABLET | Freq: Three times a day (TID) | ORAL | 5 refills | Status: DC | PRN

## 2016-10-15 NOTE — Patient Instructions (Addendum)
You can call [facility] to schedule your colonoscopy with Dr. Loletha Carrow Let me know when you want a new referral to neurology placed so then you can call to schedule at [facility]. You need to schedule you mammogram and your bone scan.  Please call the [facility] [place] at [phone] to schedule.     IF you received an x-ray today, you will receive an invoice from [facility]. Please contact [facility] at [phone] with questions or concerns regarding your invoice.   IF you received labwork today, you will receive an invoice from [place]. Please contact [facility] at [phone] with questions or concerns regarding your invoice.   Our billing staff will not be able to assist you with questions regarding bills from these companies.  You will be contacted with the lab results as soon as they are available. The fastest way to get your results is to activate your My Chart account. Instructions are located on the last page of this paperwork. If you have not heard from [place] regarding the results in 2 weeks, please contact this office.      Ms. Yankey , Thank you for taking time to come for your Medicare Wellness Visit. I appreciate your ongoing commitment to your health goals. Please review the following plan we discussed and let me know if I can assist you in the future.   These are the goals we discussed: Goals    None      This is a list of the screening recommended for you and due dates:  Health Maintenance  Topic Date Due  . Mammogram  10/05/1999  . DEXA scan (bone density measurement)  10/04/2014  . Colon Cancer Screening  09/16/2015  . Pneumonia vaccines (2 of 2 - PPSV23) 09/21/2016  . Flu Shot  11/10/2016*  . Tetanus Vaccine  01/14/2024  .  Hepatitis C: One time screening is recommended by Center for Disease Control  (CDC) for  adults born from [birth year] through 1966.   Completed  *Topic was postponed. The date shown  is not the original due date.   Bone Health Bones protect organs, store calcium, and anchor muscles. Good health habits, such as eating nutritious foods and exercising regularly, are important for maintaining healthy bones. They can also help to prevent a condition that causes bones to lose density and become weak and brittle (osteoporosis). Why is bone mass important? Bone mass refers to the amount of bone tissue that you have. The higher your bone mass, the stronger your bones. An important step toward having healthy bones throughout life is to have strong and dense bones during childhood. A young adult who has a high bone mass is more likely to have a high bone mass later in life. Bone mass at its greatest it is called peak bone mass. A large decline in bone mass occurs in older adults. In women, it occurs about the time of menopause. During this time, it is important to practice good health habits, because if more bone is lost than what is replaced, the bones will become less healthy and more likely to break (fracture). If you find that you have a low bone mass, you may be able to prevent osteoporosis or further bone loss by changing your diet and lifestyle. How can I find out if my bone mass is low? Bone mass can be measured with an X-ray test that is called a bone mineral density (BMD) test. This test is recommended for all  women who are age 57 or older. It may also be recommended for men who are age 98 or older, or for people who are more likely to develop osteoporosis due to:  Having bones that break easily.  Having a long-term disease that weakens bones, such as kidney disease or rheumatoid arthritis.  Having menopause earlier than normal.  Taking medicine that weakens bones, such as steroids, thyroid hormones, or hormone treatment for breast cancer or prostate cancer.  Smoking.  Drinking three or more alcoholic drinks each day. What are the nutritional recommendations for healthy  bones? To have healthy bones, you need to get enough of the right minerals and vitamins. Most nutrition experts recommend getting these nutrients from the foods that you eat. Nutritional recommendations vary from person to person. Ask your health care provider what is healthy for you. Here are some general guidelines. Calcium Recommendations  Calcium is the most important (essential) mineral for bone health. Most people can get enough calcium from their diet, but supplements may be recommended for people who are at risk for osteoporosis. Good sources of calcium include:  Dairy products, such as low-fat or nonfat milk, cheese, and yogurt.  Dark green leafy vegetables, such as bok choy and broccoli.  Calcium-fortified foods, such as orange juice, cereal, bread, soy beverages, and tofu products.  Nuts, such as almonds. Follow these recommended amounts for daily calcium intake:  Children, age 79?3: 700 mg.  Children, age 7?8: 1,000 mg.  Children, age 71?13: 1,300 mg.  Teens, age 22?18: 1,300 mg.  Adults, age 79?50: 1,000 mg.  Adults, age 63?70:  Men: 1,000 mg.  Women: 1,200 mg.  Adults, age 30 or older: 1,200 mg.  Pregnant and breastfeeding females:  Teens: 1,300 mg.  Adults: 1,000 mg. Vitamin D Recommendations  Vitamin D is the most essential vitamin for bone health. It helps the body to absorb calcium. Sunlight stimulates the skin to make vitamin D, so be sure to get enough sunlight. If you live in a cold climate or you do not get outside often, your health care provider may recommend that you take vitamin D supplements. Good sources of vitamin D in your diet include:  Egg yolks.  Saltwater fish.  Milk and cereal fortified with vitamin D. Follow these recommended amounts for daily vitamin D intake:  Children and teens, age 37?18: 48 international units.  Adults, age 6 or younger: 400-800 international units.  Adults, age 80 or older: 800-1,000 international  units. Other Nutrients  Other nutrients for bone health include:  Phosphorus. This mineral is found in meat, poultry, dairy foods, nuts, and legumes. The recommended daily intake for adult men and adult women is 700 mg.  Magnesium. This mineral is found in seeds, nuts, dark green vegetables, and legumes. The recommended daily intake for adult men is 400?420 mg. For adult women, it is 310?320 mg.  Vitamin K. This vitamin is found in green leafy vegetables. The recommended daily intake is 120 mg for adult men and 90 mg for adult women. What type of physical activity is best for building and maintaining healthy bones? Weight-bearing and strength-building activities are important for building and maintaining peak bone mass. Weight-bearing activities cause muscles and bones to work against gravity. Strength-building activities increases muscle strength that supports bones. Weight-bearing and muscle-building activities include:  Walking and hiking.  Jogging and running.  Dancing.  Gym exercises.  Lifting weights.  Tennis and racquetball.  Climbing stairs.  Aerobics. Adults should get at least 30 minutes of  moderate physical activity on most days. Children should get at least 60 minutes of moderate physical activity on most days. Ask your health care provide what type of exercise is best for you. Where can I find more information? For more information, check out the following websites:  [place]: [employer]se  [employer] of Health: http://www.niams.AnonymousEar.fr.asp This information is not intended to replace advice given to you by your health care provider. Make sure you discuss any questions you have with your health care provider. Document Released: 10/20/2003 Document Revised: 02/17/2016 Document Reviewed: 08/04/2014 Elsevier Interactive Patient Education  2017 [place]  Maintenance for Postmenopausal Women Menopause is a normal process in which your reproductive ability comes to an end. This process happens gradually over a span of months to years, usually between the ages of 77 and 28. Menopause is complete when you have missed 12 consecutive menstrual periods. It is important to talk with your health care provider about some of the most common conditions that affect postmenopausal women, such as heart disease, cancer, and bone loss (osteoporosis). Adopting a healthy lifestyle and getting preventive care can help to promote your health and wellness. Those actions can also lower your chances of developing some of these common conditions. What should I know about menopause? During menopause, you may experience a number of symptoms, such as:  Moderate-to-severe hot flashes.  Night sweats.  Decrease in sex drive.  Mood swings.  Headaches.  Tiredness.  Irritability.  Memory problems.  Insomnia. Choosing to treat or not to treat menopausal changes is an individual decision that you make with your health care provider. What should I know about hormone replacement therapy and supplements? Hormone therapy products are effective for treating symptoms that are associated with menopause, such as hot flashes and night sweats. Hormone replacement carries certain risks, especially as you become older. If you are thinking about using estrogen or estrogen with progestin treatments, discuss the benefits and risks with your health care provider. What should I know about heart disease and stroke? Heart disease, heart attack, and stroke become more likely as you age. This may be due, in part, to the hormonal changes that your body experiences during menopause. These can affect how your body processes dietary fats, triglycerides, and cholesterol. Heart attack and stroke are both medical emergencies. There are many things that you can do to help prevent heart disease and  stroke:  Have your blood pressure checked at least every 1-2 years. High blood pressure causes heart disease and increases the risk of stroke.  If you are 67-14 years old, ask your health care provider if you should take aspirin to prevent a heart attack or a stroke.  Do not use any tobacco products, including cigarettes, chewing tobacco, or electronic cigarettes. If you need help quitting, ask your health care provider.  It is important to eat a healthy diet and maintain a healthy weight.  Be sure to include plenty of vegetables, fruits, low-fat dairy products, and lean protein.  Avoid eating foods that are high in solid fats, added sugars, or salt (sodium).  Get regular exercise. This is one of the most important things that you can do for your health.  Try to exercise for at least 150 minutes each week. The type of exercise that you do should increase your heart rate and make you sweat. This is known as moderate-intensity exercise.  Try to do strengthening exercises at least twice each week. Do these in addition to the moderate-intensity  exercise.  Know your numbers.Ask your health care provider to check your cholesterol and your blood glucose. Continue to have your blood tested as directed by your health care provider. What should I know about cancer screening? There are several types of cancer. Take the following steps to reduce your risk and to catch any cancer development as early as possible. Breast Cancer  Practice breast self-awareness.  This means understanding how your breasts normally appear and feel.  It also means doing regular breast self-exams. Let your health care provider know about any changes, no matter how small.  If you are 35 or older, have a clinician do a breast exam (clinical breast exam or CBE) every year. Depending on your age, family history, and medical history, it may be recommended that you also have a yearly breast X-ray (mammogram).  If you have a  family history of breast cancer, talk with your health care provider about genetic screening.  If you are at high risk for breast cancer, talk with your health care provider about having an MRI and a mammogram every year.  Breast cancer (BRCA) gene test is recommended for women who have family members with BRCA-related cancers. Results of the assessment will determine the need for genetic counseling and BRCA1 and for BRCA2 testing. BRCA-related cancers include these types:  Breast. This occurs in males or females.  Ovarian.  Tubal. This may also be called fallopian tube cancer.  Cancer of the abdominal or pelvic lining (peritoneal cancer).  Prostate.  Pancreatic. Cervical, Uterine, and Ovarian Cancer  Your health care provider may recommend that you be screened regularly for cancer of the pelvic organs. These include your ovaries, uterus, and vagina. This screening involves a pelvic exam, which includes checking for microscopic changes to the surface of your cervix (Pap test).  For women ages 21-65, health care providers may recommend a pelvic exam and a Pap test every three years. For women ages 60-65, they may recommend the Pap test and pelvic exam, combined with testing for human papilloma virus (HPV), every five years. Some types of HPV increase your risk of cervical cancer. Testing for HPV may also be done on women of any age who have unclear Pap test results.  Other health care providers may not recommend any screening for nonpregnant women who are considered low risk for pelvic cancer and have no symptoms. Ask your health care provider if a screening pelvic exam is right for you.  If you have had past treatment for cervical cancer or a condition that could lead to cancer, you need Pap tests and screening for cancer for at least 20 years after your treatment. If Pap tests have been discontinued for you, your risk factors (such as having a new sexual partner) need to be reassessed to  determine if you should start having screenings again. Some women have medical problems that increase the chance of getting cervical cancer. In these cases, your health care provider may recommend that you have screening and Pap tests more often.  If you have a family history of uterine cancer or ovarian cancer, talk with your health care provider about genetic screening.  If you have vaginal bleeding after reaching menopause, tell your health care provider.  There are currently no reliable tests available to screen for ovarian cancer. Lung Cancer  Lung cancer screening is recommended for adults 27-16 years old who are at high risk for lung cancer because of a history of smoking. A yearly low-dose CT scan of  the lungs is recommended if you:  Currently smoke.  Have a history of at least 30 pack-years of smoking and you currently smoke or have quit within the past 15 years. A pack-year is smoking an average of one pack of cigarettes per day for one year. Yearly screening should:  Continue until it has been 15 years since you quit.  Stop if you develop a health problem that would prevent you from having lung cancer treatment. Colorectal Cancer  This type of cancer can be detected and can often be prevented.  Routine colorectal cancer screening usually begins at age 38 and continues through age 38.  If you have risk factors for colon cancer, your health care provider may recommend that you be screened at an earlier age.  If you have a family history of colorectal cancer, talk with your health care provider about genetic screening.  Your health care provider may also recommend using home test kits to check for hidden blood in your stool.  A small camera at the end of a tube can be used to examine your colon directly (sigmoidoscopy or colonoscopy). This is done to check for the earliest forms of colorectal cancer.  Direct examination of the colon should be repeated every 5-10 years until  age 77. However, if early forms of precancerous polyps or small growths are found or if you have a family history or genetic risk for colorectal cancer, you may need to be screened more often. Skin Cancer  Check your skin from head to toe regularly.  Monitor any moles. Be sure to tell your health care provider:  About any new moles or changes in moles, especially if there is a change in a mole's shape or color.  If you have a mole that is larger than the size of a pencil eraser.  If any of your family members has a history of skin cancer, especially at a young age, talk with your health care provider about genetic screening.  Always use sunscreen. Apply sunscreen liberally and repeatedly throughout the day.  Whenever you are outside, protect yourself by wearing long sleeves, pants, a wide-brimmed hat, and sunglasses. What should I know about osteoporosis? Osteoporosis is a condition in which bone destruction happens more quickly than new bone creation. After menopause, you may be at an increased risk for osteoporosis. To help prevent osteoporosis or the bone fractures that can happen because of osteoporosis, the following is recommended:  If you are 86-53 years old, get at least 1,000 mg of calcium and at least 600 mg of vitamin D per day.  If you are older than age 14 but younger than age 72, get at least 1,200 mg of calcium and at least 600 mg of vitamin D per day.  If you are older than age 39, get at least 1,200 mg of calcium and at least 800 mg of vitamin D per day. Smoking and excessive alcohol intake increase the risk of osteoporosis. Eat foods that are rich in calcium and vitamin D, and do weight-bearing exercises several times each week as directed by your health care provider. What should I know about how menopause affects my mental health? Depression may occur at any age, but it is more common as you become older. Common symptoms of depression include:  Low or sad  mood.  Changes in sleep patterns.  Changes in appetite or eating patterns.  Feeling an overall lack of motivation or enjoyment of activities that you previously enjoyed.  Frequent crying  spells. Talk with your health care provider if you think that you are experiencing depression. What should I know about immunizations? It is important that you get and maintain your immunizations. These include:  Tetanus, diphtheria, and pertussis (Tdap) booster vaccine.  Influenza every year before the flu season begins.  Pneumonia vaccine.  Shingles vaccine. Your health care provider may also recommend other immunizations. This information is not intended to replace advice given to you by your health care provider. Make sure you discuss any questions you have with your health care provider. Document Released: 09/21/2005 Document Revised: 02/17/2016 Document Reviewed: 05/03/2015 Elsevier Interactive Patient Education  2017 [employer].

## 2016-10-15 NOTE — Progress Notes (Signed)
Subjective:    Melinda Peterson is a 66 y.o. female who presents for Medicare Annual/Subsequent preventive examination.  Preventive Screening-Counseling & Management  Tobacco History  Smoking Status  . Former Smoker  . Packs/day: 0.30  . Types: Cigarettes  Smokeless Tobacco  . Never Used     Problems Prior to Visit 1. Chronic back pain 2. Current flair of mod d/o and insomnia  Current Problems (verified) Patient Active Problem List   Diagnosis Date Noted  . Osteoporosis 09/26/2016  . Arthritis 09/26/2016  . Anxiety 09/26/2016  . Lung nodule < 6cm on CT 09/26/2016  . Insomnia 09/26/2016  . Navicular fracture of ankle with delayed healing 11/15/2015  . [place] arthritis 11/15/2015  . Rotator cuff syndrome of left shoulder 11/15/2015  . Tibialis posterior tendon tear, nontraumatic 11/15/2015  . Metabolic syndrome X [id number]  . Tachycardia 12/24/2014  . Cataract of both eyes 09/10/2014  . Hyperlipidemia, mild 01/13/2014  . Unspecified constipation 06/08/2013  . Personal history of colonic polyps 06/08/2013  . Tobacco use 04/17/2013  . Chronic recurrent sinusitis 01/06/2012  . GERD (gastroesophageal reflux disease) 12/26/2011  . IBS (irritable bowel syndrome) 12/26/2011  . Diverticulosis of colon 12/26/2011  . Degenerative disc disease, cervical 12/26/2011  . Obesity (BMI 30.0-34.9) 12/26/2011  . HTN (hypertension) 11/29/2011  . Gout 11/29/2011  . Depression 11/29/2011    Medications Prior to Visit Current Outpatient Prescriptions on File Prior to Visit  Medication Sig Dispense Refill  . albuterol (PROVENTIL HFA;VENTOLIN HFA) 108 (90 Base) MCG/ACT inhaler Inhale 2 puffs into the lungs every 4 (four) hours as needed for wheezing or shortness of breath (cough, shortness of breath or wheezing.). 1 Inhaler 1  . allopurinol (ZYLOPRIM) 300 MG tablet TAKE 1 TABLET(300 MG) BY MOUTH DAILY 90 tablet 0  . ALPRAZolam (XANAX) 0.5 MG tablet Take 1 tablet (0.5 mg total) by mouth 3  (three) times daily as needed. for anxiety 90 tablet 4  . amoxicillin (AMOXIL) 500 MG capsule Take 1 capsule (500 mg total) by mouth 3 (three) times daily. 30 capsule 0  . ARIPiprazole (ABILIFY) 5 MG tablet Take 1 tablet (5 mg total) by mouth daily. 30 tablet 5  . aspirin 81 MG tablet Take 81 mg by mouth daily.    Marland Kitchen atorvastatin (LIPITOR) 40 MG tablet Take 1 tablet (40 mg total) by mouth daily at 6 PM. 30 tablet 5  . benzonatate (TESSALON) 100 MG capsule Take 1-2 capsules (100-200 mg total) by mouth 3 (three) times daily as needed for cough. 60 capsule 1  . cetirizine (ZYRTEC) 10 MG tablet Take 1 tablet (10 mg total) by mouth at bedtime. 30 tablet 11  . cyclobenzaprine (FLEXERIL) 10 MG tablet Take 1 tablet (10 mg total) by mouth 3 (three) times daily as needed for muscle spasms. 60 tablet 3  . diclofenac (VOLTAREN) 75 MG EC tablet Take 1 tablet (75 mg total) by mouth 2 (two) times daily as needed for moderate pain. 60 tablet 3  . esomeprazole (NEXIUM) 20 MG capsule TAKE ONE CAPSULE BY MOUTH TWICE DAILY BEFORE A MEAL AS NEEDED FOR ACID REFLUX AND INDIGESTION 180 capsule 0  . FLUoxetine (PROZAC) 20 MG capsule Take 1 capsule (20 mg total) by mouth daily. To take with the prozac 40mg  to make 60mg  daily 90 capsule 1  . FLUoxetine (PROZAC) 40 MG capsule Take 1 capsule (40 mg total) by mouth daily. Take with the 20mg  to make 60mg  90 capsule 1  . ipratropium (ATROVENT) 0.03 % nasal spray  Place 2 sprays into the nose 4 (four) times daily. 30 mL 1  . Melatonin 10 MG CAPS Take 10 mg by mouth at bedtime.    . mometasone (NASONEX) 50 MCG/ACT nasal spray Place 2 sprays into the nose at bedtime. 17 g 1  . Multiple Vitamin (MULTIVITAMIN) capsule Take 1 capsule by mouth daily.    Marland Kitchen OVER THE COUNTER MEDICATION OTC Vitamin D 3 2000 mg daily    . ranitidine (ZANTAC) 150 MG tablet Take 1 tablet (150 mg total) by mouth 2 (two) times daily. Prn heartburn 60 tablet 0  . SUMAtriptan (IMITREX) 100 MG tablet TAKE 1 TABLET BY  MOUTH EVERY 2 HOURS AS NEEDED FOR MIGRAINE 10 tablet 11  . traZODone (DESYREL) 100 MG tablet Take 1 tablet (100 mg total) by mouth at bedtime as needed for sleep. 90 tablet 1  . triamcinolone cream (KENALOG) 0.1 % Apply 1 application topically 2 (two) times daily. 30 g 0  . Turmeric 500 MG TABS Take by mouth.    . valsartan-hydrochlorothiazide (DIOVAN-HCT) 160-25 MG tablet Take 1 tablet by mouth daily. 90 tablet 3  . zolpidem (AMBIEN) 10 MG tablet TAKE 1 TABLET BY MOUTH EVERY DAY AT BEDTIME AS NEEDED FOR SLEEP 30 tablet 0   No current facility-administered medications on file prior to visit.     Current Medications (verified) Current Outpatient Prescriptions  Medication Sig Dispense Refill  . albuterol (PROVENTIL HFA;VENTOLIN HFA) 108 (90 Base) MCG/ACT inhaler Inhale 2 puffs into the lungs every 4 (four) hours as needed for wheezing or shortness of breath (cough, shortness of breath or wheezing.). 1 Inhaler 1  . allopurinol (ZYLOPRIM) 300 MG tablet TAKE 1 TABLET(300 MG) BY MOUTH DAILY 90 tablet 0  . ALPRAZolam (XANAX) 0.5 MG tablet Take 1 tablet (0.5 mg total) by mouth 3 (three) times daily as needed. for anxiety 90 tablet 4  . amoxicillin (AMOXIL) 500 MG capsule Take 1 capsule (500 mg total) by mouth 3 (three) times daily. 30 capsule 0  . ARIPiprazole (ABILIFY) 5 MG tablet Take 1 tablet (5 mg total) by mouth daily. 30 tablet 5  . aspirin 81 MG tablet Take 81 mg by mouth daily.    Marland Kitchen atorvastatin (LIPITOR) 40 MG tablet Take 1 tablet (40 mg total) by mouth daily at 6 PM. 30 tablet 5  . benzonatate (TESSALON) 100 MG capsule Take 1-2 capsules (100-200 mg total) by mouth 3 (three) times daily as needed for cough. 60 capsule 1  . cetirizine (ZYRTEC) 10 MG tablet Take 1 tablet (10 mg total) by mouth at bedtime. 30 tablet 11  . cyclobenzaprine (FLEXERIL) 10 MG tablet Take 1 tablet (10 mg total) by mouth 3 (three) times daily as needed for muscle spasms. 60 tablet 3  . diclofenac (VOLTAREN) 75 MG EC  tablet Take 1 tablet (75 mg total) by mouth 2 (two) times daily as needed for moderate pain. 60 tablet 3  . esomeprazole (NEXIUM) 20 MG capsule TAKE ONE CAPSULE BY MOUTH TWICE DAILY BEFORE A MEAL AS NEEDED FOR ACID REFLUX AND INDIGESTION 180 capsule 0  . FLUoxetine (PROZAC) 20 MG capsule Take 1 capsule (20 mg total) by mouth daily. To take with the prozac 40mg  to make 60mg  daily 90 capsule 1  . FLUoxetine (PROZAC) 40 MG capsule Take 1 capsule (40 mg total) by mouth daily. Take with the 20mg  to make 60mg  90 capsule 1  . ipratropium (ATROVENT) 0.03 % nasal spray Place 2 sprays into the nose 4 (four) times  daily. 30 mL 1  . Melatonin 10 MG CAPS Take 10 mg by mouth at bedtime.    . mometasone (NASONEX) 50 MCG/ACT nasal spray Place 2 sprays into the nose at bedtime. 17 g 1  . Multiple Vitamin (MULTIVITAMIN) capsule Take 1 capsule by mouth daily.    Marland Kitchen OVER THE COUNTER MEDICATION OTC Vitamin D 3 2000 mg daily    . ranitidine (ZANTAC) 150 MG tablet Take 1 tablet (150 mg total) by mouth 2 (two) times daily. Prn heartburn 60 tablet 0  . SUMAtriptan (IMITREX) 100 MG tablet TAKE 1 TABLET BY MOUTH EVERY 2 HOURS AS NEEDED FOR MIGRAINE 10 tablet 11  . traZODone (DESYREL) 100 MG tablet Take 1 tablet (100 mg total) by mouth at bedtime as needed for sleep. 90 tablet 1  . triamcinolone cream (KENALOG) 0.1 % Apply 1 application topically 2 (two) times daily. 30 g 0  . Turmeric 500 MG TABS Take by mouth.    . valsartan-hydrochlorothiazide (DIOVAN-HCT) 160-25 MG tablet Take 1 tablet by mouth daily. 90 tablet 3  . zolpidem (AMBIEN) 10 MG tablet TAKE 1 TABLET BY MOUTH EVERY DAY AT BEDTIME AS NEEDED FOR SLEEP 30 tablet 0   No current facility-administered medications for this visit.      Allergies (verified) Biaxin [clarithromycin]; Clarithromycin; Lincomycin hcl; and Lisinopril   PAST HISTORY  Family History Family History  Problem Relation Age of Onset  . Colon polyps Father   . Colon polyps Brother   . Heart  disease Mother   . Stroke Mother   . Mental illness Sister     Personality disorder  . Depression Sister   . Valvular heart disease Sister     Social History Social History  Substance Use Topics  . Smoking status: Former Smoker    Packs/day: 0.30    Types: Cigarettes  . Smokeless tobacco: Never Used  . Alcohol use Yes     Comment: social     Are there smokers in your home (other than you)? Yes  Risk Factors Current exercise habits: The patient does not participate in regular exercise at present.  Dietary issues discussed: Eats everything   Cardiac risk factors: advanced age (older than 53 for men, 110 for women), dyslipidemia, hypertension, obesity (BMI >= 30 kg/m2), sedentary lifestyle and smoking/ tobacco exposure.  Depression Screen Depression screen [facility] 2/9 09/27/2016 05/31/2016 01/26/2016 09/22/2015 04/14/2015  Decreased Interest 0 0 0 0 0  Down, Depressed, Hopeless 0 0 0 0 0  PHQ - 2 Score 0 0 0 0 0    Activities of Daily Living In your present state of health, do you have any difficulty performing the following activities?:  Driving? No Managing money?  No Feeding yourself? No Getting from bed to chair? No  Climbing a flight of stairs? No Preparing food and eating?: No Bathing or showering? No Getting dressed: No Getting to the toilet? No Using the toilet:No Moving around from place to place: No In the past year have you fallen or had a near fall?:No   Are you sexually active?  Yes  Do you have more than one partner?  No  Hearing Difficulties: No Do you often ask people to speak up or repeat themselves? No Do you experience ringing or noises in your ears? No Do you have difficulty understanding soft or whispered voices? No   Do you feel that you have a problem with memory? No  Do you often misplace items? No  Do you feel safe  at home?  Yes  Cognitive Testing  Alert? Yes  Normal Appearance?Yes  Oriented to person? Yes  Place? Yes   Time? Yes  Recall of  three objects?  Yes  Can perform simple calculations? Yes  Displays appropriate judgment?Yes  Can read the correct time from a watch face?Yes   Advanced Directives have been discussed with the patient? Yes  List the Names of Other Physician/Practitioners you currently use: 1.  See Care Teams  Indicate any recent Medical Services you may have received from other than [facility] providers in the past year (date may be approximate).  Immunization History  Administered Date(s) Administered  . Pneumococcal Conjugate-13 09/22/2015  . Pneumococcal Polysaccharide-23 08/13/2009  . Td 01/13/2014    Screening Tests Health Maintenance  Topic Date Due  . MAMMOGRAM  10/05/1999  . DEXA SCAN  10/04/2014  . COLONOSCOPY  09/16/2015  . PNA vac Low Risk Adult (2 of 2 - PPSV23) 09/21/2016  . INFLUENZA VACCINE  11/10/2016 (Originally 03/13/2016)  . TETANUS/TDAP  01/14/2024  . Hepatitis C Screening  Completed    All answers were reviewed with the patient and necessary referrals were made:  SHAW,EVA, MD   10/15/2016   History reviewed: allergies, current medications, past family history, past medical history, past social history, past surgical history and problem list  Review of Systems Pertinent items are noted in HPI.    Objective:  BP 119/63   Pulse (!) 108   Temp 97.6 F (36.4 C) (Oral)   Resp 16   Ht 5\' 4"  (1.626 m)   Wt 185 lb (83.9 kg)   SpO2 96%   BMI 31.76 kg/m   Visual Acuity Screening   Right eye Left eye Both eyes  Without correction:     With correction: 20/20 20/25 20/15     BP 119/63   Pulse (!) 108   Temp 97.6 F (36.4 C) (Oral)   Resp 16   Ht 5\' 4"  (1.626 m)   Wt 185 lb (83.9 kg)   SpO2 96%   BMI 31.76 kg/m   General Appearance:    Alert, cooperative, no distress, appears stated age  Head:    Normocephalic, without obvious abnormality, atraumatic  Eyes:    PERRL, conjunctiva/corneas clear, EOM's intact, fundi    benign, both eyes  Ears:    Normal TM's and  external ear canals, both ears  Nose:   Nares normal, septum midline, mucosa normal, no drainage    or sinus tenderness  Throat:   Lips, mucosa, and tongue normal; teeth and gums normal  Neck:   Supple, symmetrical, trachea midline, no adenopathy;    thyroid:  no enlargement/tenderness/nodules; no carotid   bruit or JVD  Back:     Symmetric, no curvature, ROM normal, no CVA tenderness  Lungs:     Clear to auscultation bilaterally, respirations unlabored  Chest Wall:    No tenderness or deformity   Heart:    Regular rate and rhythm, S1 and S2 normal, no murmur, rub   or gallop  Breast Exam:    No tenderness, masses, or nipple abnormality  Abdomen:     Soft, non-tender, bowel sounds active all four quadrants,    no masses, no organomegaly  Genitalia:    Normal female without lesion, discharge or tenderness  Rectal:    Normal tone, normal prostate, no masses or tenderness;   guaiac negative stool  Extremities:   Extremities normal, atraumatic, no cyanosis or edema  Pulses:   2+ and  symmetric all extremities  Skin:   Skin color, texture, turgor normal, no rashes or lesions  Lymph nodes:   Cervical, supraclavicular, and axillary nodes normal  Neurologic:   CNII-XII intact, normal strength, sensation and reflexes    throughout    question of bruit on the left.    Assessment:     1. Medicare annual wellness visit, subsequent   2. Screening for cardiovascular, respiratory, and genitourinary diseases   3. Breast mass, left   4. Screening for colorectal cancer   5. Gastroesophageal reflux disease, esophagitis presence not specified   6. Essential hypertension   7. Chronic recurrent sinusitis   8. Irritable bowel syndrome with both constipation and diarrhea   9. Osteoporosis with current pathological fracture with nonunion, unspecified osteoporosis type, subsequent encounter   10. Degenerative disc disease, cervical   11. Obesity (BMI 30.0-34.9)   12. Metabolic syndrome X   13. Insomnia,  unspecified type   14. Hyperlipidemia, mild   15. Gout of right foot, unspecified cause, unspecified chronicity   16. Screening for thyroid disorder   17. Elevated blood sugar   18. Encounter for screening mammogram for breast cancer         Plan:  OK TO REFILL ANY OF THE MEDICATIONS X 6 MOS   During the course of the visit the patient was educated and counseled about appropriate screening and preventive services including:    Refer to PT Wants to see neurologist about neck.  - will wait until after referral - was going to get shot's in her neck.  Call when she needs a new referral to [facility] - for dexa and mammogram. Appt w/ Dr. Loletha Carrow scheduled for screening colonoscopy. S/p Hysterectomy for benign indiations.   Orders Placed This Encounter  Procedures  . CBC with Differential/Platelet  . Comprehensive metabolic panel    Order Specific Question:   Has the patient fasted?    Answer:   Yes  . Lipid panel    Order Specific Question:   Has the patient fasted?    Answer:   Yes  . TSH  . Uric acid  . Hemoglobin A1c  . Ambulatory referral to Gastroenterology    Referral Priority:   Routine    Referral Type:   Consultation    Referral Reason:   Specialty Services Required    Number of Visits Requested:   1  . Care order/instruction:    AVS printed - let patient go!  Marland Kitchen POCT urinalysis dipstick    Meds ordered this encounter  Medications  . FLUoxetine (PROZAC) 20 MG capsule    Sig: Take 1 capsule (20 mg total) by mouth daily. To take with the prozac 40mg  to make 60mg  daily    Dispense:  90 capsule    Refill:  1  . ALPRAZolam (XANAX) 0.5 MG tablet    Sig: Take 1 tablet (0.5 mg total) by mouth 3 (three) times daily as needed. for anxiety    Dispense:  90 tablet    Refill:  5  . zolpidem (AMBIEN) 10 MG tablet    Sig: TAKE 1 TABLET BY MOUTH EVERY DAY AT BEDTIME AS NEEDED FOR SLEEP    Dispense:  30  tablet    Refill:  5     [name], M.D.  Primary Care at [facility] [address] [place], [place] [address] [phone] phone [phone] fax  11/20/16 10:45  PM   Patient Instructions (the written plan) was given to the patient.  Medicare Attestation I have personally reviewed: The patient's medical and social history Their use of alcohol, tobacco or illicit drugs Their current medications and supplements The patient's functional ability including ADLs,fall risks, home safety risks, cognitive, and hearing and visual impairment Diet and physical activities Evidence for depression or mood disorders  The patient's weight, height, BMI, and visual acuity have been recorded in the chart.  I have made referrals, counseling, and provided education to the patient based on review of the above and I have provided the patient with a written personalized care plan for preventive services.     [name], MD   10/15/2016

## 2016-10-16 LAB — CBC WITH DIFFERENTIAL/PLATELET
Basophils Absolute: 0 10*3/uL (ref 0.0–0.2)
Basos: 0 %
EOS (ABSOLUTE): 0.1 10*3/uL (ref 0.0–0.4)
Eos: 1 %
HEMOGLOBIN: 12.6 g/dL (ref 11.1–15.9)
Hematocrit: 38.7 % (ref 34.0–46.6)
IMMATURE GRANS (ABS): 0 10*3/uL (ref 0.0–0.1)
Immature Granulocytes: 0 %
LYMPHS: 30 %
Lymphocytes Absolute: 2.3 10*3/uL (ref 0.7–3.1)
MCH: 29.8 pg (ref 26.6–33.0)
MCHC: 32.6 g/dL (ref 31.5–35.7)
MCV: 92 fL (ref 79–97)
MONOCYTES: 9 %
Monocytes Absolute: 0.7 10*3/uL (ref 0.1–0.9)
NEUTROS ABS: 4.5 10*3/uL (ref 1.4–7.0)
Neutrophils: 60 %
PLATELETS: 306 10*3/uL (ref 150–379)
RBC: 4.23 x10E6/uL (ref 3.77–5.28)
RDW: 15.1 % (ref 12.3–15.4)
WBC: 7.6 10*3/uL (ref 3.4–10.8)

## 2016-10-16 LAB — COMPREHENSIVE METABOLIC PANEL
ALBUMIN: 4.2 g/dL (ref 3.6–4.8)
ALK PHOS: 98 IU/L (ref 39–117)
ALT: 18 IU/L (ref 0–32)
AST: 17 IU/L (ref 0–40)
Albumin/Globulin Ratio: 1.9 (ref 1.2–2.2)
BILIRUBIN TOTAL: 0.4 mg/dL (ref 0.0–1.2)
BUN/Creatinine Ratio: 30 — ABNORMAL HIGH (ref 12–28)
BUN: 23 mg/dL (ref 8–27)
CHLORIDE: 98 mmol/L (ref 96–106)
CO2: 24 mmol/L (ref 18–29)
CREATININE: 0.77 mg/dL (ref 0.57–1.00)
Calcium: 9.5 mg/dL (ref 8.7–10.3)
GFR calc Af Amer: 92 mL/min/{1.73_m2} (ref >59)
GFR calc non Af Amer: 80 mL/min/{1.73_m2} (ref >59)
Globulin, Total: 2.2 g/dL (ref 1.5–4.5)
Glucose: 78 mg/dL (ref 65–99)
Potassium: 3.8 mmol/L (ref 3.5–5.2)
Sodium: 139 mmol/L (ref 134–144)
TOTAL PROTEIN: 6.4 g/dL (ref 6.0–8.5)

## 2016-10-16 LAB — TSH: TSH: 3.31 u[IU]/mL (ref 0.450–4.500)

## 2016-10-16 LAB — URIC ACID: Uric Acid: 4.2 mg/dL (ref 2.5–7.1)

## 2016-10-16 LAB — LIPID PANEL
CHOLESTEROL TOTAL: 160 mg/dL (ref 100–199)
Chol/HDL Ratio: 3.5 ratio units (ref 0.0–4.4)
HDL: 46 mg/dL (ref >39)
LDL CALC: 63 mg/dL (ref 0–99)
Triglycerides: 255 mg/dL — ABNORMAL HIGH (ref 0–149)
VLDL CHOLESTEROL CAL: 51 mg/dL — AB (ref 5–40)

## 2016-10-16 LAB — HEMOGLOBIN A1C
Est. average glucose Bld gHb Est-mCnc: 105 mg/dL
Hgb A1c MFr Bld: 5.3 % (ref 4.8–5.6)

## 2016-10-18 ENCOUNTER — Other Ambulatory Visit: Payer: Self-pay | Admitting: Family Medicine

## 2016-10-18 DIAGNOSIS — N6320 Unspecified lump in the left breast, unspecified quadrant: Secondary | ICD-10-CM

## 2016-10-23 ENCOUNTER — Telehealth: Payer: Self-pay | Admitting: Family Medicine

## 2016-10-23 NOTE — Telephone Encounter (Signed)
Received call from [employer] [facility] re: PA for fluoxetine.  They are faxing [place] a form to complete.  As soon as I receive form I will complete it and fax it back.

## 2016-10-23 NOTE — Telephone Encounter (Signed)
Mark called from [employer] for pt needing a prior autho. For alprazolam 0.5 mg

## 2016-10-24 NOTE — Telephone Encounter (Signed)
Fluoxetine does not need PA per insurance, letter will be  scanned in chart

## 2016-10-30 NOTE — Telephone Encounter (Signed)
Received Notice of Denial from Medicare for alprazolam, zolpidem, and ranitidine. All medications because they are not covered under "tier exception"  They did not offer acceptable replacement, however, form does state that they will continue to cover drug at a tier 2 copay.   I notified the patient she can continue to get her medication at the same copay she has been paying.   She verbalized understanding.

## 2016-10-30 NOTE — Telephone Encounter (Signed)
Perfect, thanks 

## 2016-11-21 ENCOUNTER — Other Ambulatory Visit: Payer: Self-pay | Admitting: Family Medicine

## 2016-11-21 DIAGNOSIS — K589 Irritable bowel syndrome without diarrhea: Secondary | ICD-10-CM

## 2016-11-21 DIAGNOSIS — E2839 Other primary ovarian failure: Secondary | ICD-10-CM

## 2016-12-17 ENCOUNTER — Encounter: Payer: Self-pay | Admitting: Family Medicine

## 2016-12-20 ENCOUNTER — Other Ambulatory Visit: Payer: Self-pay | Admitting: Family Medicine

## 2016-12-21 ENCOUNTER — Encounter: Payer: Self-pay | Admitting: Family Medicine

## 2017-01-17 ENCOUNTER — Other Ambulatory Visit: Payer: Self-pay | Admitting: Family Medicine

## 2017-01-23 ENCOUNTER — Other Ambulatory Visit: Payer: Self-pay | Admitting: Family Medicine

## 2017-03-06 ENCOUNTER — Other Ambulatory Visit: Payer: Self-pay | Admitting: Family Medicine

## 2017-04-16 ENCOUNTER — Other Ambulatory Visit: Payer: Self-pay | Admitting: Family Medicine

## 2017-04-20 ENCOUNTER — Other Ambulatory Visit: Payer: Self-pay | Admitting: Family Medicine

## 2017-05-02 NOTE — Telephone Encounter (Signed)
Patient has never returned phone call. Records have been shredded. °

## 2017-05-11 ENCOUNTER — Other Ambulatory Visit: Payer: Self-pay | Admitting: Family Medicine

## 2017-05-13 NOTE — Telephone Encounter (Signed)
Pt has an appt w/ me on 05/24/17 - gets these through [facility] so will send in 1 30d supply only to get her to her visit

## 2017-05-13 NOTE — Telephone Encounter (Signed)
Please advise 

## 2017-05-24 ENCOUNTER — Ambulatory Visit: Payer: Medicare Other | Admitting: Family Medicine

## 2017-05-24 NOTE — Progress Notes (Shared)
Subjective:  By signing my name below, I, Melinda Peterson, attest that this documentation has been prepared under the direction and in the presence of [name], MD. Electronically Signed: Moises Peterson, [place]. 05/24/2017 , 9:51 AM .  Patient was seen in Room *** .   Patient ID: Melinda Peterson, female    DOB: 26-Oct-1950, 66 y.o.   MRN: [id number] No chief complaint on file.  HPI Melinda Peterson is a 66 y.o. female who presents to Primary Care at [facility] here for follow up.   Insomnia Had increased zolpidem from 5mg  to 10mg  at last visit and continued on trazodone 100mg , and melatonin at night along with some other medication because was not sleeping well. At lower dose, she was having dreams of horrific nightmares 1-2x, which is highly unusual for her. She was waking up frequently every hour through the night. She goes to bed at midnight at th latest when she takes her pills around 9:30PM.   Mood Her mood has been about the same, maybe slightly better. She is on Prozac 60mg  with Abilify 5mg  and xanax 0.5mg  TID.   Tobacco cessation She has stopped tobacco use 4 months prior.   GERD She's failed zantac and is on nexium 20mg .   Gout Her uric acid was at goal on allopurinol.   Allergic rhinitis She's doing the nasonex and atrovent with minimal benefit. She is on zyrtec, which is also not working.   Hyperlipidemia After atorvastatin was increased to 40mg , her lipid panel had significantly improved with total cholesterol from 222 to 160.   HTN Chronic tachycardia and elevated BP - white coat. She did see cardiology, Dr. Marlou Porch, in Oct 2016. She is on valsartan-HCTZ 160-25mg  QD.   Back pain She was having more back pain and went to neurologist for her neck.   Osteoporosis She had non-healing navicular stress fracture in May 2016.   Past Medical History:  Diagnosis Date   Allergy    Anxiety    Arthritis    Cataract    Chronic headaches    Colon polyps    Depression     Diverticulosis    Eczema    GERD (gastroesophageal reflux disease)    HTN (hypertension)    Hyperlipidemia    Irritable bowel syndrome    Neuromuscular disorder ([place])    Osteopenia    Osteoporosis    Prior to Admission medications   Medication Sig Start Date End Date Taking? Authorizing Provider  albuterol (PROVENTIL HFA;VENTOLIN HFA) 108 (90 Base) MCG/ACT inhaler Inhale 2 puffs into the lungs every 4 (four) hours as needed for wheezing or shortness of breath (cough, shortness of breath or wheezing.). 09/27/16   Shawnee Knapp, MD  allopurinol (ZYLOPRIM) 300 MG tablet TAKE 1 TABLET(300 MG) BY MOUTH DAILY 01/18/17   Shawnee Knapp, MD  ALPRAZolam [name]) 0.5 MG tablet Take 1 tablet (0.5 mg total) by mouth 3 (three) times daily as needed. for anxiety **Office visit needed for additional refills** 05/13/17   Shawnee Knapp, MD  ARIPiprazole (ABILIFY) 5 MG tablet TAKE 1 TABLET(5 MG) BY MOUTH DAILY 03/07/17   Shawnee Knapp, MD  aspirin 81 MG tablet Take 81 mg by mouth daily.    [provider]  atorvastatin (LIPITOR) 40 MG tablet TAKE 1 TABLET BY MOUTH DAILY AT 6 PM 04/16/17   Shawnee Knapp, MD  benzonatate (TESSALON) 100 MG capsule Take 1-2 capsules (100-200 mg total) by mouth 3 (three) times daily as needed for cough.  09/27/16   Shawnee Knapp, MD  cetirizine (ZYRTEC) 10 MG tablet Take 1 tablet (10 mg total) by mouth at bedtime. 09/27/16   Shawnee Knapp, MD  cyclobenzaprine (FLEXERIL) 10 MG tablet TAKE 1 TABLET(10 MG) BY MOUTH THREE TIMES DAILY AS NEEDED FOR MUSCLE SPASMS 01/24/17   Shawnee Knapp, MD  diclofenac (VOLTAREN) 75 MG EC tablet Take 1 tablet (75 mg total) by mouth 2 (two) times daily as needed for moderate pain. 01/26/16   Shawnee Knapp, MD  esomeprazole (NEXIUM) 20 MG capsule TAKE ONE CAPSULE BY MOUTH TWICE DAILY BEFORE MEALS AS NEEDED FOR ACID REFLUX AND INDIGESTION 03/07/17   Shawnee Knapp, MD  FLUoxetine (PROZAC) 20 MG capsule Take 1 capsule (20 mg total) by mouth daily. To take with the prozac  40mg  to make 60mg  daily 10/15/16   Shawnee Knapp, MD  FLUoxetine (PROZAC) 40 MG capsule Take 1 capsule (40 mg total) by mouth daily. Take with the 20mg  to make 60mg  05/31/16   Shawnee Knapp, MD  ipratropium (ATROVENT) 0.03 % nasal spray Place 2 sprays into the nose 4 (four) times daily. 09/27/16   Shawnee Knapp, MD  Melatonin 10 MG CAPS Take 10 mg by mouth at bedtime.    [provider]  mometasone (NASONEX) 50 MCG/ACT nasal spray Place 2 sprays into the nose at bedtime. 09/27/16   Shawnee Knapp, MD  Multiple Vitamin (MULTIVITAMIN) capsule Take 1 capsule by mouth daily.    [provider]  OVER THE COUNTER MEDICATION OTC Vitamin D 3 2000 mg daily    [provider]  ranitidine (ZANTAC) 150 MG tablet Take 1 tablet (150 mg total) by mouth 2 (two) times daily. Prn heartburn 09/27/16   Shawnee Knapp, MD  SUMAtriptan (IMITREX) 100 MG tablet TAKE 1 TABLET BY MOUTH EVERY 2 HOURS AS NEEDED FOR MIGRAINE 05/13/17   Shawnee Knapp, MD  traZODone (DESYREL) 100 MG tablet TAKE 1 TABLET(100 MG) BY MOUTH AT BEDTIME AS NEEDED FOR SLEEP 04/20/17   Shawnee Knapp, MD  triamcinolone cream (KENALOG) 0.1 % Apply 1 application topically 2 (two) times daily. 05/13/14   Roselee Culver, MD  Turmeric 500 MG TABS Take by mouth.    [provider]  valsartan-hydrochlorothiazide (DIOVAN-HCT) 160-25 MG tablet TAKE 1 TABLET BY MOUTH DAILY 03/07/17   Shawnee Knapp, MD  zolpidem (AMBIEN) 10 MG tablet Take 1 tablet (10 mg total) by mouth at bedtime as needed. for sleep **Office visit needed for any additional refills** 05/13/17   Shawnee Knapp, MD   Allergies  Allergen Reactions   Biaxin [Clarithromycin]    Clarithromycin Hives   Lincomycin Hcl Hives   Lisinopril Swelling   Review of Systems     Objective:   Physical Exam  Constitutional: She is oriented to person, place, and time. She appears well-developed and well-nourished. No distress.  HENT:  Head: Normocephalic and atraumatic.  Eyes: Pupils are  equal, round, and reactive to light. EOM are normal.  Neck: Neck supple.  Cardiovascular: Normal rate.   Pulmonary/Chest: Effort normal. No respiratory distress.  Musculoskeletal: Normal range of motion.  Neurological: She is alert and oriented to person, place, and time.  Skin: Skin is warm and dry.  Psychiatric: She has a normal mood and affect. Her behavior is normal.  Nursing note and vitals reviewed.   There were no vitals taken for this visit.     Assessment & Plan:

## 2017-06-11 ENCOUNTER — Encounter: Payer: Self-pay | Admitting: Family Medicine

## 2017-06-11 ENCOUNTER — Ambulatory Visit (INDEPENDENT_AMBULATORY_CARE_PROVIDER_SITE_OTHER): Payer: Medicare Other | Admitting: Family Medicine

## 2017-06-11 VITALS — BP 118/80 | HR 116 | Temp 98.0°F | Resp 16 | Ht 64.0 in | Wt 199.6 lb

## 2017-06-11 DIAGNOSIS — M419 Scoliosis, unspecified: Secondary | ICD-10-CM

## 2017-06-11 DIAGNOSIS — M40204 Unspecified kyphosis, thoracic region: Secondary | ICD-10-CM | POA: Diagnosis not present

## 2017-06-11 DIAGNOSIS — I10 Essential (primary) hypertension: Secondary | ICD-10-CM

## 2017-06-11 DIAGNOSIS — M5030 Other cervical disc degeneration, unspecified cervical region: Secondary | ICD-10-CM

## 2017-06-11 MED ORDER — CYCLOBENZAPRINE HCL 10 MG PO TABS: ORAL_TABLET | ORAL | 2 refills | Status: DC

## 2017-06-11 MED ORDER — TRAZODONE HCL 100 MG PO TABS: ORAL_TABLET | ORAL | 3 refills | Status: DC

## 2017-06-11 MED ORDER — SUMATRIPTAN SUCCINATE 100 MG PO TABS: ORAL_TABLET | ORAL | 2 refills | Status: DC

## 2017-06-11 MED ORDER — ALPRAZOLAM 0.5 MG PO TABS: 0.5000 mg | ORAL_TABLET | Freq: Three times a day (TID) | ORAL | 2 refills | Status: DC | PRN

## 2017-06-11 MED ORDER — DICLOFENAC SODIUM 75 MG PO TBEC
75.0000 mg | DELAYED_RELEASE_TABLET | Freq: Two times a day (BID) | ORAL | 2 refills | Status: DC | PRN
Start: 2017-06-11 — End: 2017-12-23

## 2017-06-11 MED ORDER — FLUOXETINE HCL 40 MG PO CAPS: 40.0000 mg | ORAL_CAPSULE | Freq: Every day | ORAL | 1 refills | Status: DC

## 2017-06-11 MED ORDER — ZOLPIDEM TARTRATE 10 MG PO TABS: 10.0000 mg | ORAL_TABLET | Freq: Every evening | ORAL | 2 refills | Status: DC | PRN

## 2017-06-11 MED ORDER — BUPROPION HCL 100 MG PO TABS: 100.0000 mg | ORAL_TABLET | ORAL | 2 refills | Status: DC

## 2017-06-11 NOTE — Patient Instructions (Addendum)
You need to schedule your mammogram and your DEXA bone scan.  Please call [place] at [phone] to schedule.  Call [facility] to schedule you colonoscopy. [phone]    IF you received an x-ray today, you will receive an invoice from [facility]. Please contact [facility] at [phone] with questions or concerns regarding your invoice.   IF you received labwork today, you will receive an invoice from [place]. Please contact [facility] at [phone] with questions or concerns regarding your invoice.   Our billing staff will not be able to assist you with questions regarding bills from these companies.  You will be contacted with the lab results as soon as they are available. The fastest way to get your results is to activate your My Chart account. Instructions are located on the last page of this paperwork. If you have not heard from [place] regarding the results in 2 weeks, please contact this office.      Persistent Depressive Disorder, Adult Persistent depressive disorder (PDD) is a mental health condition that causes symptoms of low-level depression for 2 years or longer. It may also be called long-term (chronic) depression or dysthymia. PDD may include episodes of more severe depression that last for about 2 weeks (major depressive disorder or MDD). PDD can affect the way you think, feel, and sleep. This condition may also affect your relationships. You may be more likely to get sick if you have PDD. What are the causes? The exact cause of this condition is not known. PDD is most likely caused by a combination of things, which may include:  Genetic factors. These are traits that are passed along from parent to child.  Individual factors. Your personality, your behavior, and the way you handle your thoughts and feelings may contribute to PDD. This includes personality traits and behaviors learned from others.  Physical factors, such  as: ? Differences in the part of your brain that controls emotion. This part of your brain may be different than it is in people who do not have PDD. ? Long-term (chronic) medical or psychiatric illnesses.  Social factors. Traumatic experiences or major life changes may play a role in the development of PDD.  What increases the risk? This condition is more likely to develop in women. The following factors may make you more likely to develop PDD:  A family history of depression.  Abnormally low levels of certain brain chemicals.  Traumatic events in childhood, especially abuse or the loss of a parent.  Being under a lot of stress, or long-term stress, especially from upsetting life experiences or losses.  A history of: ? Chronic physical illness. ? Other mental health disorders. ? Substance abuse.  Poor living conditions.  Experiencing social exclusion or discrimination on a regular basis.  What are the signs or symptoms? Symptoms of this condition occur for most of the day, and may include:  Fatigue or low energy.  Eating too much or too little.  Sleeping too much or too little.  Restlessness or agitation.  Feelings of hopelessness.  Feeling worthless or guilty.  Anxiety.  Poor concentration or difficulty making decisions.  Low self-esteem.  Negative outlook.  Inability to have fun or experience pleasure.  Social withdrawal.  Unexplained physical complaints.  Irritability.  Aggressive behavior or anger.  How is this diagnosed? This condition may be diagnosed based on:  Your symptoms.  Your medical history, including your mental health history. This may involve tests to evaluate your mental  health. You may be asked questions about your lifestyle, including any drug and alcohol use, and how long you have had symptoms of PDD.  A physical exam.  Blood tests to rule out other conditions.  You may be diagnosed with PDD if you have had a depressed mood  for 2 years or longer, as well as other symptoms of depression. How is this treated? This condition is usually treated by mental health professionals, such as psychologists, psychiatrists, and clinical social workers. You may need more than one type of treatment. Treatment may include:  Psychotherapy. This is also called talk therapy or counseling. Types of psychotherapy include: ? Cognitive behavioral therapy (CBT). This type of therapy teaches you to recognize unhealthy feelings, thoughts, and behaviors, and replace them with positive thoughts and actions. ? Interpersonal therapy (IPT). This helps you to improve the way you relate to and communicate with others. ? Family therapy. This treatment includes members of your family.  Medicine to treat anxiety and depression, or to help you control certain emotions and behaviors.  Lifestyle changes, such as: ? Limiting alcohol and drug use. ? Exercising regularly. ? Getting plenty of sleep. ? Making healthy eating choices. ? Spending more time outdoors.  Follow these instructions at home: Activity  Return to your normal activities as told by your health care provider.  Exercise regularly and spend time outdoors as told by your health care provider. General instructions  Take over-the-counter and prescription medicines only as told by your health care provider.  Do not drink alcohol. If you drink alcohol, limit your alcohol intake to no more than 1 drink a day for nonpregnant women and 2 drinks a day for men. One drink equals 12 oz of beer, 5 oz of wine, or 1 oz of hard liquor. Alcohol can affect any antidepressant medicines you are taking. Talk to your health care provider about your alcohol use.  Eat a healthy diet and get plenty of sleep.  Find activities that you enjoy doing, and make time to do them.  Consider joining a support group. Your health care provider may be able to recommend a support group.  Keep all follow-up visits as  told by your health care provider. This is important. Where to find more information: [employer] on Mental Illness  www.nami.org  U.S. National Institute of Mental Health  https://carter.com/  National Suicide Prevention Lifeline  1-800-273-TALK 256-729-6887). This is free, 24-hour help.  Contact a health care provider if:  Your symptoms get worse.  You develop new symptoms.  You have trouble sleeping or doing your daily activities. Get help right away if:  You self-harm.  You have serious thoughts about hurting yourself or others.  You see, hear, taste, smell, or feel things that are not present (hallucinate). This information is not intended to replace advice given to you by your health care provider. Make sure you discuss any questions you have with your health care provider. Document Released: 07/16/2012 Document Revised: 03/29/2016 Document Reviewed: 02/11/2016 Elsevier Interactive Patient Education  2017 [employer].

## 2017-06-11 NOTE — Progress Notes (Signed)
Subjective:    Patient ID: Melinda Peterson, female    DOB: [birth year], 66 y.o.   MRN: [id number] Chief Complaint  Patient presents with  . Medication Refill    xanax, fluoxetine,40 mg, 20 mg , ambien    HPI  Melinda Peterson is a 66 yo woman who is here to follow-up on recent testing and med changes. She hs a chest CT scheduled for today at 1 pm to recheck on small lung nodule.  Insomnia: She is taking the zolpidem 5 mg (she is cutting the 10mg  in half), trazodone 100mg , and melatonin at night along with some other medication.She has not slept, she has developed dreams of horrific nightmares 1-2x which is highly unusual for her.  She has been waking up frequently every hour through the night.  She goes to bed at midnight at the latest when she takes her pills around 9:30 pm.   She is currently sleeping very well on the zolpidem 10mg .   Mood still w/ about same anhedonia. She has not done anything due to mood - no colonoscopy, mam, dexa. She was taking the abilify at night and switched it to the morning which has helped decrease some of the daytime fatigue.  She will occasionally take a short nap during the day but really is up more and active during the day trying to catch up on things.  She is wearing a step tracker to measure her daily activity around the house. Denies medication side effects.  She is taking the fluoxetine 60 mg (a 40 and 20mg ) but ran out of the 40mg  5d ago. She did see psychologist 5-6 yrs ago which helped but felt like she maxed the benefit from this.  She is in a good place with her husband - she is sick of the way she is living - prob is they both by to much. Has a lot of worries about bills. Husband does try to get her out of the house on weekends. Has some exercise things but can't use them the way her house is set up.   QUIT SMOKING!!! BUt has been eating more so gained a lot of weight she would like to loose.  GERD: the zantac did not work at all. Taking probiotics which is helping  sig.  Allergic rhinitis: doing the nasonex and atrovent with minimal benefit. Sneezing. Not on antihistamine but is taking mucinex.  HLD: She increased her atorvastatin to 40mg  2 wks ago when she was last seen.  Not fasting today.  HTN: Chronic tachycardia and elev BP - white coat. Not checking BP outside of the office. Did see cardiology 05/2015 Dr. Marlou Porch. On valsartan-hctz 160-25.   Neck pain has gotten better since last visit but still thinks she needs to go see neurologist. Never went to see PT for the scoliosis causing back pain but would still like to do that.  Having more back pain. Needs to go to a neurologist for her neck.  Osteoporosis 5/16 non-healing navicular stress fracture  Refer to PT Wants to see neurologist about neck.  - will wait until after referral - was going to get shot's in her neck.  Call when she needs a new referral to [facility] - for dexa and mammogram. Appt w/ Dr. Loletha Carrow scheduled for screening colonoscopy.  Past Medical History:  Diagnosis Date  . Allergy   . Anxiety   . Arthritis   . Cataract   .  Chronic headaches   . Colon polyps   . Depression   . Diverticulosis   . Eczema   . GERD (gastroesophageal reflux disease)   . HTN (hypertension)   . Hyperlipidemia   . Irritable bowel syndrome   . Neuromuscular disorder ([place])   . Osteopenia   . Osteoporosis    Past Surgical History:  Procedure Laterality Date  . acromioclavicularplasty    . BREAST SURGERY Left   . CATARACT EXTRACTION, BILATERAL  2016  . COLONOSCOPY W/ POLYPECTOMY  2009, 2012  . SKIN CANCER EXCISION     lower back  . TOTAL ABDOMINAL HYSTERECTOMY    . TUBAL LIGATION     Current Outpatient Prescriptions on File Prior to Visit  Medication Sig Dispense Refill  . albuterol (PROVENTIL HFA;VENTOLIN HFA) 108 (90 Base) MCG/ACT inhaler Inhale 2 puffs into the lungs every 4 (four) hours as needed for wheezing or  shortness of breath (cough, shortness of breath or wheezing.). 1 Inhaler 1  . allopurinol (ZYLOPRIM) 300 MG tablet TAKE 1 TABLET(300 MG) BY MOUTH DAILY 90 tablet 2  . ARIPiprazole (ABILIFY) 5 MG tablet TAKE 1 TABLET(5 MG) BY MOUTH DAILY 90 tablet 1  . aspirin 81 MG tablet Take 81 mg by mouth daily.    Marland Kitchen atorvastatin (LIPITOR) 40 MG tablet TAKE 1 TABLET BY MOUTH DAILY AT 6 PM 90 tablet 1  . benzonatate (TESSALON) 100 MG capsule Take 1-2 capsules (100-200 mg total) by mouth 3 (three) times daily as needed for cough. 60 capsule 1  . cetirizine (ZYRTEC) 10 MG tablet Take 1 tablet (10 mg total) by mouth at bedtime. 30 tablet 11  . esomeprazole (NEXIUM) 20 MG capsule TAKE ONE CAPSULE BY MOUTH TWICE DAILY BEFORE MEALS AS NEEDED FOR ACID REFLUX AND INDIGESTION 180 capsule 1  . ipratropium (ATROVENT) 0.03 % nasal spray Place 2 sprays into the nose 4 (four) times daily. 30 mL 1  . Melatonin 10 MG CAPS Take 10 mg by mouth at bedtime.    . mometasone (NASONEX) 50 MCG/ACT nasal spray Place 2 sprays into the nose at bedtime. 17 g 1  . Multiple Vitamin (MULTIVITAMIN) capsule Take 1 capsule by mouth daily.    Marland Kitchen OVER THE COUNTER MEDICATION OTC Vitamin D 3 2000 mg daily    . ranitidine (ZANTAC) 150 MG tablet Take 1 tablet (150 mg total) by mouth 2 (two) times daily. Prn heartburn 60 tablet 0  . triamcinolone cream (KENALOG) 0.1 % Apply 1 application topically 2 (two) times daily. 30 g 0  . Turmeric 500 MG TABS Take by mouth.    . valsartan-hydrochlorothiazide (DIOVAN-HCT) 160-25 MG tablet TAKE 1 TABLET BY MOUTH DAILY 90 tablet 1   No current facility-administered medications on file prior to visit.    Allergies  Allergen Reactions  . Biaxin [Clarithromycin]   . Clarithromycin Hives  . Lincomycin Hcl Hives  . Lisinopril Swelling   Family History  Problem Relation Age of Onset  . Colon polyps Father   . Colon polyps Brother   . Heart disease Mother   . Stroke Mother   . Mental illness Sister         Personality disorder  . Depression Sister   . Valvular heart disease Sister    Social History   Social History  . Marital status: Married    Spouse name: N/A  . Number of children: 1  . Years of education: N/A   Occupational History  . retired    Social History  Main Topics  . Smoking status: Former Smoker    Packs/day: 0.30    Types: Cigarettes  . Smokeless tobacco: Never Used  . Alcohol use Yes     Comment: social  . Drug use: No  . Sexual activity: Yes    Partners: Male   Other Topics Concern  . 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   Depression screen [facility] 2/9 09/27/2016 05/31/2016 01/26/2016 09/22/2015 04/14/2015  Decreased Interest 0 0 0 0 0  Down, Depressed, Hopeless 0 0 0 0 0  PHQ - 2 Score 0 0 0 0 0    Review of Systems See hpi    Objective:   Physical Exam  Constitutional: She is oriented to person, place, and time. She appears well-developed and well-nourished. No distress.  HENT:  Head: Normocephalic and atraumatic.  Right Ear: External ear normal.  Left Ear: External ear normal.  Eyes: Conjunctivae are normal. No scleral icterus.  Neck: Normal range of motion. Neck supple. No thyromegaly present.  Cardiovascular: Normal rate, regular rhythm, normal heart sounds and intact distal pulses.   Pulmonary/Chest: Effort normal and breath sounds normal. No respiratory distress.  Musculoskeletal: She exhibits no edema.  Lymphadenopathy:    She has no cervical adenopathy.  Neurological: She is alert and oriented to person, place, and time.  Skin: Skin is warm and dry. She is not diaphoretic. No erythema.  Psychiatric: She has a normal mood and affect. Her behavior is normal.      BP 118/80   Pulse (!) 116   Temp 98 F (36.7 C)   Resp 16   Ht 5\' 4"  (1.626 m)   Wt 199 lb 9.6 oz (90.5 kg)   SpO2 98%   BMI 34.26 kg/m      Assessment & Plan:  Has AWV scheduled 10/15/16 - is NOT fasting today.  \ Flp, uric acid, cmp Cbc? Tsh? She will need a refill of her xanax at next visit. Ok to increase the zolpidem up to 10mg .  onlhy had a 6 mo supply 1 yr ago on prozac 40  Would like to [facility] on [address].   1. Essential hypertension   2. Degenerative disc disease, cervical   3. Scoliosis of thoracic spine, unspecified scoliosis type   4. Kyphosis of thoracic region, unspecified kyphosis type     Orders Placed This Encounter  Procedures  . Ambulatory referral to Physical Therapy    Referral Priority:   Routine    Referral Type:   Physical Medicine    Referral Reason:   Specialty Services Required    Requested Specialty:   Physical Therapy    Number of Visits Requested:   1    Meds ordered this encounter  Medications  . buPROPion (WELLBUTRIN) 100 MG tablet    Sig: Take 1 tablet (100 mg total) by mouth every morning.    Dispense:  30 tablet    Refill:  2  . ALPRAZolam (XANAX) 0.5 MG tablet    Sig: Take 1 tablet (0.5 mg total) by mouth 3 (three) times daily as needed.    Dispense:  90 tablet    Refill:  2    To [facility] at [address] [phone]  . cyclobenzaprine (FLEXERIL) 10 MG tablet    Sig: TAKE 1 TABLET(10 MG) BY MOUTH THREE TIMES DAILY AS NEEDED FOR MUSCLE SPASMS    Dispense:  60 tablet    Refill:  2  . diclofenac (VOLTAREN) 75 MG EC tablet    Sig: Take 1 tablet (75 mg total) by mouth 2 (two) times daily as needed for moderate pain.    Dispense:  60 tablet    Refill:  2  . FLUoxetine (PROZAC) 40 MG capsule    Sig: Take 1 capsule (40 mg total) by mouth daily. Take with the 20mg  to make 60mg     Dispense:  90 capsule    Refill:  1  . SUMAtriptan (IMITREX) 100 MG tablet    Sig: TAKE 1 TABLET BY MOUTH EVERY 2 HOURS AS NEEDED FOR MIGRAINE    Dispense:  10 tablet    Refill:  2  . traZODone (DESYREL) 100 MG tablet    Sig: TAKE 1 TABLET(100 MG) BY MOUTH AT BEDTIME AS NEEDED FOR SLEEP    Dispense:  90 tablet    Refill:  3  . zolpidem (AMBIEN) 10 MG tablet     Sig: Take 1 tablet (10 mg total) by mouth at bedtime as needed. for sleep    Dispense:  30 tablet    Refill:  2    To [facility] at [address] [phone]     [name], M.D.  Primary Care at [facility] [address] [place], [place] [address] [phone] phone [phone] fax  06/11/17 11:55 AM

## 2017-06-19 NOTE — Telephone Encounter (Signed)
done

## 2017-07-15 ENCOUNTER — Other Ambulatory Visit: Payer: Self-pay | Admitting: Family Medicine

## 2017-09-13 ENCOUNTER — Other Ambulatory Visit: Payer: Self-pay | Admitting: Family Medicine

## 2017-09-13 NOTE — Telephone Encounter (Signed)
Valsartan LR: 03/07/17 #90 1 RF      LOV: 06/11/17  Allopurinol LR: 01/18/17 #90 2 RF       LOV: 06/11/17   Bupropion LR: 06/11/17  #30 2 RF   LOV: 06/11/17   Alprazolam  LR: 06/11/17 #90 2 RF  LOV:06/11/17   Zolpidem LR: 06/11/17 #30 2 RF   LOV: 06/11/17  Has upcoming apt 09/30/17

## 2017-09-16 ENCOUNTER — Other Ambulatory Visit: Payer: Self-pay | Admitting: Family Medicine

## 2017-09-16 ENCOUNTER — Ambulatory Visit: Payer: Medicare Other | Admitting: Family Medicine

## 2017-09-16 NOTE — Telephone Encounter (Signed)
Wellbutrin refill request Last OV 06/11/17 Next appt 09/30/17

## 2017-09-20 NOTE — Telephone Encounter (Signed)
Refills for zolpidem and xanax x 1 mo sent to pharmacy. Will see pt in OV later this month for additional refills.

## 2017-09-26 ENCOUNTER — Ambulatory Visit: Payer: Medicare Other | Admitting: Family Medicine

## 2017-09-30 ENCOUNTER — Other Ambulatory Visit: Payer: Self-pay | Admitting: Family Medicine

## 2017-09-30 ENCOUNTER — Encounter: Payer: Self-pay | Admitting: Family Medicine

## 2017-09-30 ENCOUNTER — Ambulatory Visit (INDEPENDENT_AMBULATORY_CARE_PROVIDER_SITE_OTHER): Payer: Medicare Other | Admitting: Family Medicine

## 2017-09-30 ENCOUNTER — Telehealth: Payer: Self-pay | Admitting: Family Medicine

## 2017-09-30 ENCOUNTER — Ambulatory Visit (INDEPENDENT_AMBULATORY_CARE_PROVIDER_SITE_OTHER): Payer: Medicare Other

## 2017-09-30 ENCOUNTER — Telehealth: Payer: Self-pay

## 2017-09-30 ENCOUNTER — Other Ambulatory Visit: Payer: Self-pay

## 2017-09-30 VITALS — BP 141/82 | HR 92 | Temp 98.0°F | Resp 16 | Ht 64.0 in | Wt 210.2 lb

## 2017-09-30 DIAGNOSIS — E8881 Metabolic syndrome: Secondary | ICD-10-CM

## 2017-09-30 DIAGNOSIS — R002 Palpitations: Secondary | ICD-10-CM | POA: Diagnosis not present

## 2017-09-30 DIAGNOSIS — R059 Cough, unspecified: Secondary | ICD-10-CM

## 2017-09-30 DIAGNOSIS — F419 Anxiety disorder, unspecified: Secondary | ICD-10-CM | POA: Diagnosis not present

## 2017-09-30 DIAGNOSIS — M109 Gout, unspecified: Secondary | ICD-10-CM

## 2017-09-30 DIAGNOSIS — R609 Edema, unspecified: Secondary | ICD-10-CM

## 2017-09-30 DIAGNOSIS — R911 Solitary pulmonary nodule: Secondary | ICD-10-CM | POA: Diagnosis not present

## 2017-09-30 DIAGNOSIS — Z5181 Encounter for therapeutic drug level monitoring: Secondary | ICD-10-CM

## 2017-09-30 DIAGNOSIS — R05 Cough: Secondary | ICD-10-CM

## 2017-09-30 DIAGNOSIS — R600 Localized edema: Secondary | ICD-10-CM

## 2017-09-30 DIAGNOSIS — E669 Obesity, unspecified: Secondary | ICD-10-CM

## 2017-09-30 DIAGNOSIS — R0602 Shortness of breath: Secondary | ICD-10-CM | POA: Diagnosis not present

## 2017-09-30 DIAGNOSIS — F3341 Major depressive disorder, recurrent, in partial remission: Secondary | ICD-10-CM

## 2017-09-30 DIAGNOSIS — I10 Essential (primary) hypertension: Secondary | ICD-10-CM | POA: Diagnosis not present

## 2017-09-30 DIAGNOSIS — G4700 Insomnia, unspecified: Secondary | ICD-10-CM

## 2017-09-30 DIAGNOSIS — IMO0001 Reserved for inherently not codable concepts without codable children: Secondary | ICD-10-CM

## 2017-09-30 DIAGNOSIS — E66811 Obesity, class 1: Secondary | ICD-10-CM

## 2017-09-30 DIAGNOSIS — E785 Hyperlipidemia, unspecified: Secondary | ICD-10-CM | POA: Diagnosis not present

## 2017-09-30 LAB — D-DIMER, QUANTITATIVE: D-DIMER: 0.6 mg/L FEU — ABNORMAL HIGH (ref 0.00–0.49)

## 2017-09-30 MED ORDER — FUROSEMIDE 20 MG PO TABS: 20.0000 mg | ORAL_TABLET | Freq: Every day | ORAL | 0 refills | Status: DC | PRN

## 2017-09-30 MED ORDER — ALPRAZOLAM 0.5 MG PO TABS: 0.5000 mg | ORAL_TABLET | Freq: Three times a day (TID) | ORAL | 2 refills | Status: DC | PRN

## 2017-09-30 NOTE — Telephone Encounter (Signed)
TC to Vascular.  Stat bilateral venous doppler scheduled for 02/19 at 11:00.  Call to pt.  Advised of appt.

## 2017-09-30 NOTE — Progress Notes (Addendum)
Subjective:  This chart was scribed for Shawnee Knapp, MD by Tamsen Roers, at [place] at [facility].  This patient was seen in room 2 and the patient's care was started at 11:14 AM.   Chief Complaint  Patient presents with  . Follow-up     Patient ID: Melinda Peterson, female    DOB: 06-13-1951, 67 y.o.   MRN: [id number]  HPI HPI Comments: Melinda Peterson is a 67 y.o. female who presents to Primary Care at [facility] for a follow up.   HTN: She does not check her blood pressure outside of the office. Patient denies any urinary frequency.    H/o tobacco use: still quit!  She has not been smoking for the past eight months. Patient started smoking when she was 74, smokes less than half a pack per day.   Chronic insomnia: Patient has not been taking Ambien since February 1st as she had run out and could not get it refilled. She has been taking Xanax in order to sleep since then.   Depression: She takes Abilify in the mornings.   B lower ext edema: About five days ago, patient was on a flight and noticed swelling in her legs bilaterally. The swelling has gone down since then but she has been having some shortness of breath/fatigue.  She has associated symptoms of some palpitations- noticed last night.  She has an intermittent dry cough and some rhinorrhea as well. Denies any redness/ bruising/heat on her legs. She did have a fall that resulted in a bruise on her right knee and left ankle but this happened after the fall.   Patient takes her baby aspirin daily.    Patients father died yesterday so her and her husband Fritz Pickerel are leaving to drive up [place] in several days for the funeral.  Did get to say goodbye to him on the phone   Past Medical History:  Diagnosis Date  . Allergy   . Anxiety   . Arthritis   . Cataract   . Chronic headaches   . Colon polyps   . Depression   . Diverticulosis   . Eczema   . GERD (gastroesophageal reflux disease)   . HTN (hypertension)   . Hyperlipidemia    . Irritable bowel syndrome   . Neuromuscular disorder ([place])   . Osteopenia   . Osteoporosis     Current Outpatient Medications on File Prior to Visit  Medication Sig Dispense Refill  . albuterol (PROVENTIL HFA;VENTOLIN HFA) 108 (90 Base) MCG/ACT inhaler Inhale 2 puffs into the lungs every 4 (four) hours as needed for wheezing or shortness of breath (cough, shortness of breath or wheezing.). 1 Inhaler 1  . allopurinol (ZYLOPRIM) 300 MG tablet Take 1 tablet (300 mg total) by mouth daily. Office visit needed 90 tablet 0  . ALPRAZolam (XANAX) 0.5 MG tablet TAKE 1 TABLET BY MOUTH THREE TIMES DAILY AS NEEDED 90 tablet 0  . ARIPiprazole (ABILIFY) 5 MG tablet TAKE 1 TABLET(5 MG) BY MOUTH DAILY 90 tablet 1  . aspirin 81 MG tablet Take 81 mg by mouth daily.    Marland Kitchen atorvastatin (LIPITOR) 40 MG tablet TAKE 1 TABLET BY MOUTH DAILY AT 6 PM 90 tablet 1  . benzonatate (TESSALON) 100 MG capsule Take 1-2 capsules (100-200 mg total) by mouth 3 (three) times daily as needed for cough. 60 capsule 1  . buPROPion (WELLBUTRIN) 100 MG tablet TAKE 1 TABLET(100 MG) BY MOUTH EVERY MORNING 30 tablet 0  . cetirizine (ZYRTEC) 10  MG tablet Take 1 tablet (10 mg total) by mouth at bedtime. 30 tablet 11  . cyclobenzaprine (FLEXERIL) 10 MG tablet TAKE 1 TABLET(10 MG) BY MOUTH THREE TIMES DAILY AS NEEDED FOR MUSCLE SPASMS 60 tablet 2  . diclofenac (VOLTAREN) 75 MG EC tablet Take 1 tablet (75 mg total) by mouth 2 (two) times daily as needed for moderate pain. 60 tablet 2  . esomeprazole (NEXIUM) 20 MG capsule TAKE ONE CAPSULE BY MOUTH TWICE DAILY BEFORE MEALS AS NEEDED FOR ACID REFLUX AND INDIGESTION 180 capsule 1  . FLUoxetine (PROZAC) 40 MG capsule Take 1 capsule (40 mg total) by mouth daily. Take with the 20mg  to make 60mg  90 capsule 1  . ipratropium (ATROVENT) 0.03 % nasal spray Place 2 sprays into the nose 4 (four) times daily. 30 mL 1  . Melatonin 10 MG CAPS Take 10 mg by mouth at bedtime.    . mometasone (NASONEX) 50  MCG/ACT nasal spray Place 2 sprays into the nose at bedtime. 17 g 1  . Multiple Vitamin (MULTIVITAMIN) capsule Take 1 capsule by mouth daily.    Marland Kitchen OVER THE COUNTER MEDICATION OTC Vitamin D 3 2000 mg daily    . ranitidine (ZANTAC) 150 MG tablet Take 1 tablet (150 mg total) by mouth 2 (two) times daily. Prn heartburn 60 tablet 0  . SUMAtriptan (IMITREX) 100 MG tablet TAKE 1 TABLET BY MOUTH EVERY 2 HOURS AS NEEDED FOR MIGRAINE 10 tablet 2  . traZODone (DESYREL) 100 MG tablet TAKE 1 TABLET(100 MG) BY MOUTH AT BEDTIME AS NEEDED FOR SLEEP 90 tablet 3  . triamcinolone cream (KENALOG) 0.1 % Apply 1 application topically 2 (two) times daily. 30 g 0  . Turmeric 500 MG TABS Take by mouth.    . valsartan-hydrochlorothiazide (DIOVAN-HCT) 160-25 MG tablet TAKE 1 TABLET BY MOUTH DAILY 90 tablet 0  . zolpidem (AMBIEN) 10 MG tablet TAKE 1 TABLET BY MOUTH AT BEDTIME AS NEEDED FOR SLEEP 30 tablet 0   No current facility-administered medications on file prior to visit.     Allergies  Allergen Reactions  . Biaxin [Clarithromycin]   . Clarithromycin Hives  . Lincomycin Hcl Hives  . Lisinopril Swelling   Past Surgical History:  Procedure Laterality Date  . acromioclavicularplasty    . BREAST SURGERY Left   . CATARACT EXTRACTION, BILATERAL  2016  . COLONOSCOPY W/ POLYPECTOMY  2009, 2012  . SKIN CANCER EXCISION     lower back  . TOTAL ABDOMINAL HYSTERECTOMY    . TUBAL LIGATION     Family History  Problem Relation Age of Onset  . Colon polyps Father   . Colon polyps Brother   . Heart disease Mother   . Stroke Mother   . Mental illness Sister        Personality disorder  . Depression Sister   . Valvular heart disease Sister    Social History   Socioeconomic History  . Marital status: Married    Spouse name: None  . Number of children: 1  . Years of education: None  . Highest education level: None  Social Needs  . Financial resource strain: None  . Food insecurity - worry: None  . Food  insecurity - inability: None  . Transportation needs - medical: None  . Transportation needs - non-medical: None  Occupational History  . Occupation: retired  Tobacco Use  . Smoking status: Former Smoker    Packs/day: 0.30    Types: Cigarettes  . Smokeless tobacco: Never Used  Substance and Sexual Activity  . Alcohol use: Yes    Comment: social  . Drug use: No  . Sexual activity: Yes    Partners: Male  Other Topics Concern  . None  Social History Narrative   Married. Patient has a Financial risk analyst. She does not exercise.   Depression screen [facility] 2/9 09/30/2017 09/27/2016 05/31/2016 01/26/2016 09/22/2015  Decreased Interest 0 0 0 0 0  Down, Depressed, Hopeless 0 0 0 0 0  PHQ - 2 Score 0 0 0 0 0    Review of Systems  Constitutional: Positive for fatigue. Negative for chills and fever.  HENT: Positive for rhinorrhea. Negative for sneezing and sore throat.   Eyes: Negative for pain, redness and itching.  Respiratory: Positive for cough and shortness of breath. Negative for choking.   Cardiovascular: Positive for palpitations and leg swelling.  Gastrointestinal: Negative for nausea and vomiting.  Genitourinary: Negative for frequency.  Neurological: Negative for speech difficulty.      Objective:   Physical Exam  Constitutional: She is oriented to person, place, and time. She appears well-developed and well-nourished. No distress.  HENT:  Head: Normocephalic and atraumatic.  Right Ear: External ear normal.  Left Ear: External ear normal.  Eyes: Conjunctivae are normal. No scleral icterus.  Neck: Normal range of motion. Neck supple. No thyromegaly present.  Cardiovascular: Normal rate, regular rhythm, S1 normal, S2 normal, normal heart sounds and intact distal pulses.  Pulses:      Dorsalis pedis pulses are 2+ on the right side, and 2+ on the left side.  Posterior tibial pulses not palpable, trace lower extremity edema bilaterally. Calf circumferance equal B. No cords, neg  Homans.  Pulmonary/Chest: Effort normal and breath sounds normal. No respiratory distress.  Lungs clear.   Musculoskeletal: She exhibits no edema.  Lymphadenopathy:    She has no cervical adenopathy.  Neurological: She is alert and oriented to person, place, and time.  Skin: Skin is warm and dry. She is not diaphoretic. No erythema.   no lower ext erythema.   Few healing bruises over knees  Psychiatric: Her behavior is normal. Her affect is blunt.   EKG compared to 01-26-16: shows new QS complex in lead 3. Loss of S wave in lead AVL.  Depression of Voltage. Some minimal change on lateral chest leads.  Agree with  Computer read and no ischemic change.   Vitals:   09/30/17 1025  BP: (!) 141/82  Pulse: 92  Resp: 16  Temp: 98 F (36.7 C)  SpO2: 95%  Weight: 210 lb 3.2 oz (95.3 kg)  Height: 5\' 4"  (1.626 m)    Dg Chest 2 View  Result Date: 09/30/2017 CLINICAL DATA:  67 year old female with history of shortness of breath. Fluid retention after a recent flight. EXAM: CHEST  2 VIEW COMPARISON:  Chest x-ray 03/26/2015. FINDINGS: Lung volumes are normal. No consolidative airspace disease. No pleural effusions. No pneumothorax. No pulmonary nodule or mass noted. Pulmonary vasculature and the cardiomediastinal silhouette are within normal limits. IMPRESSION: No radiographic evidence of acute cardiopulmonary disease. Electronically Signed   By: Vinnie Langton M.D.   On: 09/30/2017 12:03        Assessment & Plan:   1. Essential hypertension   2. Gout of right foot, unspecified cause, unspecified chronicity - no flairs while on allopurinol 300 - uric acid low - ok to refill x 1 yr when requested  3. Recurrent major depressive disorder, in partial remission (HCC) - cont abilify 5 (will  try changing it to evenings), wellbutrin 100 qam, and prozac 40  4. Obesity (BMI 30.0-34.9)   5. Anxiety - dependent upon alprazolam 0.5 tid - recheck in 3 mos for refills.  6. Metabolic syndrome X - can't get  medicare to cover a1c  7. Lung nodule < 6cm on CT - no further f/u needed, appeared to be benign. Pt is under the 30 pack yr cut off so does not qualify for the screening lung LDCT scan  8. Insomnia, unspecified type - has done fine on 1mg  alprazolam qhs when she ran out of zolpidem 10. Cont trazodone 50. Failed zolpidem 5.  Recheck in 3 mos for any additional refills  9. Medication monitoring encounter   10. Cough   11. Palpitations   12. Shortness of breath   13. Peripheral edema - suspect this was dependent edema while on the plane since bilateral, no other alarm lower ext/DVT sxs, and has resolved. However, pt does endorse potential PE sxs since she had the severe lower ext swelling with ShoB periodic at rest, new occ cough, palpitations last night. No PE concerns from CXR or EKG. Will r/u with d-dimer. Pt understands that if the d-dimer is +, she needs to go for B lower ext venous dopplers immed. Buy lower extremity compression socks and gave rx for prn lasix 20 in case sxs recur during car trip.   Ok to refill cyclobenzaprine, diclofenac, esomeprazole, zantac, imitrex, and valsartan-hctz all x 3 mos when requested  Orders Placed This Encounter  Procedures  . DG Chest 2 View    Standing Status:   Future    Number of Occurrences:   1    Standing Expiration Date:   09/30/2018    Order Specific Question:   Reason for Exam (SYMPTOM  OR DIAGNOSIS REQUIRED)    Answer:   SHoB, fluid retention after flight    Order Specific Question:   Preferred imaging location?    Answer:   External  . US Venous Img Lower Bilateral    Standing Status:   Future    Standing Expiration Date:   11/29/2018    Order Specific Question:   Reason for Exam (SYMPTOM  OR DIAGNOSIS REQUIRED)    Answer:   + d-dimer, h/o bilateral leg swelling during flight sev day ago now with [facility], palpitations, new cough    Order Specific Question:   Preferred imaging location?    Answer:   External  . Comprehensive metabolic panel     Order Specific Question:   Has the patient fasted?    Answer:   Yes  . Uric acid  . Lipid panel    Order Specific Question:   Has the patient fasted?    Answer:   Yes  . D-dimer, quantitative (not at [facility])  . Care order/instruction:    Scheduling Instructions:     Recheck BP  . EKG 12-Lead    Meds ordered this encounter  Medications  . ALPRAZolam (XANAX) 0.5 MG tablet    Sig: Take 1 tablet (0.5 mg total) by mouth 3 (three) times daily as needed. And take 2 tabs po qhs for sleep    Dispense:  150 tablet    Refill:  2    D/c prior rx sent on 2/8 for alprazolam and for zolpidem.  . furosemide (LASIX) 20 MG tablet    Sig: Take 1 tablet (20 mg total) by mouth daily as needed for edema.    Dispense:  30 tablet  Refill:  0  . ARIPiprazole (ABILIFY) 5 MG tablet    Sig: TAKE 1 TABLET(5 MG) BY MOUTH QHS    Dispense:  90 tablet    Refill:  1    **Patient requests 90 days supply**  . atorvastatin (LIPITOR) 40 MG tablet    Sig: TAKE 1 TABLET BY MOUTH DAILY AT 6 PM    Dispense:  90 tablet    Refill:  3    **Patient requests 90 days supply**  . buPROPion (WELLBUTRIN) 100 MG tablet    Sig: TAKE 1 TABLET(100 MG) BY MOUTH EVERY MORNING    Dispense:  90 tablet    Refill:  1  . FLUoxetine (PROZAC) 40 MG capsule    Sig: Take 1 capsule (40 mg total) by mouth daily.    Dispense:  90 capsule    Refill:  1    I personally performed the services described in this documentation, which was scribed in my presence. The recorded information has been reviewed and considered, and addended by me as needed.   [name], M.D.  Primary Care at [facility] [address] [place], [place] [address] [phone] phone [phone] fax  09/30/17 3:54 PM

## 2017-09-30 NOTE — Patient Instructions (Addendum)
Pick up a pair of mild grade compression socks and put these on first thing in the morning before you travel. Then remove at night before bed. This will prevent fluid from settling in your legs during car or plane trips or when bedridden.  If you d-dimer test comes back positive, then we will need to have you go for a stat ultrasound of your legs tomorrow to ensure there is no lower extremity blood clots. Any blood clot (like a bruise) could make this test positive.  IF you received an x-ray today, you will receive an invoice from [facility]. Please contact [facility] at [phone] with questions or concerns regarding your invoice.   IF you received labwork today, you will receive an invoice from [facility]. Please contact [facility] at [phone] with questions or concerns regarding your invoice.   Our billing staff will not be able to assist you with questions regarding bills from these companies.  You will be contacted with the lab results as soon as they are available. The fastest way to get your results is to activate your My Chart account. Instructions are located on the last page of this paperwork. If you have not heard from [place] regarding the results in 2 weeks, please contact this office.      Peripheral Edema Peripheral edema is swelling that is caused by a buildup of fluid. Peripheral edema most often affects the lower legs, ankles, and feet. It can also develop in the arms, hands, and face. The area of the body that has peripheral edema will look swollen. It may also feel heavy or warm. Your clothes may start to feel tight. Pressing on the area may make a temporary dent in your skin. You may not be able to move your arm or leg as much as usual. There are many causes of peripheral edema. It can be a complication of other diseases, such as congestive heart failure, kidney disease, or a problem with your blood circulation. It also can be a side effect of certain  medicines. It often happens to women during pregnancy. Sometimes, the cause is not known. Treating the underlying condition is often the only treatment for peripheral edema. Follow these instructions at home: Pay attention to any changes in your symptoms. Take these actions to help with your discomfort:  Raise (elevate) your legs while you are sitting or lying down.  Move around often to prevent stiffness and to lessen swelling. Do not sit or stand for long periods of time.  Wear support stockings as told by your health care provider.  Follow instructions from your health care provider about limiting salt (sodium) in your diet. Sometimes eating less salt can reduce swelling.  Take over-the-counter and prescription medicines only as told by your health care provider. Your health care provider may prescribe medicine to help your body get rid of excess water (diuretic).  Keep all follow-up visits as told by your health care provider. This is important.  Contact a health care provider if:  You have a fever.  Your edema starts suddenly or is getting worse, especially if you are pregnant or have a medical condition.  You have swelling in only one leg.  You have increased swelling and pain in your legs. Get help right away if:  You develop shortness of breath, especially when you are lying down.  You have pain in your chest or abdomen.  You feel weak.  You faint. This information is not intended to replace advice given to  you by your health care provider. Make sure you discuss any questions you have with your health care provider. Document Released: 09/06/2004 Document Revised: 01/02/2016 Document Reviewed: 02/09/2015 Elsevier Interactive Patient Education  2018 [employer].  Deep Vein Thrombosis Deep vein thrombosis (DVT) is a condition in which a blood clot forms in a deep vein, such as a lower leg, thigh, or arm vein. A clot is blood that has thickened into a gel or solid. This  condition is dangerous. It can lead to serious and even life-threatening complications if the clot travels to the lungs and causes a blockage (pulmonary embolism). It can also damage veins in the leg. This can result in leg pain, swelling, discoloration, and sores (post-thrombotic syndrome). What are the causes? This condition may be caused by:  A slowdown of blood flow.  Damage to a vein.  A condition that makes blood clot more easily.  What increases the risk? The following factors may make you more likely to develop this condition:  Being overweight.  Being elderly, especially over age 40.  Sitting or lying down for more than four hours.  Lack of physical activity (sedentary lifestyle).  Being pregnant, giving birth, or having recently given birth.  Taking medicines that contain estrogen.  Smoking.  A history of any of the following: ? Blood clots or blood clotting disease. ? Peripheral vascular disease. ? Inflammatory bowel disease. ? Cancer. ? Heart disease. ? Genetic conditions that affect how blood clots. ? Neurological diseases that affect the legs (leg paresis). ? Injury. ? Major or lengthy surgery. ? A central line placed inside a large vein.  What are the signs or symptoms? Symptoms of this condition include:  Swelling, pain, or tenderness in an arm or leg.  Warmth, redness, or discoloration in an arm or leg.  If the clot is in your leg, symptoms may be more noticeable or worse when you stand or walk. Some people do not have any symptoms. How is this diagnosed? This condition is diagnosed with:  A medical history.  A physical exam.  Tests, such as: ? Blood tests. These are done to see how your blood clots. ? Imaging tests. These are done to check for clots. Tests may include:  Ultrasound.  CT scan.  MRI.  X-ray.  Venogram. For this test, X-rays are taken after a dye is injected into a vein.  How is this treated? Treatment for this  condition depends on the cause, your risk for bleeding or developing more clots, and any medical conditions you have. Treatment may include:  Taking blood thinners (also called anticoagulants). These medicines may be taken by mouth, injected under the skin, or injected through an IV tube (catheter). These medicines prevent clots from forming.  Injecting medicine that dissolves blood clots into the affected vein (catheter-directed thrombolysis).  Having surgery. Surgery may be done to: ? Remove the clot. ? Place a filter in a large vein to catch blood clots before they reach the lungs.  Some treatments may be continued for up to six months. Follow these instructions at home: If you are taking an oral blood thinner:  Take the medicine exactly as told by your health care provider. Some blood thinners need to be taken at the same time every day. Do not skip a dose.  Ask your health care provider about what foods and drugs interact with the medicine.  Ask about possible side effects. General instructions  Blood thinners can cause easy bruising and difficulty stopping bleeding.  Because of this, if you are taking or were given a blood thinner: ? Hold pressure over cuts for longer than usual. ? Tell your dentist and other health care providers that you are taking blood thinners before having any procedures that can cause bleeding. ? Avoid contact sports.  Take over-the-counter and prescription medicines only as told by your health care provider.  Return to your normal activities as told by your health care provider. Ask your health care provider what activities are safe for you.  Wear compression stockings if recommended by your health care provider.  Keep all follow-up visits as told by your health care provider. This is important. How is this prevented? To lower your risk of developing this condition again:  For 30 or more minutes every day, do an activity that: ? Involves moving your  arms and legs. ? Increases your heart rate.  When traveling for longer than four hours: ? Exercise your arms and legs every hour. ? Drink plenty of water. ? Avoid drinking alcohol.  Avoid sitting or lying for a long time without moving your legs.  Stay a healthy weight.  If you are a woman who is older than age 13, avoid unnecessary use of medicines that contain estrogen.  Do not use any products that contain nicotine or tobacco, such as cigarettes and e-cigarettes. This is especially important if you take estrogen medicines. If you need help quitting, ask your health care provider.  Contact a health care provider if:  You miss a dose of your blood thinner.  You have nausea, vomiting, or diarrhea that lasts for more than one day.  Your menstrual period is heavier than usual.  You have unusual bruising. Get help right away if:  You have new or increased pain, swelling, or redness in an arm or leg.  You have numbness or tingling in an arm or leg.  You have shortness of breath.  You have chest pain.  You have a rapid or irregular heartbeat.  You feel light-headed or dizzy.  You cough up blood.  There is blood in your vomit, stool, or urine.  You have a serious fall or accident, or you hit your head.  You have a severe headache or confusion.  You have a cut that will not stop bleeding. These symptoms may represent a serious problem that is an emergency. Do not wait to see if the symptoms will go away. Get medical help right away. Call your local emergency services (911 in the [place] Do not drive yourself to the hospital. Summary  DVT is a condition in which a blood clot forms in a deep vein, such as a lower leg, thigh, or arm vein.  Symptoms can include swelling, warmth, pain, and redness in your leg or arm.  Treatment may include taking blood thinners, injecting medicine that dissolves blood clots,wearing compression stockings, or surgery.  If you are prescribed  blood thinners, take them exactly as told. This information is not intended to replace advice given to you by your health care provider. Make sure you discuss any questions you have with your health care provider. Document Released: 07/30/2005 Document Revised: 09/01/2016 Document Reviewed: 09/01/2016 Elsevier Interactive Patient Education  2018 [employer].

## 2017-09-30 NOTE — Telephone Encounter (Signed)
Critical D-Dimer result 0.60 Phoned in by Lia Hopping at [place], [place] [phone]

## 2017-09-30 NOTE — Addendum Note (Signed)
Addended by: Shawnee Knapp on: 09/30/2017 04:08 PM   Modules accepted: Orders

## 2017-10-01 ENCOUNTER — Ambulatory Visit (HOSPITAL_COMMUNITY)
Admission: RE | Admit: 2017-10-01 | Discharge: 2017-10-01 | Disposition: A | Discharge status: Home / Self Care | Payer: Medicare Other | Source: Ambulatory Visit | Attending: Family Medicine | Admitting: Family Medicine

## 2017-10-01 DIAGNOSIS — R609 Edema, unspecified: Secondary | ICD-10-CM | POA: Insufficient documentation

## 2017-10-01 DIAGNOSIS — R059 Cough, unspecified: Secondary | ICD-10-CM

## 2017-10-01 DIAGNOSIS — R0602 Shortness of breath: Secondary | ICD-10-CM | POA: Insufficient documentation

## 2017-10-01 DIAGNOSIS — R002 Palpitations: Secondary | ICD-10-CM

## 2017-10-01 DIAGNOSIS — R05 Cough: Secondary | ICD-10-CM

## 2017-10-01 LAB — COMPREHENSIVE METABOLIC PANEL
ALBUMIN: 4 g/dL (ref 3.6–4.8)
ALK PHOS: 116 IU/L (ref 39–117)
ALT: 58 IU/L — ABNORMAL HIGH (ref 0–32)
AST: 39 IU/L (ref 0–40)
Albumin/Globulin Ratio: 1.9 (ref 1.2–2.2)
BILIRUBIN TOTAL: 0.3 mg/dL (ref 0.0–1.2)
BUN / CREAT RATIO: 19 (ref 12–28)
BUN: 17 mg/dL (ref 8–27)
CO2: 25 mmol/L (ref 20–29)
CREATININE: 0.91 mg/dL (ref 0.57–1.00)
Calcium: 9.1 mg/dL (ref 8.7–10.3)
Chloride: 100 mmol/L (ref 96–106)
GFR calc non Af Amer: 65 mL/min/{1.73_m2} (ref >59)
GFR, EST AFRICAN AMERICAN: 76 mL/min/{1.73_m2} (ref >59)
GLUCOSE: 103 mg/dL — AB (ref 65–99)
Globulin, Total: 2.1 g/dL (ref 1.5–4.5)
Potassium: 4 mmol/L (ref 3.5–5.2)
SODIUM: 141 mmol/L (ref 134–144)
TOTAL PROTEIN: 6.1 g/dL (ref 6.0–8.5)

## 2017-10-01 LAB — LIPID PANEL
CHOL/HDL RATIO: 3.5 ratio (ref 0.0–4.4)
Cholesterol, Total: 173 mg/dL (ref 100–199)
HDL: 49 mg/dL (ref >39)
LDL Calculated: 74 mg/dL (ref 0–99)
TRIGLYCERIDES: 249 mg/dL — AB (ref 0–149)
VLDL Cholesterol Cal: 50 mg/dL — ABNORMAL HIGH (ref 5–40)

## 2017-10-01 LAB — URIC ACID: URIC ACID: 4.3 mg/dL (ref 2.5–7.1)

## 2017-10-01 NOTE — Progress Notes (Signed)
*  Preliminary Results* Bilateral lower extremity venous duplex completed. Bilateral lower extremities are negative for deep vein thrombosis. There is no evidence of Baker's cyst bilaterally.  10/01/2017 11:47 AM Maudry Mayhew, BS, RVT, RDCS, RDMS

## 2017-10-02 MED ORDER — BUPROPION HCL 100 MG PO TABS: ORAL_TABLET | ORAL | 1 refills | Status: DC

## 2017-10-02 MED ORDER — ARIPIPRAZOLE 5 MG PO TABS: ORAL_TABLET | ORAL | 1 refills | Status: DC

## 2017-10-02 MED ORDER — FLUOXETINE HCL 40 MG PO CAPS: 40.0000 mg | ORAL_CAPSULE | Freq: Every day | ORAL | 1 refills | Status: DC

## 2017-10-02 MED ORDER — ATORVASTATIN CALCIUM 40 MG PO TABS: ORAL_TABLET | ORAL | 3 refills | Status: DC

## 2017-10-02 NOTE — Addendum Note (Signed)
Addended by: Shawnee Knapp on: 10/02/2017 02:29 AM   Modules accepted: Orders

## 2017-10-03 NOTE — Telephone Encounter (Signed)
Pt seen at vascular lab following critical lab. Dr. Brigitte Pulse reviewed reports

## 2017-10-09 ENCOUNTER — Encounter: Payer: Self-pay | Admitting: Radiology

## 2017-10-18 ENCOUNTER — Other Ambulatory Visit: Payer: Self-pay | Admitting: Family Medicine

## 2017-10-18 NOTE — Telephone Encounter (Signed)
LOV: 09/30/17  Dr. Brigitte Pulse  [facility] [address]

## 2017-10-29 ENCOUNTER — Other Ambulatory Visit: Payer: Self-pay | Admitting: Family Medicine

## 2017-11-16 ENCOUNTER — Other Ambulatory Visit: Payer: Self-pay | Admitting: Family Medicine

## 2017-12-23 ENCOUNTER — Encounter: Payer: Self-pay | Admitting: Family Medicine

## 2017-12-23 ENCOUNTER — Ambulatory Visit (INDEPENDENT_AMBULATORY_CARE_PROVIDER_SITE_OTHER): Payer: Medicare Other | Admitting: Family Medicine

## 2017-12-23 ENCOUNTER — Other Ambulatory Visit: Payer: Self-pay

## 2017-12-23 VITALS — BP 138/98 | HR 95 | Temp 98.3°F | Resp 18 | Ht 64.0 in | Wt 200.2 lb

## 2017-12-23 DIAGNOSIS — J0101 Acute recurrent maxillary sinusitis: Secondary | ICD-10-CM

## 2017-12-23 DIAGNOSIS — R0602 Shortness of breath: Secondary | ICD-10-CM

## 2017-12-23 DIAGNOSIS — M109 Gout, unspecified: Secondary | ICD-10-CM | POA: Diagnosis not present

## 2017-12-23 DIAGNOSIS — M7021 Olecranon bursitis, right elbow: Secondary | ICD-10-CM | POA: Diagnosis not present

## 2017-12-23 DIAGNOSIS — IMO0001 Reserved for inherently not codable concepts without codable children: Secondary | ICD-10-CM

## 2017-12-23 DIAGNOSIS — F419 Anxiety disorder, unspecified: Secondary | ICD-10-CM | POA: Diagnosis not present

## 2017-12-23 DIAGNOSIS — I10 Essential (primary) hypertension: Secondary | ICD-10-CM

## 2017-12-23 DIAGNOSIS — R911 Solitary pulmonary nodule: Secondary | ICD-10-CM | POA: Diagnosis not present

## 2017-12-23 DIAGNOSIS — G4700 Insomnia, unspecified: Secondary | ICD-10-CM | POA: Diagnosis not present

## 2017-12-23 MED ORDER — IPRATROPIUM BROMIDE 0.03 % NA SOLN: 2.0000 | Freq: Four times a day (QID) | NASAL | 1 refills | Status: DC

## 2017-12-23 MED ORDER — DICLOFENAC SODIUM 75 MG PO TBEC: 75.0000 mg | DELAYED_RELEASE_TABLET | Freq: Two times a day (BID) | ORAL | 2 refills | Status: DC | PRN

## 2017-12-23 MED ORDER — MOMETASONE FUROATE 50 MCG/ACT NA SUSP: 2.0000 | Freq: Every day | NASAL | 1 refills | Status: DC

## 2017-12-23 MED ORDER — ALBUTEROL SULFATE HFA 108 (90 BASE) MCG/ACT IN AERS: 2.0000 | INHALATION_SPRAY | RESPIRATORY_TRACT | 1 refills | Status: DC | PRN

## 2017-12-23 MED ORDER — AMOXICILLIN-POT CLAVULANATE 875-125 MG PO TABS: 1.0000 | ORAL_TABLET | Freq: Two times a day (BID) | ORAL | 0 refills | Status: DC

## 2017-12-23 MED ORDER — CYCLOBENZAPRINE HCL 10 MG PO TABS: 10.0000 mg | ORAL_TABLET | Freq: Three times a day (TID) | ORAL | 0 refills | Status: DC | PRN

## 2017-12-23 MED ORDER — SUMATRIPTAN SUCCINATE 100 MG PO TABS: ORAL_TABLET | ORAL | 5 refills | Status: DC

## 2017-12-23 MED ORDER — ALLOPURINOL 300 MG PO TABS: 300.0000 mg | ORAL_TABLET | Freq: Every day | ORAL | 3 refills | Status: DC

## 2017-12-23 MED ORDER — VALSARTAN-HYDROCHLOROTHIAZIDE 160-25 MG PO TABS: 1.0000 | ORAL_TABLET | Freq: Every day | ORAL | 0 refills | Status: DC

## 2017-12-23 NOTE — Progress Notes (Addendum)
Subjective:  By signing my name below, I, Melinda Peterson, attest that this documentation has been prepared under the direction and in the presence of [name], MD Electronically Signed: Ladene Artist, ED Scribe 12/23/2017 at 12:01 PM.   Patient ID: Melinda Peterson, female    DOB: 24-Dec-1950, 67 y.o.   MRN: [id number]  Chief Complaint  Patient presents with  . Hypertension    Pt states she doesn't check her blood pressure at home  . Follow-up  . Medication Refill    Allopurinol 300 MG, Valsartan- HCT 160-25 MG   HPI Melinda Peterson is a 67 y.o. female who presents to Primary Care at [facility] for f/u on HTN and a medication refill. At least OV, BP was too elevated. She admittedly had a poor diet and swelling in legs due to a recent flight. Pt was sent for an [place] due to positive d-dimer but neg DVT. Advised compression socks and prn lasix 20. - Pt thinks she went ~1 wk without her Diovan and has only taking her meds for ~2 days due to URI symptoms.  BP Readings from Last 3 Encounters:  12/23/17 (!) 140/100  09/30/17 (!) 141/82  06/11/17 118/80   URI Pt presents with fatigue, nasal congestion, productive cough with thick yellow phlegm with intermittent sob, chills, fever with Tmax of 102 F several days ago that has resolved. Pt reports sob randomly occurs for several mins while resting in a recliner but resolves while taking deep breaths. She has been using previously prescribed tessalon perles, Mucinex, Alka Seltzer Flu, DayQuil that she hasn't used in over 1 wk and honey. Denies chest palpitations. She has been getting ~7 hrs of sleep/night. She has not used her inhaler recently. Pt saw cardiologist Dr. Marlou Porch 05/30/15; found to have sinus tachycardia.  R Elbow Pt reports gradually improving swelling to the R elbow.  Past Medical History:  Diagnosis Date  . Allergy   . Anxiety   . Arthritis   . Cataract   . Chronic headaches   . Colon polyps   . Depression   . Diverticulosis   . Eczema     . GERD (gastroesophageal reflux disease)   . HTN (hypertension)   . Hyperlipidemia   . Irritable bowel syndrome   . Neuromuscular disorder ([place])   . Osteopenia   . Osteoporosis   . Tobacco use 04/17/2013   Current Outpatient Medications on File Prior to Visit  Medication Sig Dispense Refill  . albuterol (PROVENTIL HFA;VENTOLIN HFA) 108 (90 Base) MCG/ACT inhaler Inhale 2 puffs into the lungs every 4 (four) hours as needed for wheezing or shortness of breath (cough, shortness of breath or wheezing.). 1 Inhaler 1  . allopurinol (ZYLOPRIM) 300 MG tablet Take 1 tablet (300 mg total) by mouth daily. Office visit needed 90 tablet 0  . ALPRAZolam (XANAX) 0.5 MG tablet Take 1 tablet (0.5 mg total) by mouth 3 (three) times daily as needed. And take 2 tabs po qhs for sleep 150 tablet 2  . ARIPiprazole (ABILIFY) 5 MG tablet TAKE 1 TABLET(5 MG) BY MOUTH QHS 90 tablet 1  . aspirin 81 MG tablet Take 81 mg by mouth daily.    Marland Kitchen atorvastatin (LIPITOR) 40 MG tablet TAKE 1 TABLET BY MOUTH DAILY AT 6 PM 90 tablet 3  . atorvastatin (LIPITOR) 40 MG tablet TAKE 1 TABLET BY MOUTH DAILY, AT 6:00 PM 90 tablet 0  . buPROPion (WELLBUTRIN) 100 MG tablet TAKE 1 TABLET(100 MG) BY MOUTH EVERY MORNING 90 tablet  1  . cetirizine (ZYRTEC) 10 MG tablet Take 1 tablet (10 mg total) by mouth at bedtime. 30 tablet 11  . cyclobenzaprine (FLEXERIL) 10 MG tablet TAKE 1 TABLET(10 MG) BY MOUTH THREE TIMES DAILY AS NEEDED FOR MUSCLE SPASMS 180 tablet 0  . diclofenac (VOLTAREN) 75 MG EC tablet Take 1 tablet (75 mg total) by mouth 2 (two) times daily as needed for moderate pain. 60 tablet 2  . esomeprazole (NEXIUM) 20 MG capsule TAKE ONE CAPSULE BY MOUTH TWICE DAILY BEFORE MEALS AS NEEDED FOR ACID REFLUX AND INDIGESTION 180 capsule 1  . FLUoxetine (PROZAC) 40 MG capsule Take 1 capsule (40 mg total) by mouth daily. 90 capsule 1  . furosemide (LASIX) 20 MG tablet TAKE 1 TABLET(20 MG) BY MOUTH DAILY AS NEEDED FOR SWELLING 90 tablet 0  .  ipratropium (ATROVENT) 0.03 % nasal spray Place 2 sprays into the nose 4 (four) times daily. 30 mL 1  . Melatonin 10 MG CAPS Take 10 mg by mouth at bedtime.    . mometasone (NASONEX) 50 MCG/ACT nasal spray Place 2 sprays into the nose at bedtime. 17 g 1  . Multiple Vitamin (MULTIVITAMIN) capsule Take 1 capsule by mouth daily.    Marland Kitchen OVER THE COUNTER MEDICATION OTC Vitamin D 3 2000 mg daily    . ranitidine (ZANTAC) 150 MG tablet Take 1 tablet (150 mg total) by mouth 2 (two) times daily. Prn heartburn 60 tablet 0  . SUMAtriptan (IMITREX) 100 MG tablet TAKE 1 TABLET BY MOUTH EVERY 2 HOURS AS NEEDED FOR MIGRAINE 10 tablet 2  . traZODone (DESYREL) 100 MG tablet TAKE 1 TABLET(100 MG) BY MOUTH AT BEDTIME AS NEEDED FOR SLEEP 90 tablet 3  . triamcinolone cream (KENALOG) 0.1 % Apply 1 application topically 2 (two) times daily. 30 g 0  . Turmeric 500 MG TABS Take by mouth.    . valsartan-hydrochlorothiazide (DIOVAN-HCT) 160-25 MG tablet TAKE 1 TABLET BY MOUTH DAILY 90 tablet 0   No current facility-administered medications on file prior to visit.    Allergies  Allergen Reactions  . Biaxin [Clarithromycin]   . Clarithromycin Hives  . Lincomycin Hcl Hives  . Lisinopril Swelling   Review of Systems  Constitutional: Positive for chills, fatigue and fever (resolved).  HENT: Positive for congestion.   Respiratory: Positive for shortness of breath (intermittent).   Cardiovascular: Negative for palpitations.  Musculoskeletal: Positive for arthralgias.      Objective:   Physical Exam  Constitutional: She is oriented to person, place, and time. She appears well-developed and well-nourished. No distress.  HENT:  Head: Normocephalic and atraumatic.  Right Ear: A middle ear effusion is present.  Left Ear: Tympanic membrane is injected. A middle ear effusion is present.  Nose: Nose normal.  Mouth/Throat: Mucous membranes are dry.  Eyes: Conjunctivae and EOM are normal.  Neck: Neck supple. No tracheal  deviation present.  Cardiovascular: Normal rate, regular rhythm, S1 normal and normal heart sounds.  Pulmonary/Chest: Effort normal and breath sounds normal. No respiratory distress.  Musculoskeletal: Normal range of motion.  Neurological: She is alert and oriented to person, place, and time.  Skin: Skin is warm and dry.  Psychiatric: She has a normal mood and affect. Her behavior is normal.  Nursing note and vitals reviewed.  Vitals:   12/23/17 1120 12/23/17 1223  BP: (!) 140/100 (!) 138/98  Pulse: 95   Resp: 18   Temp: 98.3 F (36.8 C)   TempSrc: Oral   SpO2: 95%   Weight:  200 lb 3.2 oz (90.8 kg)   Height: 5\' 4"  (1.626 m)       Assessment & Plan:   1. Acute recurrent maxillary sinusitis - start Augmentin' no abx in > 1 yr - start Augmentin due to weeks of protracted illness with secondary worsening after initial improvement 1 wk after sxs started.  Refilled prn tessalon which work well for her  2. Essential hypertension - Continue diovan hct 160-25 - plenty of room to increase the valsartan component of this med to 320mg -25 but will hold off any change in dose for now since pt has been off of med x 1 wk due to acute illness and just restarted in ~2d ago. Last OV BP was to high but was also off med at that time for a little while. Does not check outside office. Recheck in [place] prior to move in next 1-2 mos.   3. Shortness of breath -episodic lasting for just a few minutes usu occurring towards evening when resting (but awake). Nml chest CT last yr.  Refer to cardiology to consider need for stress test as possible (highly unlikely) that this is her anginal equivalent. Did see Dr. Marlou Porch ~ 2 yrs ago w/ echo and Holter. Pt may now benefit from stress testing, likely need chemical.  4. Olecranon bursitis of right elbow - info in handout given, ace bandaged wrapped snugly - ok to remove in evening but otherwise remain on as much as possible and avoid direct pressure. If it because larger, more  painful, erythema, anything draining, etc, will RTC for repeat eval and consider aspiration.  5. Insomnia, unspecified type   6. Anxiety   7. Lung nodule < 6cm on CT - stable x > 2 yrs - no further f/u needed  8. Gout of right foot, unspecified cause, unspecified chronicity - uric acid nml 3 mos prior at 4.3 on allopurinol 300 so continue   Peripheral edema dev during traveling responded well to prn lasix 20.  Has one more xanax refill on file w/ pharm - last was filled 3/9 and 4/19.  Ok to refill xanax x 3 mos on or after 6/19.  Pt and husband might move to [place] soon to be near grandkids - if so, she will try to get in to see me one more time before she takes of for recheck. Advised pt I will give her 1 yr of her chronic meds and 6 mo of controlled (xanax) at that time to give her plenty of time to find a new provider.  Orders Placed This Encounter  Procedures  . Ambulatory referral to Cardiology    Referral Priority:   Routine    Referral Type:   Consultation    Referral Reason:   Specialty Services Required    Requested Specialty:   Cardiology    Number of Visits Requested:   1    Meds ordered this encounter  Medications  . valsartan-hydrochlorothiazide (DIOVAN-HCT) 160-25 MG tablet    Sig: Take 1 tablet by mouth daily.    Dispense:  90 tablet    Refill:  0    Office visit needed  . amoxicillin-clavulanate (AUGMENTIN) 875-125 MG tablet    Sig: Take 1 tablet by mouth 2 (two) times daily.    Dispense:  20 tablet    Refill:  0  . allopurinol (ZYLOPRIM) 300 MG tablet    Sig: Take 1 tablet (300 mg total) by mouth daily.    Dispense:  90 tablet  Refill:  3  . cyclobenzaprine (FLEXERIL) 10 MG tablet    Sig: Take 1 tablet (10 mg total) by mouth 3 (three) times daily as needed for muscle spasms.    Dispense:  180 tablet    Refill:  0  . ipratropium (ATROVENT) 0.03 % nasal spray    Sig: Place 2 sprays into the nose 4 (four) times daily.    Dispense:  30 mL    Refill:  1  .  mometasone (NASONEX) 50 MCG/ACT nasal spray    Sig: Place 2 sprays into the nose at bedtime.    Dispense:  17 g    Refill:  1  . diclofenac (VOLTAREN) 75 MG EC tablet    Sig: Take 1 tablet (75 mg total) by mouth 2 (two) times daily as needed for moderate pain.    Dispense:  60 tablet    Refill:  2  . SUMAtriptan (IMITREX) 100 MG tablet    Sig: TAKE 1 TABLET BY MOUTH EVERY 2 HOURS AS NEEDED FOR MIGRAINE    Dispense:  10 tablet    Refill:  5  . albuterol (PROVENTIL HFA;VENTOLIN HFA) 108 (90 Base) MCG/ACT inhaler    Sig: Inhale 2 puffs into the lungs every 4 (four) hours as needed for wheezing or shortness of breath (cough, shortness of breath or wheezing.).    Dispense:  1 Inhaler    Refill:  1    I personally performed the services described in this documentation, which was scribed in my presence. The recorded information has been reviewed and considered, and addended by me as needed.   [name], M.D.  Primary Care at [facility] [address] [place], [place] [address] [phone] phone [phone] fax  12/23/17 12:31 PM

## 2017-12-23 NOTE — Patient Instructions (Addendum)
We recommend that you schedule a mammogram for breast cancer screening. Typically, you do not need a referral to do this. Please contact a local imaging center to schedule your mammogram.  [facility] - [phone]  *ask for the Radiology Department [facility] - [phone] or [phone]  [facility] - [phone] [place] [phone] [facility] - [phone]  *ask for the [facility] - [phone]  *ask for the Radiology Department [facility] - [phone]  *ask for the [place] - [phone]   IF you received an x-ray today, you will receive an invoice from [facility]. Please contact [facility] at [phone] with questions or concerns regarding your invoice.   IF you received labwork today, you will receive an invoice from [facility]. Please contact [facility] at [phone] with questions or concerns regarding your invoice.   Our billing staff will not be able to assist you with questions regarding bills from these companies.  You will be contacted with the lab results as soon as they are available. The fastest way to get your results is to activate your My Chart account. Instructions are located on the last page of this paperwork. If you have not heard from [place] regarding the results in 2 weeks, please contact this office.       Elbow Bursitis Elbow bursitis is inflammation of the fluid-filled sac (bursa) between the tip of your elbow bone (olecranon) and your skin. Elbow bursitis may also be called olecranon bursitis. Normally, the olecranon bursa has only a small amount of fluid in it to cushion and protect your elbow bone. Elbow bursitis causes fluid to build up inside the bursa. Over time, this swelling and inflammation can cause pain when you bend or lean on your elbow. What are the  causes? Elbow bursitis may be caused by:  Elbow injury (acute trauma).  Leaning on hard surfaces for long periods of time.  Infection from an injury that breaks the skin near your elbow.  A bone growth (spur) that forms at the tip of your elbow.  A medical condition that causes inflammation in your body, such as gout or rheumatoid arthritis.  The cause may also be unknown. What are the signs or symptoms? The first sign of elbow bursitis is usually swelling over the tip of your elbow. This can grow to be the size of a golf ball. This may start suddenly or develop gradually. You may also have:  Pain when bending or leaning on your elbow.  Restricted movement of your elbow.  If your bursitis is caused by an infection, symptoms may also include:  Redness, warmth, and tenderness of the elbow.  Drainage of pus from the swollen area over your elbow, if the skin breaks open.  How is this diagnosed? Your health care provider may be able to diagnose elbow bursitis based on your signs and symptoms, especially if you have recently been injured. Your health care provider will also do a physical exam. This may include:  X-rays to look for a bone spur or a bone fracture.  Draining fluid from the bursa to test it for infection.  Blood tests to rule out gout or rheumatoid arthritis.  How is this treated? Treatment for elbow bursitis depends on the cause. Treatment may include:  Medicines. These may include: ? Over-the-counter medicines to relieve pain and inflammation. ? Antibiotic medicines to fight infection. ?  Injections of anti-inflammatory medicines (steroids).  Wrapping your elbow with a bandage.  Draining fluid from the bursa.  Wearing elbow pads.  If your bursitis does not get better with treatment, surgery may be needed to remove the bursa. Follow these instructions at home:  Take medicines only as directed by your health care provider.  If you were prescribed an  antibiotic medicine, finish all of it even if you start to feel better.  If your bursitis is caused by an injury, rest your elbow and wear your bandage as directed by your health care provider. You may alsoapply ice to the injured area as directed by your health care provider: ? Put ice in a plastic bag. ? Place a towel between your skin and the bag. ? Leave the ice on for 20 minutes, 2-3 times per day.  Avoid any activities that cause elbow pain.  Use elbow pads or elbow wraps to cushion your elbow. Contact a health care provider if:  You have a fever.  Your symptoms do not get better with treatment.  Your pain or swelling gets worse.  Your elbow pain or swelling goes away and then returns.  You have drainage of pus from the swollen area over your elbow. This information is not intended to replace advice given to you by your health care provider. Make sure you discuss any questions you have with your health care provider. Document Released: 08/29/2006 Document Revised: 01/05/2016 Document Reviewed: 04/07/2014 Elsevier Interactive Patient Education  2018 [place].  Elbow Bursitis Rehab Ask your health care provider which exercises are safe for you. Do exercises exactly as told by your health care provider and adjust them as directed. It is normal to feel mild stretching, pulling, tightness, or discomfort as you do these exercises, but you should stop right away if you feel sudden pain or your pain gets worse. Do not begin these exercises until told by your health care provider. Stretching and range of motion exercises These exercises warm up your muscles and joints and improve the movement and flexibility of your elbow. These exercises also help to relieve pain and swelling. Exercise A: Passive elbow flexion  1. Lie on your back. 2. Extend your left / right arm up into the air, bracing it with your other hand. Allow your left / right arm to relax. 3. Let your left / right elbow  bend, allowing your hand to fall slowly toward your chest. You should feel a gentle stretch along the back of your upper arm and elbow. 4. If told by your health care provider, hold a __________ hand weight to increase the intensity of this stretch. 5. Hold this position for __________ seconds. 6. Slowly return your left / right arm to the upright position. Repeat __________ times. Complete this exercise __________ times a day. Exercise B: Passive elbow extension  1. Lie on your back on a firm bed. Make sure that you are in a comfortable position that allows you relax your arm muscles. 2. Place a folded towel under your left / right upper arm so that your elbow and shoulder are at the same height. 3. Extend your left / right arm so your elbow and hand do not rest on the bed or towel. 4. Let the weight of your hand straighten your elbow. Keep your arm and chest muscles relaxed. You should feel a stretch on the inside of your elbow. 5. If told by your health care provider, increase the intensity of your stretch by  adding a small wrist weight or hand weight. 6. Hold this position for __________ seconds. 7. Slowly return to the starting position. Repeat __________ times. Complete this exercise __________ times a day. Strengthening exercises These exercises build strength and endurance in your elbow. Endurance is the ability to use your muscles for a long time, even after they get tired. Exercise C: Elbow extensors, isometric  1. Stand or sit upright on a firm surface. 2. Place your left / right arm so your palm faces your abdomen, at the height of your waist. 3. Place your other hand on the underside of your forearm. Slowly push your left / right arm down, like you were going to straighten your elbow. Resist with your other hand. Push as hard as you can without causing any pain or movement at your left / right elbow. 4. Hold this position for __________ seconds. 5. Slowly release the tension in  both arms. 6. Let your muscles relax completely before repeating. Repeat __________ times. Complete this exercise __________ times a day. Exercise D: Elbow flexors, isometric 1. Stand or sit upright on a firm surface. 2. Place your left / right arm so your hand is palm-up, at the height of your waist. 3. Place your other hand on top of your forearm. Gently push up with your left / right arm, like you were going to bend your elbow. Resist this motion with your other hand. Push as hard as you can without causing any pain or movement at your left / right elbow. 4. Hold this position for __________ seconds. 5. Slowly release the tension in both arms. 6. Let your muscles relax completely before repeating. Repeat __________ times. Complete this exercise __________ times a day. This information is not intended to replace advice given to you by your health care provider. Make sure you discuss any questions you have with your health care provider. Document Released: 07/30/2005 Document Revised: 04/03/2016 Document Reviewed: 04/28/2015 Elsevier Interactive Patient Education  [employer].

## 2018-01-21 ENCOUNTER — Ambulatory Visit: Payer: Medicare Other | Admitting: Family Medicine

## 2018-02-18 ENCOUNTER — Other Ambulatory Visit: Payer: Self-pay | Admitting: Family Medicine

## 2018-03-12 ENCOUNTER — Other Ambulatory Visit: Payer: Self-pay | Admitting: Family Medicine

## 2018-03-12 DIAGNOSIS — Z1231 Encounter for screening mammogram for malignant neoplasm of breast: Secondary | ICD-10-CM

## 2018-03-18 ENCOUNTER — Other Ambulatory Visit: Payer: Self-pay | Admitting: Family Medicine

## 2018-03-27 ENCOUNTER — Other Ambulatory Visit: Payer: Self-pay

## 2018-03-27 ENCOUNTER — Encounter: Payer: Self-pay | Admitting: Family Medicine

## 2018-03-27 ENCOUNTER — Ambulatory Visit (INDEPENDENT_AMBULATORY_CARE_PROVIDER_SITE_OTHER): Payer: Medicare Other | Admitting: Family Medicine

## 2018-03-27 VITALS — BP 118/76 | HR 116 | Temp 98.0°F | Resp 16 | Ht 62.99 in | Wt 205.0 lb

## 2018-03-27 DIAGNOSIS — E785 Hyperlipidemia, unspecified: Secondary | ICD-10-CM

## 2018-03-27 DIAGNOSIS — E8881 Metabolic syndrome: Secondary | ICD-10-CM

## 2018-03-27 DIAGNOSIS — R739 Hyperglycemia, unspecified: Secondary | ICD-10-CM

## 2018-03-27 DIAGNOSIS — M8000XK Age-related osteoporosis with current pathological fracture, unspecified site, subsequent encounter for fracture with nonunion: Secondary | ICD-10-CM | POA: Diagnosis not present

## 2018-03-27 DIAGNOSIS — G4700 Insomnia, unspecified: Secondary | ICD-10-CM | POA: Diagnosis not present

## 2018-03-27 DIAGNOSIS — F419 Anxiety disorder, unspecified: Secondary | ICD-10-CM

## 2018-03-27 DIAGNOSIS — M109 Gout, unspecified: Secondary | ICD-10-CM | POA: Diagnosis not present

## 2018-03-27 DIAGNOSIS — R7303 Prediabetes: Secondary | ICD-10-CM

## 2018-03-27 DIAGNOSIS — F3341 Major depressive disorder, recurrent, in partial remission: Secondary | ICD-10-CM | POA: Diagnosis not present

## 2018-03-27 DIAGNOSIS — I10 Essential (primary) hypertension: Secondary | ICD-10-CM | POA: Diagnosis not present

## 2018-03-27 LAB — COMPREHENSIVE METABOLIC PANEL
ALT: 33 IU/L — AB (ref 0–32)
AST: 23 IU/L (ref 0–40)
Albumin/Globulin Ratio: 1.7 (ref 1.2–2.2)
Albumin: 4.2 g/dL (ref 3.6–4.8)
Alkaline Phosphatase: 122 IU/L — ABNORMAL HIGH (ref 39–117)
BILIRUBIN TOTAL: 0.3 mg/dL (ref 0.0–1.2)
BUN/Creatinine Ratio: 18 (ref 12–28)
BUN: 22 mg/dL (ref 8–27)
CHLORIDE: 100 mmol/L (ref 96–106)
CO2: 22 mmol/L (ref 20–29)
Calcium: 9.2 mg/dL (ref 8.7–10.3)
Creatinine, Ser: 1.19 mg/dL — ABNORMAL HIGH (ref 0.57–1.00)
GFR calc non Af Amer: 47 mL/min/{1.73_m2} — ABNORMAL LOW (ref >59)
GFR, EST AFRICAN AMERICAN: 54 mL/min/{1.73_m2} — AB (ref >59)
GLUCOSE: 102 mg/dL — AB (ref 65–99)
Globulin, Total: 2.5 g/dL (ref 1.5–4.5)
Potassium: 3.8 mmol/L (ref 3.5–5.2)
Sodium: 138 mmol/L (ref 134–144)
TOTAL PROTEIN: 6.7 g/dL (ref 6.0–8.5)

## 2018-03-27 LAB — LIPID PANEL
Chol/HDL Ratio: 3.6 ratio (ref 0.0–4.4)
Cholesterol, Total: 175 mg/dL (ref 100–199)
HDL: 49 mg/dL (ref >39)
LDL Calculated: 68 mg/dL (ref 0–99)
TRIGLYCERIDES: 292 mg/dL — AB (ref 0–149)
VLDL Cholesterol Cal: 58 mg/dL — ABNORMAL HIGH (ref 5–40)

## 2018-03-27 LAB — POCT GLYCOSYLATED HEMOGLOBIN (HGB A1C): HEMOGLOBIN A1C: 5.7 % — AB (ref 4.0–5.6)

## 2018-03-27 MED ORDER — VALSARTAN-HYDROCHLOROTHIAZIDE 160-25 MG PO TABS: 1.0000 | ORAL_TABLET | Freq: Every day | ORAL | 0 refills | Status: DC

## 2018-03-27 MED ORDER — ALPRAZOLAM 0.5 MG PO TABS: ORAL_TABLET | ORAL | 0 refills | Status: AC

## 2018-03-27 MED ORDER — ARIPIPRAZOLE 5 MG PO TABS: ORAL_TABLET | ORAL | 1 refills | Status: DC

## 2018-03-27 MED ORDER — SUMATRIPTAN SUCCINATE 100 MG PO TABS: ORAL_TABLET | ORAL | 0 refills | Status: DC

## 2018-03-27 MED ORDER — ATORVASTATIN CALCIUM 40 MG PO TABS: ORAL_TABLET | ORAL | 3 refills | Status: AC

## 2018-03-27 MED ORDER — FUROSEMIDE 20 MG PO TABS: ORAL_TABLET | ORAL | 0 refills | Status: AC

## 2018-03-27 MED ORDER — FLUOXETINE HCL 40 MG PO CAPS: 40.0000 mg | ORAL_CAPSULE | Freq: Every day | ORAL | 0 refills | Status: AC

## 2018-03-27 MED ORDER — BUPROPION HCL ER (SR) 200 MG PO TB12: 200.0000 mg | ORAL_TABLET | Freq: Every day | ORAL | 1 refills | Status: AC

## 2018-03-27 MED ORDER — ARIPIPRAZOLE 5 MG PO TABS: ORAL_TABLET | ORAL | 1 refills | Status: AC

## 2018-03-27 MED ORDER — BUPROPION HCL ER (SR) 200 MG PO TB12: 200.0000 mg | ORAL_TABLET | Freq: Every day | ORAL | 1 refills | Status: DC

## 2018-03-27 MED ORDER — VALSARTAN-HYDROCHLOROTHIAZIDE 160-25 MG PO TABS: 1.0000 | ORAL_TABLET | Freq: Every day | ORAL | 1 refills | Status: AC

## 2018-03-27 MED ORDER — CYCLOBENZAPRINE HCL 10 MG PO TABS: 10.0000 mg | ORAL_TABLET | Freq: Three times a day (TID) | ORAL | 0 refills | Status: DC | PRN

## 2018-03-27 MED ORDER — CYCLOBENZAPRINE HCL 10 MG PO TABS: 10.0000 mg | ORAL_TABLET | Freq: Three times a day (TID) | ORAL | 0 refills | Status: AC | PRN

## 2018-03-27 MED ORDER — ATORVASTATIN CALCIUM 40 MG PO TABS: ORAL_TABLET | ORAL | 3 refills | Status: DC

## 2018-03-27 MED ORDER — DICLOFENAC SODIUM 75 MG PO TBEC: 75.0000 mg | DELAYED_RELEASE_TABLET | Freq: Two times a day (BID) | ORAL | 0 refills | Status: AC | PRN

## 2018-03-27 MED ORDER — TRAZODONE HCL 100 MG PO TABS: ORAL_TABLET | ORAL | 0 refills | Status: DC

## 2018-03-27 MED ORDER — FUROSEMIDE 20 MG PO TABS: ORAL_TABLET | ORAL | 0 refills | Status: DC

## 2018-03-27 MED ORDER — ESOMEPRAZOLE MAGNESIUM 20 MG PO CPDR: DELAYED_RELEASE_CAPSULE | ORAL | 0 refills | Status: AC

## 2018-03-27 MED ORDER — ALLOPURINOL 300 MG PO TABS: 300.0000 mg | ORAL_TABLET | Freq: Every day | ORAL | 1 refills | Status: AC

## 2018-03-27 MED ORDER — ALBUTEROL SULFATE HFA 108 (90 BASE) MCG/ACT IN AERS: 2.0000 | INHALATION_SPRAY | RESPIRATORY_TRACT | 3 refills | Status: AC | PRN

## 2018-03-27 MED ORDER — TRAZODONE HCL 100 MG PO TABS: ORAL_TABLET | ORAL | 0 refills | Status: AC

## 2018-03-27 MED ORDER — FLUOXETINE HCL 40 MG PO CAPS: 40.0000 mg | ORAL_CAPSULE | Freq: Every day | ORAL | 0 refills | Status: DC

## 2018-03-27 MED ORDER — SUMATRIPTAN SUCCINATE 100 MG PO TABS: ORAL_TABLET | ORAL | 5 refills | Status: AC

## 2018-03-27 MED ORDER — MOMETASONE FUROATE 50 MCG/ACT NA SUSP: 2.0000 | Freq: Every day | NASAL | 3 refills | Status: AC

## 2018-03-27 MED ORDER — DICLOFENAC SODIUM 75 MG PO TBEC: 75.0000 mg | DELAYED_RELEASE_TABLET | Freq: Two times a day (BID) | ORAL | 0 refills | Status: DC | PRN

## 2018-03-27 NOTE — Progress Notes (Signed)
Subjective:    Patient ID: Melinda Peterson, female    DOB: 1951-07-04, 67 y.o.   MRN: [id number] Chief Complaint  Patient presents with  . Hypertension    follow-up   . Medication Management    HPI  Melinda Peterson is a 67 yo woman who is scheduled today for a BP check due to her h/o HTN  Had coffee today with splenda and 1/2 cup of cream in it.   Not needing albuterol inhaler at all.   No gout flairs recently - her elbow has been looking better since her last visit - doing well on allopurinol 300 which has been keeping uric acid controlled for years.  Lab Results  Component Value Date   LABURIC 4.3 09/30/2017   LABURIC 4.2 10/15/2016   Sleeping well on just taking the alprazolam 1mg  qhs - also taking alprazolam 0.5mg  tid for anxiety control but much better as used to zolpidem as well (??? Double check).  Depression worse - no interest in anything. Anxiety increased because she has so much to do. Abilify does help her sleep more.   Increased neck pain - she is going to a neurologist after she moves which is resulting in more migraines so she she is using the imitrex which works great but needed 3d in a row the past monthl Is going to get her mammogram next week before she moves  Doesb;t know what nasal sprays she is taking but worried about [place]'s allergies so wants a rx for nasonex - has had cataracts removed but no h/o glaucome or other eye problems.  Past Medical History:  Diagnosis Date  . Allergy   . Anxiety   . Arthritis   . Cataract   . Chronic headaches   . Colon polyps   . Depression   . Diverticulosis   . Eczema   . GERD (gastroesophageal reflux disease)   . HTN (hypertension)   . Hyperlipidemia   . Irritable bowel syndrome   . Neuromuscular disorder ([place])   . Osteopenia   . Osteoporosis   . Tobacco use 04/17/2013   Past Surgical History:  Procedure Laterality Date  . acromioclavicularplasty    . BREAST SURGERY Left   . CATARACT EXTRACTION, BILATERAL  2016    . COLONOSCOPY W/ POLYPECTOMY  2009, 2012  . SKIN CANCER EXCISION     lower back  . TOTAL ABDOMINAL HYSTERECTOMY    . TUBAL LIGATION     Current Outpatient Medications on File Prior to Visit  Medication Sig Dispense Refill  . aspirin 81 MG tablet Take 81 mg by mouth daily.    . Melatonin 10 MG CAPS Take 10 mg by mouth at bedtime.    . Multiple Vitamin (MULTIVITAMIN) capsule Take 1 capsule by mouth daily.    Marland Kitchen OVER THE COUNTER MEDICATION OTC Vitamin D 3 2000 mg daily    . Turmeric 500 MG TABS Take by mouth.     No current facility-administered medications on file prior to visit.    Allergies  Allergen Reactions  . Biaxin [Clarithromycin]   . Clarithromycin Hives  . Lincomycin Hcl Hives  . Lisinopril Swelling   Family History  Problem Relation Age of Onset  . Colon polyps Father   . Colon polyps Brother   . Heart disease Mother   . Stroke Mother   . Mental illness Sister        Personality disorder  . Depression Sister   . Valvular heart disease Sister  Social History   Socioeconomic History  . Marital status: Married    Spouse name: Not on file  . Number of children: 1  . Years of education: Not on file  . Highest education level: Not on file  Occupational History  . Occupation: retired  Scientific laboratory technician  . Financial resource strain: Not on file  . Food insecurity:    Worry: Not on file    Inability: Not on file  . Transportation needs:    Medical: Not on file    Non-medical: Not on file  Tobacco Use  . Smoking status: Former Smoker    Packs/day: 0.30    Types: Cigarettes  . Smokeless tobacco: Never Used  Substance and Sexual Activity  . Alcohol use: Yes    Comment: social  . Drug use: No  . Sexual activity: Yes    Partners: Male  Lifestyle  . Physical activity:    Days per week: Not on file    Minutes per session: Not on file  . Stress: Not on file  Relationships  . Social connections:    Talks on phone: Not on file    Gets together: Not on file     Attends religious service: Not on file    Active member of club or organization: Not on file    Attends meetings of clubs or organizations: Not on file    Relationship status: Not on file  Other Topics Concern  . Not on file  Social History Narrative   Married. Patient has a Financial risk analyst. She does not exercise.   Depression screen [facility] 2/9 03/27/2018 12/23/2017 09/30/2017 09/27/2016 05/31/2016  Decreased Interest 1 0 0 0 0  Down, Depressed, Hopeless 1 0 0 0 0  PHQ - 2 Score 2 0 0 0 0  Altered sleeping 0 - - - -  Tired, decreased energy 1 - - - -  Change in appetite 0 - - - -  Feeling bad or failure about yourself  1 - - - -  Trouble concentrating 0 - - - -  Moving slowly or fidgety/restless 0 - - - -  Suicidal thoughts 0 - - - -  PHQ-9 Score 4 - - - -     Review of Systems See hpi    Objective:   Physical Exam  Constitutional: She is oriented to person, place, and time. She appears well-developed and well-nourished. No distress.  HENT:  Head: Normocephalic and atraumatic.  Right Ear: External ear normal.  Left Ear: External ear normal.  Eyes: Conjunctivae are normal. No scleral icterus.  Neck: Normal range of motion. Neck supple. No thyromegaly present.  Cardiovascular: Normal rate, regular rhythm, normal heart sounds and intact distal pulses.  Pulmonary/Chest: Effort normal and breath sounds normal. No respiratory distress.  Musculoskeletal: She exhibits no edema.  Lymphadenopathy:    She has no cervical adenopathy.  Neurological: She is alert and oriented to person, place, and time.  Skin: Skin is warm and dry. She is not diaphoretic. No erythema.  Psychiatric: She has a normal mood and affect. Her behavior is normal.      BP 118/76   Pulse (!) 116   Temp 98 F (36.7 C) (Oral)   Resp 16   Ht 5' 2.99" (1.6 m)   Wt 205 lb (93 kg)   SpO2 95%   BMI 36.32 kg/m      Assessment & Plan:   1. Essential hypertension   2. Hyperlipidemia, mild   3. Gout  of  right foot, unspecified cause, unspecified chronicity   4. Osteoporosis with current pathological fracture with nonunion, unspecified osteoporosis type, subsequent encounter   5. Recurrent major depressive disorder, in partial remission ([place])   6. Insomnia, unspecified type   7. Anxiety   8. Metabolic syndrome X   9. Prediabetes   10. Elevated blood sugar    E-rxds scripts for 59-month supply for patient to fill locally.  Gave patient hard scripts for second 61-month supply for her to bring with her and fill after her move to [place].  Together this should not give her 6 months which is a more than adequate time to get an appointment to establish care with the new physician that they have chosen.  Orders Placed This Encounter  Procedures  . Comprehensive metabolic panel    Order Specific Question:   Has the patient fasted?    Answer:   Yes  . Lipid panel    Order Specific Question:   Has the patient fasted?    Answer:   Yes  . LDL Cholesterol, Direct  . POCT glycosylated hemoglobin (Hb A1C)    Meds ordered this encounter  Medications  . albuterol (PROVENTIL HFA;VENTOLIN HFA) 108 (90 Base) MCG/ACT inhaler    Sig: Inhale 2 puffs into the lungs every 4 (four) hours as needed for wheezing or shortness of breath (cough, shortness of breath or wheezing.).    Dispense:  1 Inhaler    Refill:  3  . allopurinol (ZYLOPRIM) 300 MG tablet    Sig: Take 1 tablet (300 mg total) by mouth daily.    Dispense:  90 tablet    Refill:  1  . ALPRAZolam (XANAX) 0.5 MG tablet    Sig: TAKE 1 TABLET BY MOUTH THREE TIMES DAILY AS NEEDED AND 2 TABLETS EVERY NIGHT AT BEDTIME FOR SLEEP    Dispense:  450 tablet    Refill:  0    THIS IS A 3 MONTH SUPPLY.  Marland Kitchen ALPRAZolam (XANAX) 0.5 MG tablet    Sig: TAKE 1 TABLET BY MOUTH THREE TIMES DAILY AS NEEDED AND 2 TABLETS EVERY NIGHT AT BEDTIME FOR SLEEP    Dispense:  450 tablet    Refill:  0    THIS IS A 3 MONTH SUPPLY.  . DISCONTD: ARIPiprazole (ABILIFY) 5 MG tablet     Sig: TAKE 1 TABLET(5 MG) BY MOUTH QHS    Dispense:  90 tablet    Refill:  1    **Patient requests 90 days supply**  . DISCONTD: buPROPion (WELLBUTRIN SR) 200 MG 12 hr tablet    Sig: Take 1 tablet (200 mg total) by mouth daily.    Dispense:  90 tablet    Refill:  1  . ARIPiprazole (ABILIFY) 5 MG tablet    Sig: TAKE 1 TABLET(5 MG) BY MOUTH QHS    Dispense:  90 tablet    Refill:  1    **Patient requests 90 days supply**  . buPROPion (WELLBUTRIN SR) 200 MG 12 hr tablet    Sig: Take 1 tablet (200 mg total) by mouth daily.    Dispense:  90 tablet    Refill:  1  . DISCONTD: atorvastatin (LIPITOR) 40 MG tablet    Sig: TAKE 1 TABLET BY MOUTH DAILY AT 6 PM    Dispense:  90 tablet    Refill:  3    **Patient requests 90 days supply**  . atorvastatin (LIPITOR) 40 MG tablet    Sig: TAKE 1 TABLET BY  MOUTH DAILY AT 6 PM    Dispense:  90 tablet    Refill:  3    **Patient requests 90 days supply**  . DISCONTD: cyclobenzaprine (FLEXERIL) 10 MG tablet    Sig: Take 1 tablet (10 mg total) by mouth 3 (three) times daily as needed for muscle spasms.    Dispense:  180 tablet    Refill:  0  . DISCONTD: diclofenac (VOLTAREN) 75 MG EC tablet    Sig: Take 1 tablet (75 mg total) by mouth 2 (two) times daily as needed for moderate pain.    Dispense:  180 tablet    Refill:  0  . cyclobenzaprine (FLEXERIL) 10 MG tablet    Sig: Take 1 tablet (10 mg total) by mouth 3 (three) times daily as needed for muscle spasms.    Dispense:  180 tablet    Refill:  0  . diclofenac (VOLTAREN) 75 MG EC tablet    Sig: Take 1 tablet (75 mg total) by mouth 2 (two) times daily as needed for moderate pain.    Dispense:  180 tablet    Refill:  0  . DISCONTD: FLUoxetine (PROZAC) 40 MG capsule    Sig: Take 1 capsule (40 mg total) by mouth daily.    Dispense:  90 capsule    Refill:  0  . esomeprazole (NEXIUM) 20 MG capsule    Sig: TAKE ONE CAPSULE BY MOUTH TWICE DAILY BEFORE MEALS AS NEEDED FOR ACID REFLUX AND INDIGESTION     Dispense:  180 capsule    Refill:  0  . FLUoxetine (PROZAC) 40 MG capsule    Sig: Take 1 capsule (40 mg total) by mouth daily.    Dispense:  90 capsule    Refill:  0  . DISCONTD: furosemide (LASIX) 20 MG tablet    Sig: TAKE 1 TABLET(20 MG) BY MOUTH DAILY AS NEEDED FOR SWELLING    Dispense:  90 tablet    Refill:  0    **Patient requests 90 days supply**  . furosemide (LASIX) 20 MG tablet    Sig: TAKE 1 TABLET(20 MG) BY MOUTH DAILY AS NEEDED FOR SWELLING    Dispense:  90 tablet    Refill:  0    **Patient requests 90 days supply**  . mometasone (NASONEX) 50 MCG/ACT nasal spray    Sig: Place 2 sprays into the nose at bedtime.    Dispense:  17 g    Refill:  3  . DISCONTD: SUMAtriptan (IMITREX) 100 MG tablet    Sig: TAKE 1 TABLET BY MOUTH EVERY 2 HOURS AS NEEDED FOR MIGRAINE    Dispense:  30 tablet    Refill:  0    THIS IS A 90 DAY SUPPLY SINCE PT IS MOVING OUT OF STATE AND NEEDS TO STOCK UP ON MEDICATION BEFORE TRANSFERRING CARE  . SUMAtriptan (IMITREX) 100 MG tablet    Sig: TAKE 1 TABLET BY MOUTH EVERY 2 HOURS AS NEEDED FOR MIGRAINE    Dispense:  10 tablet    Refill:  5    THIS IS A 90 DAY SUPPLY SINCE PT IS MOVING OUT OF STATE AND NEEDS TO STOCK UP ON MEDICATION BEFORE TRANSFERRING CARE  . DISCONTD: traZODone (DESYREL) 100 MG tablet    Sig: TAKE 1 TABLET(100 MG) BY MOUTH AT BEDTIME AS NEEDED FOR SLEEP    Dispense:  90 tablet    Refill:  0  . traZODone (DESYREL) 100 MG tablet    Sig: TAKE 1 TABLET(100 MG) BY  MOUTH AT BEDTIME AS NEEDED FOR SLEEP    Dispense:  90 tablet    Refill:  0  . DISCONTD: valsartan-hydrochlorothiazide (DIOVAN-HCT) 160-25 MG tablet    Sig: Take 1 tablet by mouth daily.    Dispense:  90 tablet    Refill:  0  . valsartan-hydrochlorothiazide (DIOVAN-HCT) 160-25 MG tablet    Sig: Take 1 tablet by mouth daily.    Dispense:  90 tablet    Refill:  1    [name], MD, MPH Primary Care at [facility] [place], [place]   [phone] Office phone  [phone] Office fax   05/20/18 3:51 PM

## 2018-03-27 NOTE — Patient Instructions (Signed)
     I will contact you with your lab results within the next 2 weeks.  If you have not heard from us then please contact us. The fastest way to get your results is to register for My Chart.   IF you received an x-ray today, you will receive an invoice from [facility]. Please contact [facility] at [phone] with questions or concerns regarding your invoice.   IF you received labwork today, you will receive an invoice from [facility]. Please contact [facility] at [phone] with questions or concerns regarding your invoice.   Our billing staff will not be able to assist you with questions regarding bills from these companies.  You will be contacted with the lab results as soon as they are available. The fastest way to get your results is to activate your My Chart account. Instructions are located on the last page of this paperwork. If you have not heard from us regarding the results in 2 weeks, please contact this office.     

## 2018-03-28 LAB — LDL CHOLESTEROL, DIRECT: LDL DIRECT: 89 mg/dL (ref 0–99)

## 2018-03-31 ENCOUNTER — Ambulatory Visit: Payer: Medicare Other | Admitting: Family Medicine

## 2018-04-01 ENCOUNTER — Telehealth: Payer: Self-pay | Admitting: Family Medicine

## 2018-04-01 NOTE — Telephone Encounter (Signed)
The pt has a RX for Alprazolam that has been denied. When submitting the information to try for approval, it is asking what meds that have been tried that have failed. I do not have the answers tho this. I have placed the paper in [facility] box. Please advise.  Thank you!

## 2018-04-02 ENCOUNTER — Ambulatory Visit: Payer: Medicare Other

## 2018-04-02 ENCOUNTER — Ambulatory Visit
Admission: RE | Admit: 2018-04-02 | Discharge: 2018-04-02 | Disposition: A | Discharge status: Home / Self Care | Payer: Medicare Other | Source: Ambulatory Visit | Attending: Family Medicine | Admitting: Family Medicine

## 2018-04-02 DIAGNOSIS — Z1231 Encounter for screening mammogram for malignant neoplasm of breast: Secondary | ICD-10-CM

## 2018-04-03 ENCOUNTER — Telehealth: Payer: Self-pay | Admitting: Family Medicine

## 2018-04-03 NOTE — Telephone Encounter (Signed)
Attempted to call the patient twice, to let the patient know that her medication (Alprazolam) was approved by her insurance company through 08/12/2018. Her phone would only ring once and it would hang up on me.

## 2018-04-07 ENCOUNTER — Telehealth: Payer: Self-pay | Admitting: Family Medicine

## 2018-04-07 NOTE — Telephone Encounter (Signed)
Copied from [place] [id number]. Topic: Quick Communication - Other Results >> Apr 07, 2018 12:19 PM Oliver Pila B wrote: Pt called to get results for her mammogram; contact

## 2018-05-08 ENCOUNTER — Telehealth: Payer: Self-pay | Admitting: Family Medicine

## 2018-05-08 NOTE — Telephone Encounter (Signed)
Copied from [place] [id number]. 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 Patient is trying to get Rx filled for ALPRAZolam [name]) 0.5 MG tablet The Rx was sent to pharmacy her in [place] but patient never received this medication and per [facility] this Rx can not be transferred. Requesting a new Rx sent to Pharmacy indicated below or a verbal order to fill. Ph# [phone]  [place] [address] [place]

## 2018-05-20 NOTE — Telephone Encounter (Signed)
She should be able to see it on my chart.  It was normal.  CLINICAL DATA:  Screening.  EXAM: DIGITAL SCREENING BILATERAL MAMMOGRAM WITH TOMO AND CAD  COMPARISON:  Previous exam(s).  ACR Breast Density Category b: There are scattered areas of fibroglandular density.  FINDINGS: There are no findings suspicious for malignancy. Images were processed with CAD.  IMPRESSION: No mammographic evidence of malignancy. A result letter of this screening mammogram will be mailed directly to the patient.  RECOMMENDATION: Screening mammogram in one year. (Code:SM-B-01Y)  BI-RADS CATEGORY  1: Negative.

## 2018-05-20 NOTE — Telephone Encounter (Addendum)
Me neither.  You are going to have to call and ask the patient.  It appears that she has been on alprazolam long before I inherited her and long before we started on epic.  When you speak to her, it might be helpful for her to know if there are specific other things they want her to have tried and failed rather than asking her what other medication she has been tried on for sleep and failed as I imagine it will be difficult for her to recall prior names since they were prescribed likely well over a decade ago, if any ever were.  However, you can see in her med list that in addition to alprazolam, she is currently being rx'd/taking Wellbutrin SR 200, Prozac 40, trazodone 100, Abilify 5, and Flexeril 10. Also, she was previously on zolpidem 10 but this was recently discontinued as I was concerned about her mixing it with the alprazolam -and the alprazolam was effective for both her anxiety and insomnia

## 2018-05-20 NOTE — Telephone Encounter (Addendum)
Yes, please call and give verbal.  In my last note, I tried my best to make it very clear that patient was moving to [place] and so I gave her printed scripts of all of her medications so that she could get them filled when she got there.    However, on reviewing the chart further it looks like perhaps the problem was that a prior authorization needed by her insurance was never completed and that is why she could not fill the prescription initially in [place].    I reviewed the controlled substance database for [facility] which shows that her last 30-day alprazolam fill was 8/1 from a prescription written 7/11.  I do not know if patient changed insurances very recently or if it was the fact that I wrote for a 90-day supply rather than a 30-day that is why she now requires a prior authorization

## 2018-05-21 NOTE — Telephone Encounter (Signed)
Called and gave verbal order for Rx Alprazolam, pharmacist verbalized understanding.

## 2018-05-26 ENCOUNTER — Encounter: Payer: Self-pay | Admitting: *Deleted

## 2018-05-26 NOTE — Telephone Encounter (Signed)
This medication (Alprazolam) was approved by her insurance company on September 30.

## 2018-11-05 IMAGING — MG DIGITAL SCREENING BILATERAL MAMMOGRAM WITH TOMO AND CAD
8 series · 8 of 24 positions shown · non-contrast
Comparison: Previous exam(s).

CLINICAL DATA: Screening.

EXAM:
DIGITAL SCREENING BILATERAL MAMMOGRAM WITH TOMO AND CAD

[R MLO synth-2D]
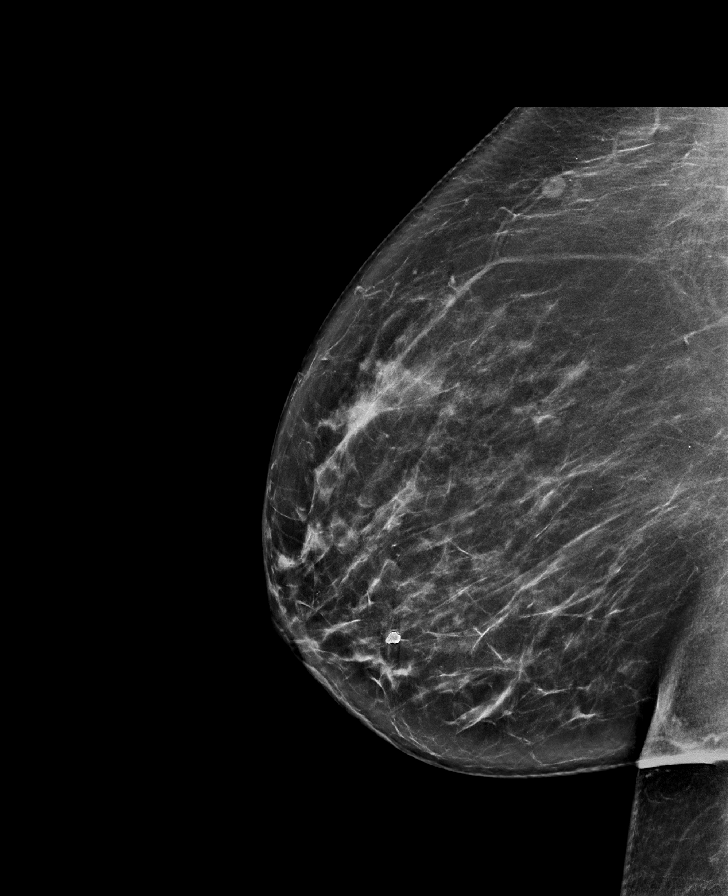

[L MLO synth-2D]
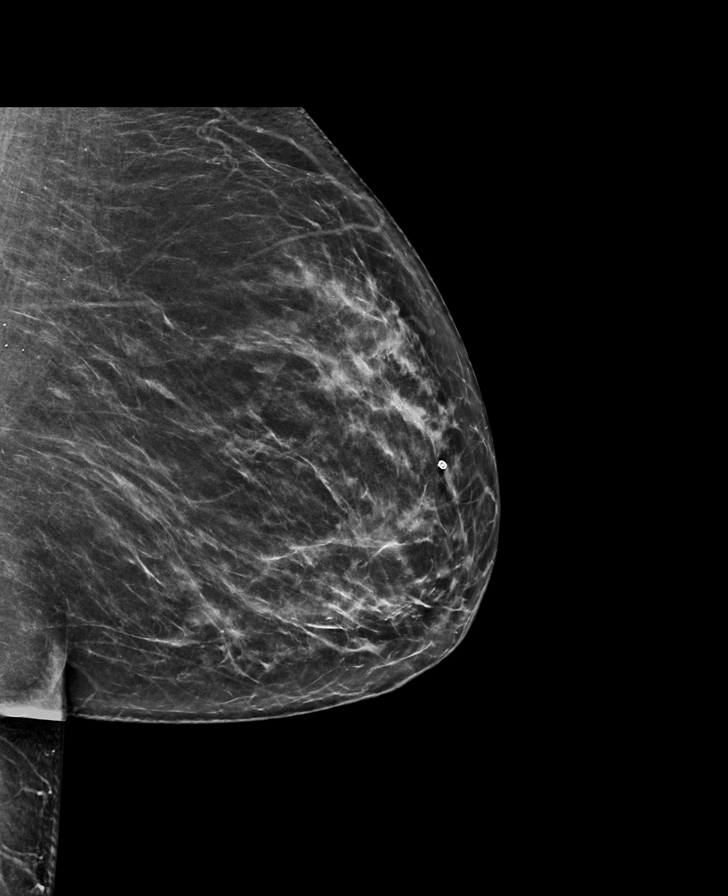

[R CC synth-2D]
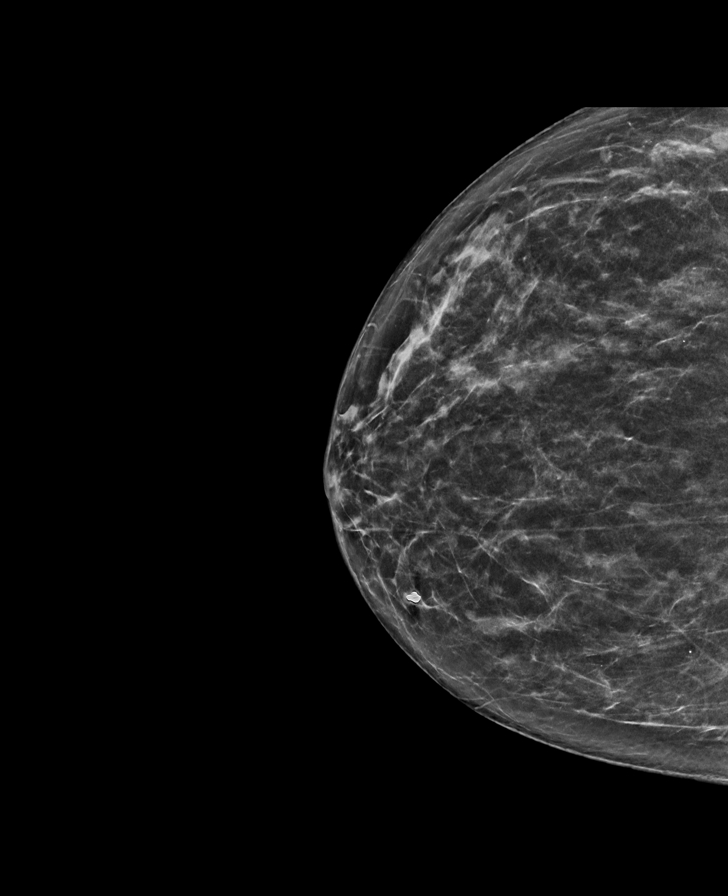

[L CC synth-2D]
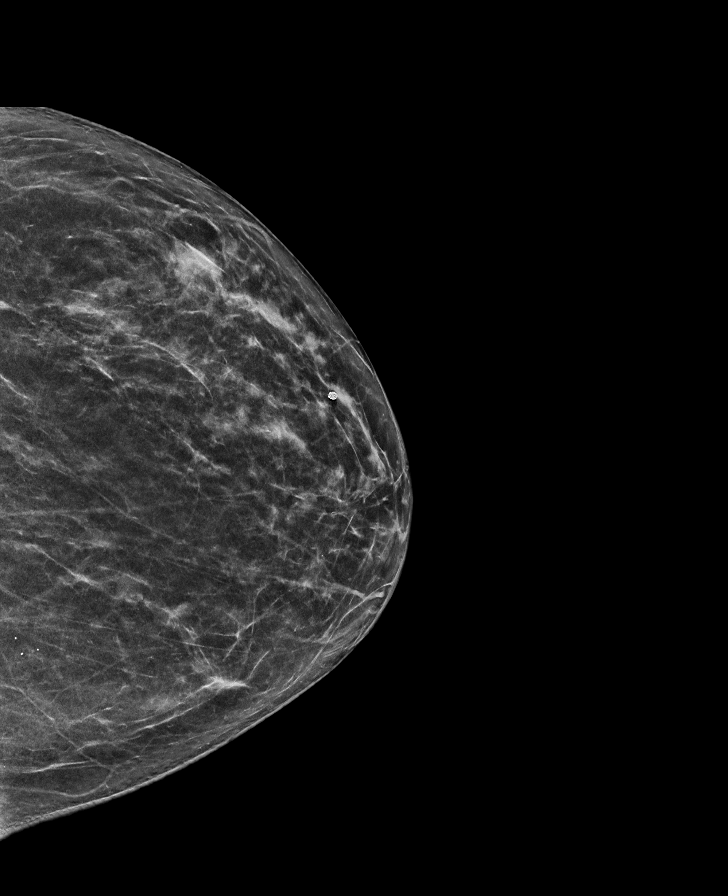

[R MLO tomo · tomo slice 43/86.0]
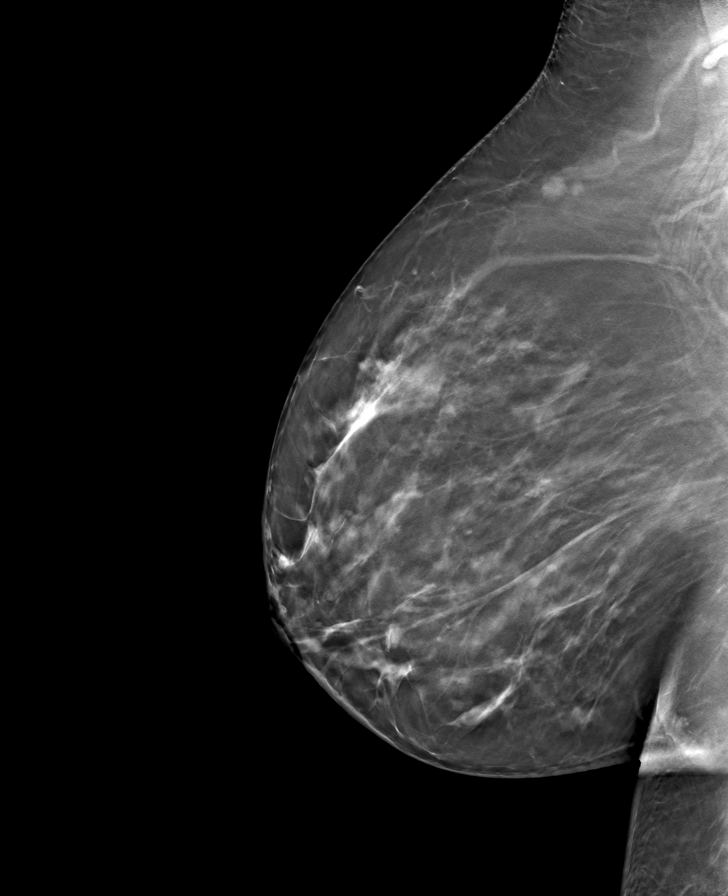

[L CC tomo · tomo slice 35/69.0]
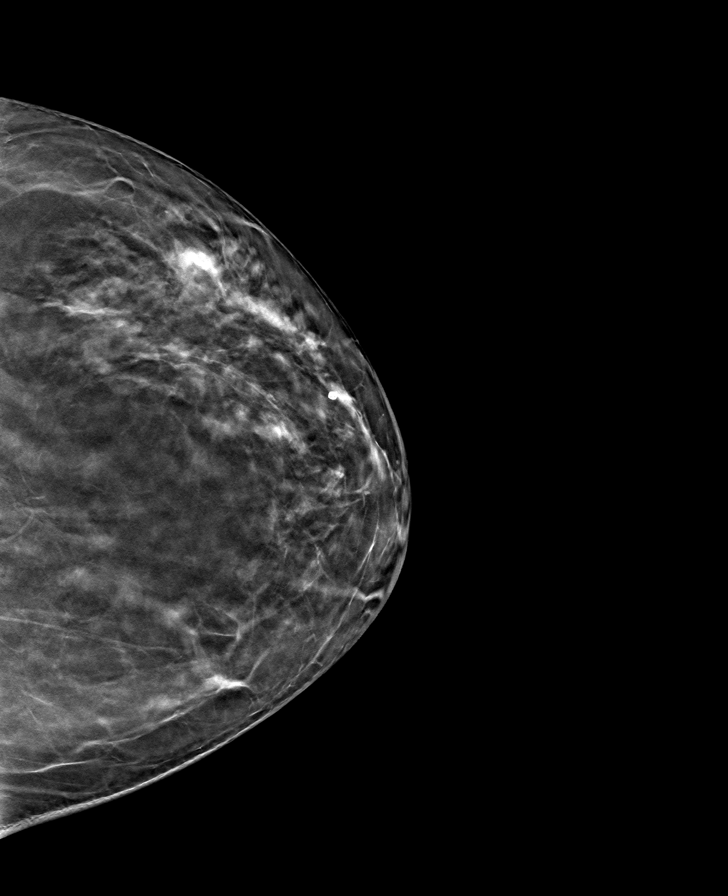

[L MLO tomo · tomo slice 38/75.0]
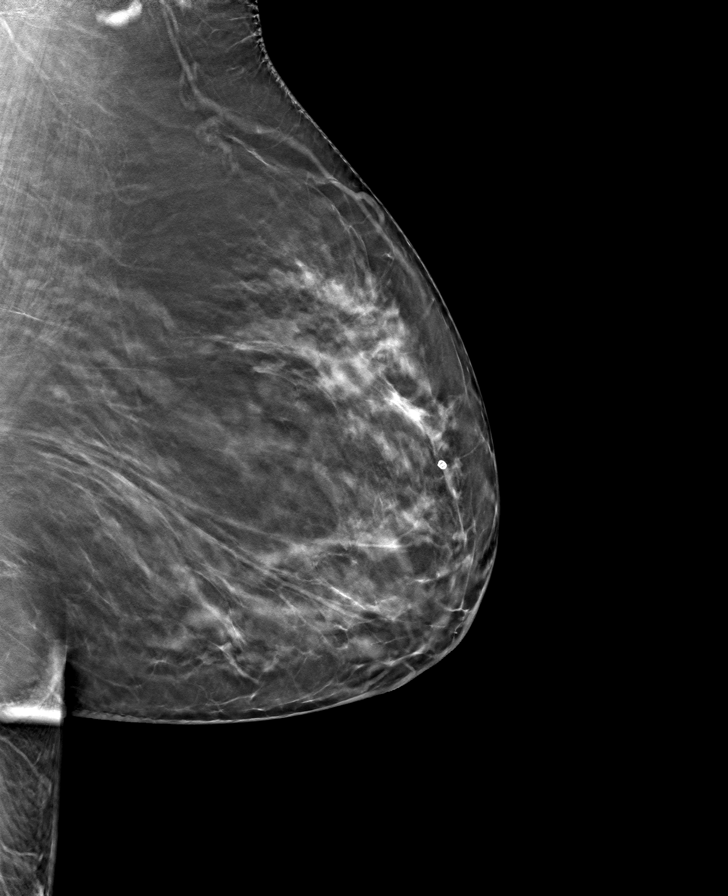

[R CC tomo · tomo slice 35/69.0]
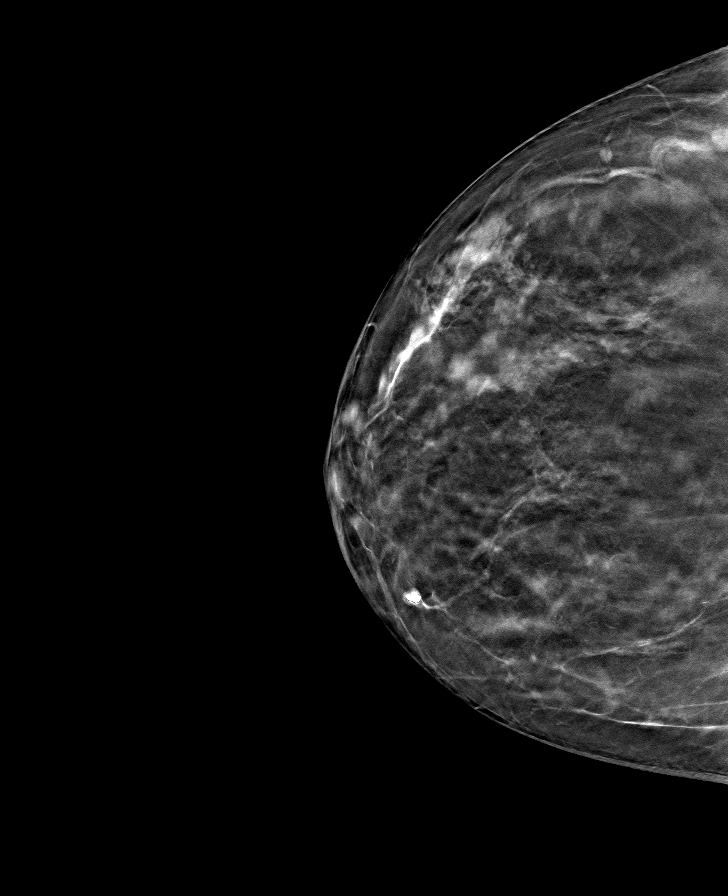

[8 of 24 positions shown; findings below may reference images not displayed]

ACR Breast Density Category b: There are scattered areas of
fibroglandular density.
FINDINGS: There are no findings suspicious for malignancy. Images were
processed with CAD.
IMPRESSION: No mammographic evidence of malignancy. A result letter of this
screening mammogram will be mailed directly to the patient.

RECOMMENDATION:
Screening mammogram in one year. (Code:[id number])

BI-RADS CATEGORY  1: Negative.
